# Patient Record
Sex: Female | Born: 1985 | Race: Black or African American | Hispanic: No | Marital: Single | Smoking: Never smoker
Health system: Southern US, Community
[De-identification: ages and names within clinical notes are randomized; demographics above are authoritative.]

## PROBLEM LIST (undated history)

## (undated) ENCOUNTER — Inpatient Hospital Stay (HOSPITAL_COMMUNITY): Payer: Self-pay

## (undated) DIAGNOSIS — K219 Gastro-esophageal reflux disease without esophagitis: Secondary | ICD-10-CM

## (undated) DIAGNOSIS — F419 Anxiety disorder, unspecified: Secondary | ICD-10-CM

## (undated) DIAGNOSIS — D649 Anemia, unspecified: Secondary | ICD-10-CM

## (undated) DIAGNOSIS — I499 Cardiac arrhythmia, unspecified: Secondary | ICD-10-CM

## (undated) HISTORY — DX: Anxiety disorder, unspecified: F41.9

## (undated) HISTORY — PX: HERNIA REPAIR: SHX51

---

## 2010-09-24 ENCOUNTER — Ambulatory Visit (HOSPITAL_COMMUNITY)
Admission: RE | Admit: 2010-09-24 | Discharge: 2010-09-24 | Payer: Self-pay | Source: Home / Self Care | Attending: Obstetrics | Admitting: Obstetrics

## 2011-01-16 ENCOUNTER — Inpatient Hospital Stay (INDEPENDENT_AMBULATORY_CARE_PROVIDER_SITE_OTHER)
Admission: RE | Admit: 2011-01-16 | Discharge: 2011-01-16 | Disposition: A | Discharge status: Home / Self Care | Payer: Medicaid Other | Source: Ambulatory Visit | Attending: Family Medicine | Admitting: Family Medicine

## 2011-01-16 DIAGNOSIS — K089 Disorder of teeth and supporting structures, unspecified: Secondary | ICD-10-CM

## 2011-01-16 DIAGNOSIS — Z331 Pregnant state, incidental: Secondary | ICD-10-CM

## 2011-02-09 ENCOUNTER — Other Ambulatory Visit (HOSPITAL_COMMUNITY): Payer: Medicaid Other

## 2011-02-11 ENCOUNTER — Other Ambulatory Visit (HOSPITAL_COMMUNITY): Payer: Medicaid Other

## 2011-02-15 ENCOUNTER — Other Ambulatory Visit: Payer: Self-pay | Admitting: Obstetrics

## 2011-02-15 ENCOUNTER — Encounter (HOSPITAL_COMMUNITY): Payer: Medicaid Other

## 2011-02-15 LAB — CBC
MCH: 26.5 pg (ref 26.0–34.0)
MCHC: 31 g/dL (ref 30.0–36.0)
MCV: 85.6 fL (ref 78.0–100.0)
Platelets: 123 10*3/uL — ABNORMAL LOW (ref 150–400)
RDW: 16.3 % — ABNORMAL HIGH (ref 11.5–15.5)

## 2011-02-15 LAB — SURGICAL PCR SCREEN: MRSA, PCR: NEGATIVE

## 2011-02-16 ENCOUNTER — Inpatient Hospital Stay (HOSPITAL_COMMUNITY)
Admission: RE | Admit: 2011-02-16 | Discharge: 2011-02-18 | DRG: 766 | Disposition: A | Discharge status: Home / Self Care | Payer: Medicaid Other | Source: Ambulatory Visit | Attending: Obstetrics | Admitting: Obstetrics

## 2011-02-16 DIAGNOSIS — Z01812 Encounter for preprocedural laboratory examination: Secondary | ICD-10-CM

## 2011-02-16 DIAGNOSIS — Z01818 Encounter for other preprocedural examination: Secondary | ICD-10-CM

## 2011-02-16 DIAGNOSIS — O34219 Maternal care for unspecified type scar from previous cesarean delivery: Principal | ICD-10-CM | POA: Diagnosis present

## 2011-02-17 LAB — CBC
HCT: 29.2 % — ABNORMAL LOW (ref 36.0–46.0)
Hemoglobin: 9.2 g/dL — ABNORMAL LOW (ref 12.0–15.0)
MCH: 26.7 pg (ref 26.0–34.0)
MCHC: 31.5 g/dL (ref 30.0–36.0)
MCV: 84.6 fL (ref 78.0–100.0)

## 2011-02-18 ENCOUNTER — Inpatient Hospital Stay (INDEPENDENT_AMBULATORY_CARE_PROVIDER_SITE_OTHER)
Admission: RE | Admit: 2011-02-18 | Discharge: 2011-02-18 | Disposition: A | Discharge status: Home / Self Care | Payer: Medicaid Other | Source: Ambulatory Visit | Attending: Family Medicine | Admitting: Family Medicine

## 2011-02-18 DIAGNOSIS — G8918 Other acute postprocedural pain: Secondary | ICD-10-CM

## 2011-02-23 NOTE — Discharge Summary (Signed)
  NAMEALLISON, Mia Wilkinson NO.:  [id number]  MEDICAL RECORD NO.:  [id number]  LOCATION:  9105                          FACILITY:  [facility]  PHYSICIAN:  Kathreen Cosier, M.D.DATE OF BIRTH:  10-Mar-1986  DATE OF ADMISSION:  02/16/2011 DATE OF DISCHARGE:                              DISCHARGE SUMMARY   The patient is a 25 year old gravida 3, para 2-0-2, EDC is February 22, 2011.  She had 2 previous C-sections and she was now at term and desired repeat C-section.  She underwent a repeat low-transverse cesarean section and had a female Apgar 8 and 9, weighing 6 pounds 2 ounces. Postoperatively, she did well.  Her hemoglobin was 9.2.  She wanted earlier discharge on the second postoperative day and she was discharged on the second postoperative day on Tylox for pain and lower extremity for contraception.  DISCHARGE DIAGNOSES:  Status post repeat low transverse cesarean section at term.          ______________________________ Kathreen Cosier, M.D.     BAM/MEDQ  D:  02/18/2011  T:  02/18/2011  Job:  [id number]  Electronically Signed by Francoise Ceo M.D. on 02/23/2011 09:01:10 AM

## 2011-02-23 NOTE — Op Note (Signed)
  NAMEANYELI, Mia Wilkinson NO.:  [id number]  MEDICAL RECORD NO.:  [id number]  LOCATION:  9105                          FACILITY:  [facility]  PHYSICIAN:  Kathreen Cosier, M.D.DATE OF BIRTH:  07-26-1986  DATE OF PROCEDURE:  02/16/2011 DATE OF DISCHARGE:                              OPERATIVE REPORT   PREOPERATIVE DIAGNOSIS:  Previous cesarean section x2 at term, desires repeat.  POSTOPERATIVE DIAGNOSIS:  Previous cesarean section x2 at term, desires repeat.  SURGEON:  Kathreen Cosier, MD  FIRST ASSISTANT:  Dr. Clearance Coots.  ANESTHESIA:  Spinal.  PROCEDURE:  The patient was placed in the operating room table in supine position after spinal administered.  Abdomen was prepped and draped. The bladder was emptied with Foley catheter.  Transverse suprapubic incision was made through the old scar, carried down to rectus fascia. Fascia cleanly incised through the length of incision.  Recti muscles retracted laterally.  Peritoneum was incised longitudinally.  Transverse incision was made in the visceral peritoneum above the bladder.  Bladder was mobilized inferiorly.  Transverse lower uterine was incision made. Fluid was clear.  Baby delivered from the LOA position.  A female Apgar 8 and 9 weighing 6 pounds 2 ounces.  The placenta was posterior and removed manually, sent to Labor and Delivery.  The uterine cavity was cleaned with dry laps.  Uterine incision was closed in one layer with continuous suture of #1 chromic.  Hemostasis satisfactory.  Bladder flap reattached 2-0 chromic.  Uterus well contracted.  Tubes and ovaries were normal.  Abdomen closed in layers.  Peritoneum continuous suture of 0 chromic.  Fascia continuous suture of 0 Dexon, skin closed with subcuticular stitch of 4-0 Monocryl.  Blood loss 700 mL.  The patient tolerated the procedure well, taken to recovery room in good condition.          ______________________________ Kathreen Cosier,  M.D.     BAM/MEDQ  D:  02/16/2011  T:  02/17/2011  Job:  [id number]  Electronically Signed by Francoise Ceo M.D. on 02/23/2011 09:01:07 AM

## 2011-03-28 ENCOUNTER — Other Ambulatory Visit: Payer: Self-pay | Admitting: Obstetrics

## 2011-03-28 DIAGNOSIS — N6311 Unspecified lump in the right breast, upper outer quadrant: Secondary | ICD-10-CM

## 2011-04-01 ENCOUNTER — Other Ambulatory Visit: Payer: Medicaid Other

## 2012-11-01 ENCOUNTER — Emergency Department (HOSPITAL_COMMUNITY)
Admission: EM | Admit: 2012-11-01 | Discharge: 2012-11-01 | Disposition: A | Discharge status: Home / Self Care | Payer: Self-pay | Attending: Emergency Medicine | Admitting: Emergency Medicine

## 2012-11-01 ENCOUNTER — Encounter (HOSPITAL_COMMUNITY): Payer: Self-pay

## 2012-11-01 DIAGNOSIS — B373 Candidiasis of vulva and vagina: Secondary | ICD-10-CM

## 2012-11-01 DIAGNOSIS — B3731 Acute candidiasis of vulva and vagina: Secondary | ICD-10-CM | POA: Insufficient documentation

## 2012-11-01 DIAGNOSIS — Z3202 Encounter for pregnancy test, result negative: Secondary | ICD-10-CM | POA: Insufficient documentation

## 2012-11-01 DIAGNOSIS — N899 Noninflammatory disorder of vagina, unspecified: Secondary | ICD-10-CM | POA: Insufficient documentation

## 2012-11-01 LAB — URINALYSIS, ROUTINE W REFLEX MICROSCOPIC
Bilirubin Urine: NEGATIVE
Glucose, UA: NEGATIVE mg/dL
Hgb urine dipstick: NEGATIVE
Ketones, ur: NEGATIVE mg/dL
Nitrite: NEGATIVE
Protein, ur: NEGATIVE mg/dL
Specific Gravity, Urine: 1.025 (ref 1.005–1.030)
Urobilinogen, UA: 1 mg/dL (ref 0.0–1.0)
pH: 7.5 (ref 5.0–8.0)

## 2012-11-01 LAB — URINE MICROSCOPIC-ADD ON

## 2012-11-01 LAB — WET PREP, GENITAL
Clue Cells Wet Prep HPF POC: NONE SEEN
Trich, Wet Prep: NONE SEEN
Yeast Wet Prep HPF POC: NONE SEEN

## 2012-11-01 LAB — POCT PREGNANCY, URINE: Preg Test, Ur: NEGATIVE

## 2012-11-01 MED ORDER — FLUCONAZOLE 150 MG PO TABS: 150.0000 mg | ORAL_TABLET | Freq: Once | ORAL | Status: AC

## 2012-11-01 MED ORDER — FLUCONAZOLE 200 MG PO TABS
200.0000 mg | ORAL_TABLET | Freq: Once | ORAL | Status: AC
  Administered 2012-11-01: 200 mg via ORAL
  Filled 2012-11-01: qty 1

## 2012-11-01 NOTE — ED Notes (Signed)
Pt states she has dysuria. Pt denies vaginal d/c. Pt states her perineal area is irritated. States she may have washed too hard there while in the shower. Pt states her pain is in her vagina area and increases whenever she uses the bathroom.

## 2012-11-01 NOTE — ED Notes (Signed)
Patient c/o lower abdominal pain and vaginal irritation x 3 days. Patient denies dysuria.

## 2012-11-01 NOTE — ED Provider Notes (Signed)
History    27 year old female with vaginal irritation. Gradual onset about 3 days ago. Patient describes a sensation of burning when she urinates, and pacer wires. No unusual vaginal bleeding or discharge. LMP was first week in January. Patient is sexually active. She has no particular concerns or possible STD. Denies abdominal or back pain. No fevers or chills. Denies or vomiting. CSN: [id number]  Arrival date & time 11/01/12  1332   First MD Initiated Contact with Patient 11/01/12 1520      Chief Complaint  Patient presents with  . Abdominal Pain  . vaginal irritation     (Consider location/radiation/quality/duration/timing/severity/associated sxs/prior treatment) HPI  History reviewed. No pertinent past medical history.  Past Surgical History  Procedure Laterality Date  . Cesarean section      x 3    Family History  Problem Relation Age of Onset  . Hypertension Mother     History  Substance Use Topics  . Smoking status: Never Smoker   . Smokeless tobacco: Never Used  . Alcohol Use: No    OB History   Grav Para Term Preterm Abortions TAB SAB Ect Mult Living                  Review of Systems  All systems reviewed and negative, other than as noted in HPI.   Allergies  Review of patient's allergies indicates no known allergies.  Home Medications   Current Outpatient Rx  Name  Route  Sig  Dispense  Refill  . OVER THE COUNTER MEDICATION   Oral   Take 1 tablet by mouth once. OTC Bacterial Vaginosis pill.           BP 102/71  Pulse 87  Temp(Src) 98.7 F (37.1 C) (Oral)  Resp 16  SpO2 100%  LMP 09/05/2012  Physical Exam  Nursing note and vitals reviewed. Constitutional: She appears well-developed and well-nourished. No distress.  HENT:  Head: Normocephalic and atraumatic.  Eyes: Conjunctivae are normal. Right eye exhibits no discharge. Left eye exhibits no discharge.  Neck: Neck supple.  Cardiovascular: Normal rate, regular rhythm and normal  heart sounds.  Exam reveals no gallop and no friction rub.   No murmur heard. Pulmonary/Chest: Effort normal and breath sounds normal. No respiratory distress.  Abdominal: Soft. She exhibits no distension. There is no tenderness.  Genitourinary:  Normal external female genitalia. No concerning lesions noted. Thick clumpy white discharge consistent with vulvovaginal candidiasis.   Musculoskeletal: She exhibits no edema and no tenderness.  Neurological: She is alert.  Skin: Skin is warm and dry.  Psychiatric: She has a normal mood and affect. Her behavior is normal. Thought content normal.    ED Course  Procedures (including critical care time)  Labs Reviewed  WET PREP, GENITAL - Abnormal; Notable for the following:    WBC, Wet Prep HPF POC MANY (*)    All other components within normal limits  URINALYSIS, ROUTINE W REFLEX MICROSCOPIC - Abnormal; Notable for the following:    APPearance CLOUDY (*)    Leukocytes, UA LARGE (*)    All other components within normal limits  GC/CHLAMYDIA PROBE AMP  URINE CULTURE  RPR  URINE MICROSCOPIC-ADD ON  POCT PREGNANCY, URINE   No results found.   1. Candidal vulvovaginitis       MDM  27 year old female with vaginal irritation. Exam consistent with a candidal although vaginitis. Patient did not specifically concerned for sexually transmitted disease. Referred from empiric treatment. Cultures were sent. Plan course of  Diflucan. Emergent return precautions discussed.        Raeford Razor, MD 11/03/12 [phone]

## 2012-11-02 LAB — GC/CHLAMYDIA PROBE AMP
CT Probe RNA: NEGATIVE
GC Probe RNA: NEGATIVE

## 2012-11-02 LAB — RPR: RPR Ser Ql: NONREACTIVE

## 2012-11-03 LAB — URINE CULTURE: Colony Count: 60000

## 2012-11-06 ENCOUNTER — Telehealth (HOSPITAL_COMMUNITY): Payer: Self-pay | Admitting: Emergency Medicine

## 2012-11-06 NOTE — ED Notes (Signed)
Pt called to check on STD results.  Pt informed results are negative.

## 2012-11-26 ENCOUNTER — Encounter (HOSPITAL_COMMUNITY): Payer: Self-pay | Admitting: Emergency Medicine

## 2012-11-26 ENCOUNTER — Emergency Department (HOSPITAL_COMMUNITY)
Admission: EM | Admit: 2012-11-26 | Discharge: 2012-11-26 | Disposition: A | Discharge status: Home / Self Care | Payer: Self-pay | Attending: Emergency Medicine | Admitting: Emergency Medicine

## 2012-11-26 DIAGNOSIS — K089 Disorder of teeth and supporting structures, unspecified: Secondary | ICD-10-CM | POA: Insufficient documentation

## 2012-11-26 DIAGNOSIS — K0889 Other specified disorders of teeth and supporting structures: Secondary | ICD-10-CM

## 2012-11-26 MED ORDER — HYDROCODONE-ACETAMINOPHEN 5-325 MG PO TABS: ORAL_TABLET | ORAL | Status: DC

## 2012-11-26 MED ORDER — PENICILLIN V POTASSIUM 500 MG PO TABS: 500.0000 mg | ORAL_TABLET | Freq: Four times a day (QID) | ORAL | Status: DC

## 2012-11-26 NOTE — ED Notes (Signed)
Patient reports dental pain x 2 weeks. Took motrin a few weeks ago and still having pain

## 2012-11-26 NOTE — ED Provider Notes (Signed)
History    This chart was scribed for non-physician practitioner working with Celene Kras, MD by ED Scribe, Burman Nieves. This patient was seen in room WTR7/WTR7 and the patient's care was started at 8:21 PM.   CSN: [id number]  Arrival date & time 11/26/12  1821   First MD Initiated Contact with Patient 11/26/12 2021      Chief Complaint  Patient presents with  . Dental Pain    (Consider location/radiation/quality/duration/timing/severity/associated sxs/prior treatment) Patient is a 27 y.o. female presenting with tooth pain. The history is provided by the patient.  Dental PainPrimary symptoms do not include fever. The symptoms began more than 1 week ago. The symptoms are worsening. The symptoms occur constantly.  Additional symptoms include: dental sensitivity to temperature, gum swelling and gum tenderness. Additional symptoms do not include: trismus, jaw pain, facial swelling, trouble swallowing, pain with swallowing, excessive salivation, dry mouth, taste disturbance, smell disturbance, drooling, ear pain, hearing loss, nosebleeds, swollen glands and goiter.   LYNANN DEMETRIUS is a 27 y.o. female who presents to the Emergency Department complaining of moderate constant dental pain onset the past two weeks. Pt reports she has been   History reviewed. No pertinent past medical history.  Past Surgical History  Procedure Laterality Date  . Cesarean section      x 3    Family History  Problem Relation Age of Onset  . Hypertension Mother     History  Substance Use Topics  . Smoking status: Never Smoker   . Smokeless tobacco: Never Used  . Alcohol Use: No    OB History   Grav Para Term Preterm Abortions TAB SAB Ect Mult Living                  Review of Systems  Constitutional: Negative for fever.  HENT: Negative for hearing loss, ear pain, nosebleeds, facial swelling, drooling and trouble swallowing.     Allergies  Review of patient's allergies indicates no known  allergies.  Home Medications   Current Outpatient Rx  Name  Route  Sig  Dispense  Refill  . HYDROcodone-acetaminophen (NORCO/VICODIN) 5-325 MG per tablet      Take one tablet every 6 hours as needed for pain   12 tablet   0   . penicillin v potassium (VEETID) 500 MG tablet   Oral   Take 1 tablet (500 mg total) by mouth 4 (four) times daily.   40 tablet   0     BP 105/66  Pulse 75  Temp(Src) 98.7 F (37.1 C) (Oral)  SpO2 100%  LMP 09/05/2012  Physical Exam  Nursing note and vitals reviewed. Constitutional: She is oriented to person, place, and time. She appears well-developed and well-nourished. No distress.  HENT:  Head: Normocephalic and atraumatic.  Mild swelling and redness at gumline  Eyes: EOM are normal. Pupils are equal, round, and reactive to light.  Neck: Normal range of motion. Neck supple.  No meningeal signs  Cardiovascular: Normal rate, regular rhythm and normal heart sounds.  Exam reveals no gallop and no friction rub.   No murmur heard. Pulmonary/Chest: Effort normal and breath sounds normal. No respiratory distress. She has no wheezes. She has no rales. She exhibits no tenderness.  Abdominal: Soft. Bowel sounds are normal. She exhibits no distension. There is no tenderness. There is no rebound and no guarding.  Musculoskeletal: Normal range of motion. She exhibits no edema and no tenderness.  5/5 strength throughout.   Neurological: She  is alert and oriented to person, place, and time. No cranial nerve deficit.  Skin: Skin is warm and dry. She is not diaphoretic. No erythema.  Psychiatric: She has a normal mood and affect.    ED Course  Procedures (including critical care time) DIAGNOSTIC STUDIES: Oxygen Saturation is 100% on room air, normal by my interpretation.    COORDINATION OF CARE: 8:22 PM Discussed ED treatment with pt and pt agrees.   Medications - No data to display Discharge Medication List as of 11/26/2012  8:19 PM    START taking  these medications   Details  HYDROcodone-acetaminophen (NORCO/VICODIN) 5-325 MG per tablet Take one tablet every 6 hours as needed for pain, Print    penicillin v potassium (VEETID) 500 MG tablet Take 1 tablet (500 mg total) by mouth 4 (four) times daily., Starting 11/26/2012, Until Discontinued, Print         Labs Reviewed - No data to display No results found.   1. Pain, dental       MDM  Redness and swelling at gumline, concerning for mild infection. No gross abscess.  Exam unconcerning for Ludwig's angina or spread of infection.  Will treat with penicillin and pain medicine.  Urged patient to follow-up with dentist.  Pt says she has a dentist she can contact.   I personally performed the services described in this documentation, which was scribed in my presence. The recorded information has been reviewed and is accurate.     Glade Nurse, PA-C 11/27/12 1622

## 2012-11-27 NOTE — ED Provider Notes (Signed)
Medical screening examination/treatment/procedure(s) were performed by non-physician practitioner and as supervising physician I was immediately available for consultation/collaboration.   Mayme Profeta R Lucrecia Mcphearson, MD 11/27/12 1626 

## 2012-12-17 ENCOUNTER — Encounter (HOSPITAL_COMMUNITY): Payer: Self-pay | Admitting: *Deleted

## 2012-12-17 ENCOUNTER — Emergency Department (HOSPITAL_COMMUNITY)
Admission: EM | Admit: 2012-12-17 | Discharge: 2012-12-17 | Disposition: A | Discharge status: Home / Self Care | Payer: Self-pay | Attending: Emergency Medicine | Admitting: Emergency Medicine

## 2012-12-17 DIAGNOSIS — K029 Dental caries, unspecified: Secondary | ICD-10-CM | POA: Insufficient documentation

## 2012-12-17 DIAGNOSIS — K089 Disorder of teeth and supporting structures, unspecified: Secondary | ICD-10-CM | POA: Insufficient documentation

## 2012-12-17 MED ORDER — OXYCODONE-ACETAMINOPHEN 5-325 MG PO TABS: 1.0000 | ORAL_TABLET | ORAL | Status: DC | PRN

## 2012-12-17 MED ORDER — PENICILLIN V POTASSIUM 500 MG PO TABS: 500.0000 mg | ORAL_TABLET | Freq: Four times a day (QID) | ORAL | Status: AC
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
## 2012-12-18 NOTE — ED Provider Notes (Signed)
Medical screening examination/treatment/procedure(s) were performed by non-physician practitioner and as supervising physician I was immediately available for consultation/collaboration.   Shelonda Saxe L Vauda Salvucci, MD 12/18/12 0747 

## 2013-02-14 ENCOUNTER — Emergency Department (HOSPITAL_COMMUNITY)
Admission: EM | Admit: 2013-02-14 | Discharge: 2013-02-14 | Disposition: A | Discharge status: Home / Self Care | Payer: Medicaid Other | Attending: Emergency Medicine | Admitting: Emergency Medicine

## 2013-02-14 ENCOUNTER — Encounter (HOSPITAL_COMMUNITY): Payer: Self-pay | Admitting: *Deleted

## 2013-02-14 DIAGNOSIS — R1310 Dysphagia, unspecified: Secondary | ICD-10-CM | POA: Insufficient documentation

## 2013-02-14 DIAGNOSIS — J029 Acute pharyngitis, unspecified: Secondary | ICD-10-CM | POA: Insufficient documentation

## 2013-02-14 DIAGNOSIS — R509 Fever, unspecified: Secondary | ICD-10-CM | POA: Insufficient documentation

## 2013-02-14 DIAGNOSIS — R52 Pain, unspecified: Secondary | ICD-10-CM | POA: Insufficient documentation

## 2013-02-14 DIAGNOSIS — R51 Headache: Secondary | ICD-10-CM | POA: Insufficient documentation

## 2013-02-14 MED ORDER — HYDROCODONE-ACETAMINOPHEN 7.5-325 MG/15ML PO SOLN
10.0000 mL | Freq: Once | ORAL | Status: AC
  Administered 2013-02-14: 10 mL via ORAL
  Filled 2013-02-14: qty 15

## 2013-02-14 MED ORDER — HYDROCODONE-ACETAMINOPHEN 7.5-325 MG/15ML PO SOLN: 10.0000 mL | Freq: Four times a day (QID) | ORAL | Status: DC | PRN

## 2013-02-14 NOTE — ED Notes (Signed)
Pt ambulatory to exam room with steady gait.  

## 2013-02-14 NOTE — ED Notes (Signed)
C/o sore throat; body feels weak

## 2013-02-14 NOTE — ED Provider Notes (Signed)
History  This chart was scribed for non-physician practitioner Magnus Sinning, PA-C working with Richardean Canal, MD, by Candelaria Stagers, ED Scribe. This patient was seen in room WTR5/WTR5 and the patient's care was started at 10:06 PM   CSN: [id number]  Arrival date & time 02/14/13  2037   First MD Initiated Contact with Patient 02/14/13 2152      Chief Complaint  Patient presents with  . Sore Throat     The history is provided by the patient. No language interpreter was used.   HPI Comments: Mia Wilkinson is a 27 y.o. female who presents to the Emergency Department complaining of a sore throat that started this morning and has gradually worsened.  Sore throat worse with swallowing.  Pt has also experienced a subjective fever, headache, and body aches.  Her temperature in the ED is 98.6.  She has not taken her temperature at home.  She denies congestion, ear pain, or sinus pain.  Denies nausea or vomiting.  Denies neck pain or stiffness.  Pt has taken nothing for the pain prior to arrival.  No prior history of Strep throat.   History reviewed. No pertinent past medical history.  Past Surgical History  Procedure Laterality Date  . Cesarean section      x 3    Family History  Problem Relation Age of Onset  . Hypertension Mother     History  Substance Use Topics  . Smoking status: Never Smoker   . Smokeless tobacco: Never Used  . Alcohol Use: No    OB History   Grav Para Term Preterm Abortions TAB SAB Ect Mult Living                  Review of Systems  Constitutional: Negative for fever.  HENT: Positive for sore throat.   Respiratory: Negative for cough.   Neurological: Positive for headaches.  All other systems reviewed and are negative.    Allergies  Review of patient's allergies indicates no known allergies.  Home Medications   Current Outpatient Rx  Name  Route  Sig  Dispense  Refill  . oxyCODONE-acetaminophen (PERCOCET/ROXICET) 5-325 MG per tablet    Oral   Take 1 tablet by mouth every 4 (four) hours as needed for pain.   10 tablet   0     BP 95/70  Pulse 110  Temp(Src) 98.6 F (37 C)  Resp 20  SpO2 98%  LMP 02/14/2013  Physical Exam  Nursing note and vitals reviewed. Constitutional: She is oriented to person, place, and time. She appears well-developed and well-nourished. No distress.  HENT:  Head: Normocephalic and atraumatic. No trismus in the jaw.  Right Ear: Tympanic membrane normal.  Left Ear: Tympanic membrane normal.  Mouth/Throat: Uvula is midline and mucous membranes are normal. Oropharyngeal exudate, posterior oropharyngeal edema and posterior oropharyngeal erythema present. No tonsillar abscesses.  Exudates.  Tonsils enlarged bilaterally.  Uvula midline.  Normal voice phonation, no drooling.    Eyes: EOM are normal.  Neck: Normal range of motion. Neck supple. No tracheal deviation present.  Cardiovascular: Normal rate, regular rhythm and normal heart sounds.   No murmur heard. Pulmonary/Chest: Effort normal and breath sounds normal. No respiratory distress. She has no wheezes. She has no rales.  Musculoskeletal: Normal range of motion.  Lymphadenopathy:    She has cervical adenopathy (anterior).  Neurological: She is alert and oriented to person, place, and time.  Skin: Skin is warm and dry.  Psychiatric:  She has a normal mood and affect. Her behavior is normal.    ED Course  Procedures  DIAGNOSTIC STUDIES: Oxygen Saturation is 98% on room air, normal by my interpretation.    COORDINATION OF CARE:  10:08 PM Discussed course of care with pt which includes.  Pt understands and agrees.   11:22 PM Recheck: Discussed negative strep.  Pt is drinking soda without difficulty.    Labs Reviewed  RAPID STREP SCREEN  CULTURE, GROUP A STREP   No results found.   No diagnosis found.    MDM  Patient presents with a sore throat that has been present since earlier today.  Patient able to swallow.  Afebrile.   No signs of Peritonsillar Abscess at this time.  Rapid strep negative.  Patient discharged home.  Return precautions given.  I personally performed the services described in this documentation, which was scribed in my presence. The recorded information has been reviewed and is accurate.         Pascal Lux [place], PA-C 02/15/13 0100

## 2013-02-16 LAB — CULTURE, GROUP A STREP

## 2013-02-16 NOTE — ED Provider Notes (Signed)
Medical screening examination/treatment/procedure(s) were performed by non-physician practitioner and as supervising physician I was immediately available for consultation/collaboration.   David H Yao, MD 02/16/13 1458 

## 2013-03-06 ENCOUNTER — Emergency Department (HOSPITAL_COMMUNITY)
Admission: EM | Admit: 2013-03-06 | Discharge: 2013-03-06 | Disposition: A | Discharge status: Home / Self Care | Payer: Medicaid Other | Attending: Emergency Medicine | Admitting: Emergency Medicine

## 2013-03-06 DIAGNOSIS — N898 Other specified noninflammatory disorders of vagina: Secondary | ICD-10-CM | POA: Insufficient documentation

## 2013-03-06 DIAGNOSIS — Z3202 Encounter for pregnancy test, result negative: Secondary | ICD-10-CM | POA: Insufficient documentation

## 2013-03-06 LAB — URINALYSIS, ROUTINE W REFLEX MICROSCOPIC
Glucose, UA: NEGATIVE mg/dL
pH: 7 (ref 5.0–8.0)

## 2013-03-06 LAB — WET PREP, GENITAL
Clue Cells Wet Prep HPF POC: NONE SEEN
Trich, Wet Prep: NONE SEEN

## 2013-03-06 LAB — URINE MICROSCOPIC-ADD ON

## 2013-03-06 LAB — POCT PREGNANCY, URINE: Preg Test, Ur: NEGATIVE

## 2013-03-06 NOTE — ED Provider Notes (Signed)
History    CSN: [id number] Arrival date & time 03/06/13  1100  First MD Initiated Contact with Patient 03/06/13 1118     No chief complaint on file.  (Consider location/radiation/quality/duration/timing/severity/associated sxs/prior Treatment) HPI  27 year old female presents for evaluations of vaginal discharge. Patient noticed gradual onset of vaginal discharge for the past 5 days. Describe discharge as a white discharge. States initially she did experiencing some burning sensation in her vagina but that has resolved after one day. She denies fever, chills, headache, abdominal pain, back pain, dysuria, hematuria, itch, pain with sexual activities, or rash. No prior history of STD. Patient is a Therapist, sports. Her last menstrual period was June 11.  No past medical history on file. Past Surgical History  Procedure Laterality Date  . Cesarean section      x 3   Family History  Problem Relation Age of Onset  . Hypertension Mother    History  Substance Use Topics  . Smoking status: Never Smoker   . Smokeless tobacco: Never Used  . Alcohol Use: No   OB History   Grav Para Term Preterm Abortions TAB SAB Ect Mult Living                 Review of Systems  Constitutional: Negative for fever.  Genitourinary: Positive for vaginal discharge.  Skin: Negative for rash.    Allergies  Review of patient's allergies indicates no known allergies.  Home Medications   Current Outpatient Rx  Name  Route  Sig  Dispense  Refill  . HYDROcodone-acetaminophen (HYCET) 7.5-325 mg/15 ml solution   Oral   Take 10 mLs by mouth 4 (four) times daily as needed for pain.   80 mL   0    LMP 02/14/2013 Physical Exam  Nursing note and vitals reviewed. Constitutional: She appears well-developed and well-nourished. No distress.  HENT:  Head: Normocephalic and atraumatic.  Eyes: Conjunctivae are normal.  Neck: Normal range of motion. Neck supple.  Cardiovascular: Normal rate and regular rhythm.    Pulmonary/Chest: Effort normal and breath sounds normal. She exhibits no tenderness.  Abdominal: Soft. There is no tenderness.  Genitourinary: Vagina normal and uterus normal. There is no rash or lesion on the right labia. There is no rash or lesion on the left labia. Cervix exhibits no motion tenderness and no discharge. Right adnexum displays no mass and no tenderness. Left adnexum displays no mass and no tenderness. No erythema, tenderness or bleeding around the vagina. No vaginal discharge (White curdly  discharge) found.  Chaperone present:  Lymphadenopathy:       Right: No inguinal adenopathy present.       Left: No inguinal adenopathy present.    ED Course  Procedures (including critical care time)  11:34 AM Vaginal discharge only.  Pelvic exam without evidence concerning for PID.  Is afebrile, VSS.    12:35 PM Patient's labs shows no evidence of bacterial vaginosis, yeast infection, or other significant finding. Her urine does show moderate amount of hemoglobin urine dipstick however patient has no CVA tenderness abdominal tenderness concerning for kidney stone or pyelonephritis. Urine culture sent. GC/Ch culture also sent. I gave patient options of prophylactic treatment for STD with Rocephin and Zithromax, patient declined. Return precautions discussed. All questions answered to patient's satisfaction.  Labs Reviewed  WET PREP, GENITAL - Abnormal; Notable for the following:    WBC, Wet Prep HPF POC FEW (*)    All other components within normal limits  URINALYSIS, ROUTINE W REFLEX  MICROSCOPIC - Abnormal; Notable for the following:    APPearance CLOUDY (*)    Specific Gravity, Urine 1.031 (*)    Hgb urine dipstick MODERATE (*)    Leukocytes, UA LARGE (*)    All other components within normal limits  URINE MICROSCOPIC-ADD ON - Abnormal; Notable for the following:    Squamous Epithelial / LPF FEW (*)    Bacteria, UA FEW (*)    All other components within normal limits   GC/CHLAMYDIA PROBE AMP  URINE CULTURE  POCT PREGNANCY, URINE   No results found. 1. Vaginal discharge     MDM  BP 115/68  Pulse 90  Temp(Src) 98.5 F (36.9 C) (Oral)  Resp 16  SpO2 99%  LMP 02/14/2013  I have reviewed nursing notes and vital signs.  I reviewed available ER/hospitalization records thought the EMR   Fayrene Helper, [place] 03/06/13 1239

## 2013-03-06 NOTE — Progress Notes (Signed)
Pt confirms pcp is [facility] [facility] (listed as OB GYN)  EPIC updated

## 2013-03-06 NOTE — ED Provider Notes (Signed)
Medical screening examination/treatment/procedure(s) were performed by non-physician practitioner and as supervising physician I was immediately available for consultation/collaboration.  Ethelda Chick, MD 03/06/13 1248

## 2013-03-07 LAB — GC/CHLAMYDIA PROBE AMP
CT Probe RNA: NEGATIVE
GC Probe RNA: NEGATIVE

## 2013-03-08 LAB — URINE CULTURE

## 2013-03-09 NOTE — Progress Notes (Signed)
Post ED Visit - Positive Culture Follow-up  Culture report reviewed by antimicrobial stewardship pharmacist: []  Wes Dulaney, Pharm.D., BCPS []  Celedonio Miyamoto, [address].D., BCPS []  Georgina Pillion, Pharm.D., BCPS []  [place], [place].D., BCPS, AAHIVP [x]  Estella Husk, Pharm.D., BCPS, AAHIVP  Positive Urine culture Colony count is inadequate and patient had no urinary symptoms.  No further patient follow-up is required at this time.  Estella Husk, Pharm.D., BCPS, AAHIVP Clinical Pharmacist Phone: [phone] or [phone] 03/09/2013, 1:34 PM

## 2013-04-29 ENCOUNTER — Emergency Department (HOSPITAL_COMMUNITY): Payer: Medicaid Other

## 2013-04-29 ENCOUNTER — Emergency Department (HOSPITAL_COMMUNITY)
Admission: EM | Admit: 2013-04-29 | Discharge: 2013-04-29 | Disposition: A | Discharge status: Home / Self Care | Payer: Medicaid Other | Attending: Emergency Medicine | Admitting: Emergency Medicine

## 2013-04-29 ENCOUNTER — Encounter (HOSPITAL_COMMUNITY): Payer: Self-pay | Admitting: Emergency Medicine

## 2013-04-29 DIAGNOSIS — N390 Urinary tract infection, site not specified: Secondary | ICD-10-CM | POA: Insufficient documentation

## 2013-04-29 DIAGNOSIS — O2390 Unspecified genitourinary tract infection in pregnancy, unspecified trimester: Secondary | ICD-10-CM | POA: Insufficient documentation

## 2013-04-29 DIAGNOSIS — A499 Bacterial infection, unspecified: Secondary | ICD-10-CM | POA: Insufficient documentation

## 2013-04-29 DIAGNOSIS — B9689 Other specified bacterial agents as the cause of diseases classified elsewhere: Secondary | ICD-10-CM | POA: Insufficient documentation

## 2013-04-29 DIAGNOSIS — R109 Unspecified abdominal pain: Secondary | ICD-10-CM | POA: Insufficient documentation

## 2013-04-29 DIAGNOSIS — N760 Acute vaginitis: Secondary | ICD-10-CM | POA: Insufficient documentation

## 2013-04-29 DIAGNOSIS — Z3490 Encounter for supervision of normal pregnancy, unspecified, unspecified trimester: Secondary | ICD-10-CM

## 2013-04-29 DIAGNOSIS — O9989 Other specified diseases and conditions complicating pregnancy, childbirth and the puerperium: Secondary | ICD-10-CM | POA: Insufficient documentation

## 2013-04-29 LAB — CBC WITH DIFFERENTIAL/PLATELET
Basophils Absolute: 0 10*3/uL (ref 0.0–0.1)
Eosinophils Absolute: 0.1 10*3/uL (ref 0.0–0.7)
Eosinophils Relative: 1 % (ref 0–5)
HCT: 40.1 % (ref 36.0–46.0)
Lymphocytes Relative: 20 % (ref 12–46)
MCH: 27.7 pg (ref 26.0–34.0)
MCV: 85.3 fL (ref 78.0–100.0)
Monocytes Absolute: 0.6 10*3/uL (ref 0.1–1.0)
RDW: 12.6 % (ref 11.5–15.5)
WBC: 9 10*3/uL (ref 4.0–10.5)

## 2013-04-29 LAB — BASIC METABOLIC PANEL
CO2: 27 mEq/L (ref 19–32)
Calcium: 9.1 mg/dL (ref 8.4–10.5)
Creatinine, Ser: 0.65 mg/dL (ref 0.50–1.10)
GFR calc non Af Amer: >90 mL/min (ref >90)
Glucose, Bld: 90 mg/dL (ref 70–99)

## 2013-04-29 LAB — URINE MICROSCOPIC-ADD ON

## 2013-04-29 LAB — URINALYSIS, ROUTINE W REFLEX MICROSCOPIC
Bilirubin Urine: NEGATIVE
Glucose, UA: NEGATIVE mg/dL
Hgb urine dipstick: NEGATIVE
Ketones, ur: NEGATIVE mg/dL
Protein, ur: NEGATIVE mg/dL
Urobilinogen, UA: 1 mg/dL (ref 0.0–1.0)

## 2013-04-29 LAB — WET PREP, GENITAL: Yeast Wet Prep HPF POC: NONE SEEN

## 2013-04-29 LAB — HEPATIC FUNCTION PANEL
Alkaline Phosphatase: 60 U/L (ref 39–117)
Total Bilirubin: 0.2 mg/dL — ABNORMAL LOW (ref 0.3–1.2)
Total Protein: 8.3 g/dL (ref 6.0–8.3)

## 2013-04-29 LAB — HCG, QUANTITATIVE, PREGNANCY: hCG, Beta Chain, Quant, S: 5555 m[IU]/mL — ABNORMAL HIGH (ref <5)

## 2013-04-29 MED ORDER — CLINDAMYCIN HCL 150 MG PO CAPS: 300.0000 mg | ORAL_CAPSULE | Freq: Two times a day (BID) | ORAL | Status: DC

## 2013-04-29 MED ORDER — METRONIDAZOLE 500 MG PO TABS: 500.0000 mg | ORAL_TABLET | Freq: Two times a day (BID) | ORAL | Status: DC

## 2013-04-29 MED ORDER — CLINDAMYCIN HCL 300 MG PO CAPS
300.0000 mg | ORAL_CAPSULE | Freq: Once | ORAL | Status: AC
  Administered 2013-04-29: 300 mg via ORAL
  Filled 2013-04-29: qty 1

## 2013-04-29 MED ORDER — METRONIDAZOLE 500 MG PO TABS
500.0000 mg | ORAL_TABLET | Freq: Once | ORAL | Status: DC
  Filled 2013-04-29: qty 1

## 2013-04-29 MED ORDER — CEPHALEXIN 500 MG PO CAPS: 500.0000 mg | ORAL_CAPSULE | Freq: Four times a day (QID) | ORAL | Status: DC

## 2013-04-29 MED ORDER — PRENATAL COMPLETE 14-0.4 MG PO TABS: 1.0000 | ORAL_TABLET | Freq: Every day | ORAL | Status: DC

## 2013-04-29 MED ORDER — CEPHALEXIN 500 MG PO CAPS
500.0000 mg | ORAL_CAPSULE | Freq: Once | ORAL | Status: AC
  Administered 2013-04-29: 500 mg via ORAL
  Filled 2013-04-29: qty 1

## 2013-04-29 NOTE — ED Provider Notes (Signed)
CSN: [id number]     Arrival date & time 04/29/13  1604 History   First MD Initiated Contact with Patient 04/29/13 1628     Chief Complaint  Patient presents with  . Abdominal Pain   (Consider location/radiation/quality/duration/timing/severity/associated sxs/prior Treatment) HPI Mia Wilkinson is a 27 y.o. female complaining of severe sharp 10/10 lower abdominal pain worsening over the last 2 weeks. She has been trying APAP with little relief. Pt denies N/V, fever, change in bowel habits, dysuria. Pt endorses frequency, and whitesh vaginal discharge.  Pt has had unprotected sex multiple partners. G3P3, LMP July 15,   History reviewed. No pertinent past medical history. Past Surgical History  Procedure Laterality Date  . Cesarean section      x 3   Family History  Problem Relation Age of Onset  . Hypertension Mother    History  Substance Use Topics  . Smoking status: Never Smoker   . Smokeless tobacco: Never Used  . Alcohol Use: No   OB History   Grav Para Term Preterm Abortions TAB SAB Ect Mult Living                 Review of Systems 10 systems reviewed and found to be negative, except as noted in the HPI   Allergies  Review of patient's allergies indicates no known allergies.  Home Medications  No current outpatient prescriptions on file. BP 114/71  Pulse 82  Temp(Src) 99.1 F (37.3 C) (Oral)  Resp 20  SpO2 100%  LMP 03/19/2013 Physical Exam  Nursing note and vitals reviewed. Constitutional: She is oriented to person, place, and time. She appears well-developed and well-nourished. No distress.  HENT:  Head: Normocephalic.  Mouth/Throat: Oropharynx is clear and moist.  Eyes: Conjunctivae and EOM are normal. Pupils are equal, round, and reactive to light.  Neck: Normal range of motion.  Cardiovascular: Normal rate, regular rhythm and intact distal pulses.   Pulmonary/Chest: Effort normal and breath sounds normal. No stridor. No respiratory distress. She has no  wheezes. She has no rales. She exhibits no tenderness.  Abdominal: Soft. Bowel sounds are normal. She exhibits no distension and no mass. There is tenderness. There is no rebound and no guarding.  Patient reports severe abdominal tenderness to palpation of the bilateral lower cord current however she appears very comfortable with deep palpation  Genitourinary: No vaginal discharge found.  Pelvic exam chaperoned by technician:  No rashes or lesions, no cervical motion or adnexal tenderness.  Musculoskeletal: Normal range of motion.  Neurological: She is alert and oriented to person, place, and time.  Psychiatric: She has a normal mood and affect.    ED Course  Procedures (including critical care time) Labs Review Labs Reviewed  WET PREP, GENITAL - Abnormal; Notable for the following:    Clue Cells Wet Prep HPF POC MANY (*)    WBC, Wet Prep HPF POC RARE (*)    All other components within normal limits  URINALYSIS, ROUTINE W REFLEX MICROSCOPIC - Abnormal; Notable for the following:    APPearance CLOUDY (*)    Leukocytes, UA SMALL (*)    All other components within normal limits  HEPATIC FUNCTION PANEL - Abnormal; Notable for the following:    Total Bilirubin 0.2 (*)    All other components within normal limits  PREGNANCY, URINE - Abnormal; Notable for the following:    Preg Test, Ur POSITIVE (*)    All other components within normal limits  URINE MICROSCOPIC-ADD ON - Abnormal; Notable  for the following:    Squamous Epithelial / LPF FEW (*)    All other components within normal limits  HCG, QUANTITATIVE, PREGNANCY - Abnormal; Notable for the following:    hCG, Beta Chain, Quant, S 5555 (*)    All other components within normal limits  GC/CHLAMYDIA PROBE AMP  CBC WITH DIFFERENTIAL  BASIC METABOLIC PANEL   Imaging Review US Ob Comp Less 14 Wks  04/29/2013   *RADIOLOGY REPORT*  Clinical Data: Pelvic pain, positive pregnancy test  OBSTETRIC <14 WK [place] AND TRANSVAGINAL OB US   Technique:  Both transabdominal and transvaginal ultrasound examinations were performed for complete evaluation of the gestation as well as the maternal uterus, adnexal regions, and pelvic cul-de-sac.  Transvaginal technique was performed to assess early pregnancy.  Comparison:  None.  Intrauterine gestational sac:  Visualized/normal in shape. Yolk sac: Visualized Embryo: Not visualized Cardiac Activity: Not visualized  MSD: 7 mm  5 w 3 d      [place] EDC: 12/27/13  Maternal uterus/adnexae: The ovaries are normal.  IMPRESSION: Intrauterine gestational sac and yolk sac but no fetal pole or cardiac activity yet identified.  Follow-up ultrasound is recommended in 10-14 days for assessment of appropriate pregnancy progression and dating purposes.   Original Report Authenticated By: Christiana Pellant, M.D.   US Ob Transvaginal  04/29/2013   *RADIOLOGY REPORT*  Clinical Data: Pelvic pain, positive pregnancy test  OBSTETRIC <14 WK [place] AND TRANSVAGINAL OB US  Technique:  Both transabdominal and transvaginal ultrasound examinations were performed for complete evaluation of the gestation as well as the maternal uterus, adnexal regions, and pelvic cul-de-sac.  Transvaginal technique was performed to assess early pregnancy.  Comparison:  None.  Intrauterine gestational sac:  Visualized/normal in shape. Yolk sac: Visualized Embryo: Not visualized Cardiac Activity: Not visualized  MSD: 7 mm  5 w 3 d      [place] EDC: 12/27/13  Maternal uterus/adnexae: The ovaries are normal.  IMPRESSION: Intrauterine gestational sac and yolk sac but no fetal pole or cardiac activity yet identified.  Follow-up ultrasound is recommended in 10-14 days for assessment of appropriate pregnancy progression and dating purposes.   Original Report Authenticated By: Christiana Pellant, M.D.    MDM   1. Pregnancy at early stage   2. UTI (lower urinary tract infection)   3. BV (bacterial vaginosis)    Filed Vitals:   04/29/13 1626 04/29/13 2013  BP: 114/71 114/68   Pulse: 82 76  Temp: 99.1 F (37.3 C) 97.7 F (36.5 C)  TempSrc: Oral Oral  Resp: 20 18  SpO2: 100% 99%     Mia Wilkinson is a 27 y.o. female  severe centralized lower abdominal pain urine pregnancy test is positive, patient's last menstrual period was proximally 6 weeks ago. Ultrasound ordered to rule out ectopic based on level of the patient's pain.  Ultrasound sees intrauterine gestational sac without fetal pole, normal ovaries.   Medications  cephALEXin (KEFLEX) capsule 500 mg (500 mg Oral Given 04/29/13 1824)  clindamycin (CLEOCIN) capsule 300 mg (300 mg Oral Given 04/29/13 2012)    Pt is hemodynamically stable, appropriate for, and amenable to discharge at this time. Pt verbalized understanding and agrees with care plan. All questions answered. Outpatient follow-up and specific return precautions discussed.    Discharge Medication List as of 04/29/2013  7:50 PM    START taking these medications   Details  clindamycin (CLEOCIN) 150 MG capsule Take 2 capsules (300 mg total) by mouth 2 (two) times daily.  May dispense as 150mg  capsules, Starting 04/29/2013, Until Discontinued, Print        Note: Portions of this report may have been transcribed using voice recognition software. Every effort was made to ensure accuracy; however, inadvertent computerized transcription errors may be present      Wynetta Emery, PA-C 05/01/13 0114

## 2013-04-29 NOTE — ED Notes (Signed)
Pt reports 10/10 centralized pelvic pain. Pt reports she has not had a period since 03/19/13. Pt denies N/V/D. Pt reports a clear vaginal discharge, however denies vaginal bleeding. Pt A/O x4. NAD.

## 2013-04-29 NOTE — ED Notes (Signed)
Bed: WA04 Expected date:  Expected time:  Means of arrival:  Comments: [employer]

## 2013-05-01 NOTE — ED Provider Notes (Signed)
Medical screening examination/treatment/procedure(s) were performed by non-physician practitioner and as supervising physician I was immediately available for consultation/collaboration.  Jiro Kiester L Anyra Kaufman, MD 05/01/13 2056 

## 2013-05-09 LAB — OB RESULTS CONSOLE ABO/RH: RH Type: POSITIVE

## 2013-05-09 LAB — OB RESULTS CONSOLE HEPATITIS B SURFACE ANTIGEN: HEP B S AG: NEGATIVE

## 2013-05-09 LAB — OB RESULTS CONSOLE HIV ANTIBODY (ROUTINE TESTING): HIV: NONREACTIVE

## 2013-05-09 LAB — OB RESULTS CONSOLE RUBELLA ANTIBODY, IGM: Rubella: IMMUNE

## 2013-05-09 LAB — OB RESULTS CONSOLE RPR: RPR: NONREACTIVE

## 2013-05-09 LAB — OB RESULTS CONSOLE ANTIBODY SCREEN: ANTIBODY SCREEN: NEGATIVE

## 2013-05-15 ENCOUNTER — Other Ambulatory Visit: Payer: Self-pay | Admitting: Obstetrics

## 2013-05-15 DIAGNOSIS — N63 Unspecified lump in unspecified breast: Secondary | ICD-10-CM

## 2013-05-21 ENCOUNTER — Other Ambulatory Visit: Payer: Medicaid Other

## 2013-06-05 ENCOUNTER — Other Ambulatory Visit: Payer: Medicaid Other

## 2013-07-12 ENCOUNTER — Encounter (HOSPITAL_COMMUNITY): Payer: Self-pay | Admitting: Emergency Medicine

## 2013-07-12 ENCOUNTER — Emergency Department (HOSPITAL_COMMUNITY)
Admission: EM | Admit: 2013-07-12 | Discharge: 2013-07-13 | Disposition: A | Discharge status: Home / Self Care | Payer: Medicaid Other | Attending: Emergency Medicine | Admitting: Emergency Medicine

## 2013-07-12 DIAGNOSIS — K089 Disorder of teeth and supporting structures, unspecified: Secondary | ICD-10-CM | POA: Insufficient documentation

## 2013-07-12 DIAGNOSIS — Z79899 Other long term (current) drug therapy: Secondary | ICD-10-CM | POA: Insufficient documentation

## 2013-07-12 DIAGNOSIS — O26899 Other specified pregnancy related conditions, unspecified trimester: Secondary | ICD-10-CM

## 2013-07-12 DIAGNOSIS — B9689 Other specified bacterial agents as the cause of diseases classified elsewhere: Secondary | ICD-10-CM

## 2013-07-12 DIAGNOSIS — O9989 Other specified diseases and conditions complicating pregnancy, childbirth and the puerperium: Secondary | ICD-10-CM | POA: Insufficient documentation

## 2013-07-12 DIAGNOSIS — N760 Acute vaginitis: Secondary | ICD-10-CM | POA: Insufficient documentation

## 2013-07-12 DIAGNOSIS — O2390 Unspecified genitourinary tract infection in pregnancy, unspecified trimester: Secondary | ICD-10-CM | POA: Insufficient documentation

## 2013-07-12 DIAGNOSIS — R1031 Right lower quadrant pain: Secondary | ICD-10-CM | POA: Insufficient documentation

## 2013-07-12 DIAGNOSIS — R1032 Left lower quadrant pain: Secondary | ICD-10-CM | POA: Insufficient documentation

## 2013-07-12 DIAGNOSIS — K029 Dental caries, unspecified: Secondary | ICD-10-CM | POA: Insufficient documentation

## 2013-07-12 LAB — URINALYSIS, ROUTINE W REFLEX MICROSCOPIC
Glucose, UA: NEGATIVE mg/dL
Ketones, ur: NEGATIVE mg/dL
Nitrite: NEGATIVE
Protein, ur: NEGATIVE mg/dL
pH: 6.5 (ref 5.0–8.0)

## 2013-07-12 LAB — HEPATIC FUNCTION PANEL
ALT: 11 U/L (ref 0–35)
AST: 17 U/L (ref 0–37)
Albumin: 3 g/dL — ABNORMAL LOW (ref 3.5–5.2)
Alkaline Phosphatase: 44 U/L (ref 39–117)
Bilirubin, Direct: <0.1 mg/dL (ref 0.0–0.3)
Total Bilirubin: 0.1 mg/dL — ABNORMAL LOW (ref 0.3–1.2)

## 2013-07-12 LAB — POCT I-STAT, CHEM 8
Calcium, Ion: 1.27 mmol/L — ABNORMAL HIGH (ref 1.12–1.23)
Creatinine, Ser: 0.8 mg/dL (ref 0.50–1.10)
HCT: 40 % (ref 36.0–46.0)
Hemoglobin: 13.6 g/dL (ref 12.0–15.0)
Sodium: 139 mEq/L (ref 135–145)
TCO2: 27 mmol/L (ref 0–100)

## 2013-07-12 LAB — CBC WITH DIFFERENTIAL/PLATELET
Basophils Absolute: 0 10*3/uL (ref 0.0–0.1)
Eosinophils Relative: 2 % (ref 0–5)
HCT: 36.9 % (ref 36.0–46.0)
Hemoglobin: 12.3 g/dL (ref 12.0–15.0)
Lymphocytes Relative: 22 % (ref 12–46)
MCV: 82.9 fL (ref 78.0–100.0)
Monocytes Absolute: 0.5 10*3/uL (ref 0.1–1.0)
Monocytes Relative: 6 % (ref 3–12)
Neutro Abs: 6.4 10*3/uL (ref 1.7–7.7)
Platelets: 179 10*3/uL (ref 150–400)
WBC: 9.2 10*3/uL (ref 4.0–10.5)

## 2013-07-12 LAB — WET PREP, GENITAL: Trich, Wet Prep: NONE SEEN

## 2013-07-12 LAB — URINE MICROSCOPIC-ADD ON

## 2013-07-12 LAB — PREGNANCY, URINE: Preg Test, Ur: POSITIVE — AB

## 2013-07-12 MED ORDER — MORPHINE SULFATE 4 MG/ML IJ SOLN
4.0000 mg | Freq: Once | INTRAMUSCULAR | Status: AC
  Administered 2013-07-12: 4 mg via INTRAVENOUS
  Filled 2013-07-12: qty 1

## 2013-07-12 MED ORDER — SODIUM CHLORIDE 0.9 % IV BOLUS (SEPSIS)
1000.0000 mL | Freq: Once | INTRAVENOUS | Status: AC
  Administered 2013-07-12: 1000 mL via INTRAVENOUS

## 2013-07-12 MED ORDER — METRONIDAZOLE 500 MG PO TABS
500.0000 mg | ORAL_TABLET | Freq: Once | ORAL | Status: AC
  Administered 2013-07-12: 500 mg via ORAL
  Filled 2013-07-12: qty 1

## 2013-07-12 NOTE — ED Notes (Signed)
Pt arrived to ED with a complaint of abdominal pain.  Pt is [redacted] weeks pregnant. [pt describes pain as sharp and located on either side of her mid abdomen

## 2013-07-12 NOTE — ED Notes (Addendum)
Pt present to ED with c/o bilateral lower abd "saharp pain"  Pt reports last obgyn visit 11/30 and "was normal"  Pt deneis n/v.  Pt reports pain on and off.  Pt denies bleeding

## 2013-07-12 NOTE — ED Provider Notes (Signed)
CSN: [id number]     Arrival date & time 07/12/13  2006 History   First MD Initiated Contact with Patient 07/12/13 2032     Chief Complaint  Patient presents with  . Abdominal Pain   (Consider location/radiation/quality/duration/timing/severity/associated sxs/prior Treatment) HPI  27 year old female G4 P4 currently [redacted] weeks pregnant presents complaining of abdominal pain. Reports gradual onset of pain to the left abdomen which started yesterday and has been radiating to the right abdomen. Pain is sharp, worsened when she lays flat and improved when she is on the side. Pain is 7/10, usually lasting for several minutes. Pain has been recurrent and not improved with taking Tylenol. No associated fever, chills, nausea, vomiting, diarrhea, chest pain, shortness of breath, productive cough, back pain, dysuria, hematuria, hematochezia, or melena. Denies any vaginal bleeding. Patient reports she has appetite but afraid to eat. Her last bowel movement was yesterday. Has had prior C-section. States she had an ultrasound performed in the ER when she had similar symptoms and was found to be [redacted] weeks pregnant. She has not had an official ultrasound yet.  Also report having dental pain for the past several months, intermittent, affect her lower teeth.  Admits to having dental decay and currently looking for dentist for further care.    History reviewed. No pertinent past medical history. Past Surgical History  Procedure Laterality Date  . Cesarean section      x 3   Family History  Problem Relation Age of Onset  . Hypertension Mother    History  Substance Use Topics  . Smoking status: Never Smoker   . Smokeless tobacco: Never Used  . Alcohol Use: No   OB History   Grav Para Term Preterm Abortions TAB SAB Ect Mult Living   1              Review of Systems  All other systems reviewed and are negative.    Allergies  Review of patient's allergies indicates no known allergies.  Home Medications    Current Outpatient Rx  Name  Route  Sig  Dispense  Refill  . acetaminophen (TYLENOL) 325 MG tablet   Oral   Take 650 mg by mouth every 6 (six) hours as needed (PAIN).         . Prenatal Vit-Fe Fumarate-FA (PRENATAL COMPLETE) 14-0.4 MG TABS   Oral   Take 1 tablet by mouth daily.   60 each   0    BP 108/72  Pulse 72  Temp(Src) 98.1 F (36.7 C) (Oral)  Resp 20  SpO2 97%  LMP 03/19/2013 Physical Exam  Nursing note and vitals reviewed. Constitutional: She appears well-developed and well-nourished. No distress.  HENT:  Head: Normocephalic and atraumatic.  Poor dentition with dental decay to molar and 2nd premolar bilaterally on lower jaw.  No obvious abscess.  Ttp.  No trismus  Eyes: Conjunctivae are normal.  Neck: Normal range of motion. Neck supple.  Cardiovascular: Normal rate and regular rhythm.   Pulmonary/Chest: Effort normal and breath sounds normal. She exhibits no tenderness.  Abdominal: Soft. There is tenderness (Diffuse abdominal tenderness primary to left and right lower abdomen without guarding or rebound tenderness. No Murphy sign, no McBurney's point.).  Genitourinary: Vagina normal and uterus normal. There is no rash or lesion on the right labia. There is no rash or lesion on the left labia. Cervix exhibits no motion tenderness and no discharge. Right adnexum displays no mass and no tenderness. Left adnexum displays tenderness (mild left adnexal  tenderness). Left adnexum displays no mass. No erythema, tenderness or bleeding around the vagina. No vaginal discharge found.  Chaperone present:  No CVA tenderness  Lymphadenopathy:       Right: No inguinal adenopathy present.       Left: No inguinal adenopathy present.    ED Course  Procedures (including critical care time)  10:22 PM Pt with lower abdominal pain.  Pain mostly to RLQ although diffused.  Is currently [redacted] weeks pregnant. Last document transvaginal US in august shows evidence of intrauterine  pregnancy.  Care discussed with attending who evaluate pt and felt it will be appropriate to r/o appy with abd MRI for further evaluation.     11:28 PM No significant discomfort with pelvic exam, no evidence of PID.  Wet prep shows many clue cells and pt has moderate vaginal discharge.  Will treat for BV with flagyl.  Pt has moderate WBC on wet prep.  Since minimal tenderness, GC/Ch cultures sent.  Will awaits for abd MRI.  If result negative, pt to f/u with OBGYN for further evaluation and for formal US.  Pt also report having dental pain.  Will give dental referral and d/c penicillin abx.    11:59 PM No MRI available at [facility] at this time.  [facility] has MRI available and will accept pt in case of extreme emergency.  I have reevaluate pt and her sxs is improving.  Pain more localized to LLQ and not RLQ. I offer option of MRI for further evaluation.  Pt felt more comfortable not having MRI at this time as her pain has improved and low suspicion of appy.  Plan to have pt return for serial abdominal exam, which she agrees.  Pt currently afebrile, normal WBC.    Labs Review Labs Reviewed  WET PREP, GENITAL - Abnormal; Notable for the following:    Clue Cells Wet Prep HPF POC MANY (*)    WBC, Wet Prep HPF POC MODERATE (*)    All other components within normal limits  URINALYSIS, ROUTINE W REFLEX MICROSCOPIC - Abnormal; Notable for the following:    APPearance CLOUDY (*)    Specific Gravity, Urine 1.034 (*)    Leukocytes, UA SMALL (*)    All other components within normal limits  PREGNANCY, URINE - Abnormal; Notable for the following:    Preg Test, Ur POSITIVE (*)    All other components within normal limits  HEPATIC FUNCTION PANEL - Abnormal; Notable for the following:    Albumin 3.0 (*)    Total Bilirubin 0.1 (*)    All other components within normal limits  URINE MICROSCOPIC-ADD ON - Abnormal; Notable for the following:    Squamous Epithelial / LPF MANY (*)    Bacteria, UA MANY (*)    All other  components within normal limits  POCT I-STAT, CHEM 8 - Abnormal; Notable for the following:    Calcium, Ion 1.27 (*)    All other components within normal limits  GC/CHLAMYDIA PROBE AMP  URINE CULTURE  CBC WITH DIFFERENTIAL   Imaging Review No results found.  EKG Interpretation   None       MDM   1. Pregnancy related abdominal pain of lower quadrant, antepartum   2. BV (bacterial vaginosis)    BP 108/72  Pulse 72  Temp(Src) 98.1 F (36.7 C) (Oral)  Resp 20  SpO2 97%  LMP 03/19/2013  I have reviewed nursing notes and vital signs. I reviewed available ER/hospitalization records thought the EMR  Fayrene Helper, PA-C 07/13/13 0006

## 2013-07-13 MED ORDER — PENICILLIN V POTASSIUM 500 MG PO TABS: 500.0000 mg | ORAL_TABLET | Freq: Three times a day (TID) | ORAL | Status: DC

## 2013-07-13 MED ORDER — METRONIDAZOLE 500 MG PO TABS: 500.0000 mg | ORAL_TABLET | Freq: Two times a day (BID) | ORAL | Status: DC

## 2013-07-13 MED ORDER — HYDROCODONE-ACETAMINOPHEN 5-325 MG PO TABS: 1.0000 | ORAL_TABLET | ORAL | Status: DC | PRN

## 2013-07-13 NOTE — ED Provider Notes (Signed)
Medical screening examination/treatment/procedure(s) were conducted as a shared visit with non-physician practitioner(s) and myself.  I personally evaluated the patient during the encounter.  EKG Interpretation   None       Patient who is [redacted] weeks pregnant with vague abd pain, worse in LLQ than RLQ. WIll get MRI to r/o appy  Audree Camel, MD 07/13/13 1100

## 2013-07-13 NOTE — ED Notes (Signed)
Pt verbalizes understanding.  Pt ambulate without difficulty

## 2013-07-14 LAB — URINE CULTURE: Culture: NO GROWTH

## 2013-07-15 LAB — GC/CHLAMYDIA PROBE AMP: GC Probe RNA: NEGATIVE

## 2013-07-16 ENCOUNTER — Other Ambulatory Visit (HOSPITAL_COMMUNITY): Payer: Self-pay | Admitting: Obstetrics

## 2013-07-16 DIAGNOSIS — Z3689 Encounter for other specified antenatal screening: Secondary | ICD-10-CM

## 2013-07-16 NOTE — ED Notes (Signed)
+   Chlamydia Chart sent to EDP for review. 

## 2013-07-22 ENCOUNTER — Telehealth (HOSPITAL_COMMUNITY): Payer: Self-pay | Admitting: Emergency Medicine

## 2013-07-22 NOTE — ED Notes (Signed)
Chart returned from EDP office. Per Santiago Glad PA-C, Azithromycin 500 mg tablets. Sig: Take 2 tablets once. #2.

## 2013-07-30 ENCOUNTER — Ambulatory Visit (HOSPITAL_COMMUNITY): Admission: RE | Admit: 2013-07-30 | Payer: Medicaid Other | Source: Ambulatory Visit

## 2013-07-30 ENCOUNTER — Other Ambulatory Visit (HOSPITAL_COMMUNITY): Payer: Self-pay | Admitting: Obstetrics

## 2013-07-30 ENCOUNTER — Ambulatory Visit (HOSPITAL_COMMUNITY)
Admission: RE | Admit: 2013-07-30 | Discharge: 2013-07-30 | Disposition: A | Discharge status: Home / Self Care | Payer: Medicaid Other | Source: Ambulatory Visit | Attending: Obstetrics | Admitting: Obstetrics

## 2013-07-30 DIAGNOSIS — Z3689 Encounter for other specified antenatal screening: Secondary | ICD-10-CM

## 2013-07-30 DIAGNOSIS — O34219 Maternal care for unspecified type scar from previous cesarean delivery: Secondary | ICD-10-CM | POA: Insufficient documentation

## 2013-12-04 ENCOUNTER — Other Ambulatory Visit: Payer: Self-pay | Admitting: Obstetrics

## 2013-12-04 ENCOUNTER — Encounter (HOSPITAL_COMMUNITY): Payer: Self-pay | Admitting: Emergency Medicine

## 2013-12-04 ENCOUNTER — Emergency Department (HOSPITAL_COMMUNITY)
Admission: EM | Admit: 2013-12-04 | Discharge: 2013-12-04 | Disposition: A | Discharge status: Home / Self Care | Payer: Medicaid Other | Attending: Emergency Medicine | Admitting: Emergency Medicine

## 2013-12-04 DIAGNOSIS — R42 Dizziness and giddiness: Secondary | ICD-10-CM | POA: Insufficient documentation

## 2013-12-04 DIAGNOSIS — O212 Late vomiting of pregnancy: Secondary | ICD-10-CM | POA: Insufficient documentation

## 2013-12-04 DIAGNOSIS — R112 Nausea with vomiting, unspecified: Secondary | ICD-10-CM

## 2013-12-04 DIAGNOSIS — Z79899 Other long term (current) drug therapy: Secondary | ICD-10-CM | POA: Insufficient documentation

## 2013-12-04 DIAGNOSIS — O9989 Other specified diseases and conditions complicating pregnancy, childbirth and the puerperium: Secondary | ICD-10-CM | POA: Insufficient documentation

## 2013-12-04 DIAGNOSIS — Z3490 Encounter for supervision of normal pregnancy, unspecified, unspecified trimester: Secondary | ICD-10-CM

## 2013-12-04 LAB — CBC WITH DIFFERENTIAL/PLATELET
Basophils Absolute: 0 10*3/uL (ref 0.0–0.1)
Basophils Relative: 0 % (ref 0–1)
Eosinophils Absolute: 0 10*3/uL (ref 0.0–0.7)
Eosinophils Relative: 0 % (ref 0–5)
HCT: 36.2 % (ref 36.0–46.0)
Hemoglobin: 11.5 g/dL — ABNORMAL LOW (ref 12.0–15.0)
Lymphocytes Relative: 4 % — ABNORMAL LOW (ref 12–46)
Lymphs Abs: 0.4 10*3/uL — ABNORMAL LOW (ref 0.7–4.0)
MCH: 27.1 pg (ref 26.0–34.0)
MCHC: 31.8 g/dL (ref 30.0–36.0)
MCV: 85.2 fL (ref 78.0–100.0)
Monocytes Absolute: 0.3 10*3/uL (ref 0.1–1.0)
Monocytes Relative: 3 % (ref 3–12)
Neutro Abs: 8.3 10*3/uL — ABNORMAL HIGH (ref 1.7–7.7)
Neutrophils Relative %: 93 % — ABNORMAL HIGH (ref 43–77)
Platelets: 131 10*3/uL — ABNORMAL LOW (ref 150–400)
RBC: 4.25 MIL/uL (ref 3.87–5.11)
RDW: 15.6 % — ABNORMAL HIGH (ref 11.5–15.5)
WBC: 8.9 10*3/uL (ref 4.0–10.5)

## 2013-12-04 LAB — BASIC METABOLIC PANEL
BUN: 11 mg/dL (ref 6–23)
CO2: 23 mEq/L (ref 19–32)
Calcium: 8.6 mg/dL (ref 8.4–10.5)
Chloride: 99 mEq/L (ref 96–112)
Creatinine, Ser: 0.5 mg/dL (ref 0.50–1.10)
GFR calc Af Amer: >90 mL/min (ref >90)
GFR calc non Af Amer: >90 mL/min (ref >90)
Glucose, Bld: 101 mg/dL — ABNORMAL HIGH (ref 70–99)
Potassium: 3.6 mEq/L — ABNORMAL LOW (ref 3.7–5.3)
Sodium: 136 mEq/L — ABNORMAL LOW (ref 137–147)

## 2013-12-04 MED ORDER — SODIUM CHLORIDE 0.9 % IV SOLN
1000.0000 mL | INTRAVENOUS | Status: DC
  Administered 2013-12-04: 1000 mL via INTRAVENOUS

## 2013-12-04 MED ORDER — SODIUM CHLORIDE 0.9 % IV SOLN
1000.0000 mL | Freq: Once | INTRAVENOUS | Status: AC
  Administered 2013-12-04: 1000 mL via INTRAVENOUS

## 2013-12-04 MED ORDER — ONDANSETRON HCL 4 MG/2ML IJ SOLN
4.0000 mg | Freq: Once | INTRAMUSCULAR | Status: AC
  Administered 2013-12-04: 4 mg via INTRAVENOUS
  Filled 2013-12-04: qty 2

## 2013-12-04 NOTE — ED Provider Notes (Signed)
CSN: [id number]     Arrival date & time 12/04/13  1357 History   First MD Initiated Contact with Patient 12/04/13 1439     Chief Complaint  Patient presents with  . Emesis     (Consider location/radiation/quality/duration/timing/severity/associated sxs/prior Treatment) Patient is a 28 y.o. female presenting with vomiting. The history is provided by the patient.  Emesis Associated symptoms: no abdominal pain, no chills, no diarrhea, no headaches and no sore throat   pt G4P3, [redacted] weeks pregnant, is scheduled for c section 4/15.  +pnc (gyn Dr Gaynell Face), no complications w preg, states prior ultrasounds including a couple months ago/normal.  States was checked in office last week,and was also seen there today w nv, given zofran.  Pt states nv persisted so presented here. Emesis clear, not bloody or bilious. No diarrhea. No abd or pelvic pain. No back pain.  No vaginal bleeding, fluid, or discharge.  States urine seemed dark/concentrated. No dysuria or urgency. Denies fever or chills.  States son had nvd illness in past 1-2 days. No recent travel, no known bad food ingestion.      Past Medical History  Diagnosis Date  . Medical history non-contributory    Past Surgical History  Procedure Laterality Date  . Cesarean section      x 3   Family History  Problem Relation Age of Onset  . Hypertension Mother    History  Substance Use Topics  . Smoking status: Never Smoker   . Smokeless tobacco: Never Used  . Alcohol Use: No   OB History   Grav Para Term Preterm Abortions TAB SAB Ect Mult Living   4 3 3  0 0 0 0 0 0 3     Review of Systems  Constitutional: Negative for fever and chills.  HENT: Negative for sore throat.   Eyes: Negative for redness.  Respiratory: Negative for cough and shortness of breath.   Cardiovascular: Negative for chest pain and leg swelling.  Gastrointestinal: Positive for nausea and vomiting. Negative for abdominal pain, diarrhea and constipation.   Genitourinary: Negative for dysuria and flank pain.  Musculoskeletal: Negative for back pain and neck pain.  Skin: Negative for rash.  Neurological: Positive for light-headedness. Negative for headaches.  Hematological: Does not bruise/bleed easily.  Psychiatric/Behavioral: Negative for confusion.      Allergies  Review of patient's allergies indicates no known allergies.  Home Medications   Current Outpatient Rx  Name  Route  Sig  Dispense  Refill  . Prenatal Vit-Fe Fumarate-FA (PRENATAL COMPLETE) 14-0.4 MG TABS   Oral   Take 1 tablet by mouth daily.   60 each   0    BP 108/66  Pulse 125  Temp(Src) 97.5 F (36.4 C) (Oral)  Resp 18  SpO2 100%  LMP 03/19/2013 Physical Exam  Nursing note and vitals reviewed. Constitutional: She is oriented to person, place, and time. She appears well-developed and well-nourished. No distress.  HENT:  Mouth/Throat: Oropharynx is clear and moist.  Eyes: Conjunctivae are normal. No scleral icterus.  Neck: Neck supple. No tracheal deviation present.  Cardiovascular: Regular rhythm, normal heart sounds and intact distal pulses.   Pulmonary/Chest: Effort normal and breath sounds normal. No respiratory distress.  Abdominal: Soft. Normal appearance. She exhibits no mass. There is no tenderness. There is no rebound and no guarding.  Gravid uterus, fundal ht c/w dates.   Genitourinary:  No cva tenderness  Musculoskeletal: She exhibits no edema and no tenderness.  Neurological: She is alert and oriented  to person, place, and time.  Skin: Skin is warm and dry. No rash noted. She is not diaphoretic.  Psychiatric: She has a normal mood and affect.    ED Course  Procedures (including critical care time)  Results for orders placed during the hospital encounter of 12/04/13  CBC WITH DIFFERENTIAL      Result Value Ref Range   WBC 8.9  4.0 - 10.5 K/uL   RBC 4.25  3.87 - 5.11 MIL/uL   Hemoglobin 11.5 (*) 12.0 - 15.0 g/dL   HCT 95.636.2  21.336.0 - 08.646.0  %   MCV 85.2  78.0 - 100.0 fL   MCH 27.1  26.0 - 34.0 pg   MCHC 31.8  30.0 - 36.0 g/dL   RDW 57.815.6 (*) 46.911.5 - 62.915.5 %   Platelets 131 (*) 150 - 400 K/uL   Neutrophils Relative % 93 (*) 43 - 77 %   Neutro Abs 8.3 (*) 1.7 - 7.7 K/uL   Lymphocytes Relative 4 (*) 12 - 46 %   Lymphs Abs 0.4 (*) 0.7 - 4.0 K/uL   Monocytes Relative 3  3 - 12 %   Monocytes Absolute 0.3  0.1 - 1.0 K/uL   Eosinophils Relative 0  0 - 5 %   Eosinophils Absolute 0.0  0.0 - 0.7 K/uL   Basophils Relative 0  0 - 1 %   Basophils Absolute 0.0  0.0 - 0.1 K/uL  BASIC METABOLIC PANEL      Result Value Ref Range   Sodium 136 (*) 137 - 147 mEq/L   Potassium 3.6 (*) 3.7 - 5.3 mEq/L   Chloride 99  96 - 112 mEq/L   CO2 23  19 - 32 mEq/L   Glucose, Bld 101 (*) 70 - 99 mg/dL   BUN 11  6 - 23 mg/dL   Creatinine, Ser 5.280.50  0.50 - 1.10 mg/dL   Calcium 8.6  8.4 - 41.310.5 mg/dL   GFR calc non Af Amer >90  >90 mL/min   GFR calc Af Amer >90  >90 mL/min       MDM  Iv ns bolus.   Labs sent by staff.   Rapid response ob/gyn nurse at bedside, pt on monitor.   Recheck hr 102, no pain or cramping. abd soft nt. Eating crackings, drinking water.  On fetal monitoring - ob/gyn rr nurse states no contractions, hr 140-150, normal tracing.  They discussed pt with her ob/gyn, Dr Gaynell FaceMarshall, who states d/c to home, they will f/u.     Suzi RootsKevin E Kaysea Raya, MD 12/04/13 1640

## 2013-12-04 NOTE — ED Notes (Signed)
Per pt, she has been vomiting since 2 this morning.  Pt is [redacted] weeks pregnant.  Pt was seen by MD at 1:15 pm and given script for nausea meds and sent home.  Pt was told by the RN that her urine was dark and that she needed fluids. Pt has increased pressure in pelvis with standing and states that her back pain is more than normal.  Pt did not fill script.  Pt was driving away from MD office and states she felt as if she was going to pass out, so she came here.  Due Date April 15th.  Scheduled c-section d/t three prior.

## 2013-12-04 NOTE — Discharge Instructions (Signed)
Rest. Drink plenty of fluids. Follow up with your ob/gyn as planned. [facility] if worse, contractions, abdominal or pelvic pain, vaginal bleeding, other concern.    Nausea and Vomiting Nausea is a sick feeling that often comes before throwing up (vomiting). Vomiting is a reflex where stomach contents come out of your mouth. Vomiting can cause severe loss of body fluids (dehydration). Children and elderly adults can become dehydrated quickly, especially if they also have diarrhea. Nausea and vomiting are symptoms of a condition or disease. It is important to find the cause of your symptoms. CAUSES   Direct irritation of the stomach lining. This irritation can result from increased acid production (gastroesophageal reflux disease), infection, food poisoning, taking certain medicines (such as nonsteroidal anti-inflammatory drugs), alcohol use, or tobacco use.  Signals from the brain.These signals could be caused by a headache, heat exposure, an inner ear disturbance, increased pressure in the brain from injury, infection, a tumor, or a concussion, pain, emotional stimulus, or metabolic problems.  An obstruction in the gastrointestinal tract (bowel obstruction).  Illnesses such as diabetes, hepatitis, gallbladder problems, appendicitis, kidney problems, cancer, sepsis, atypical symptoms of a heart attack, or eating disorders.  Medical treatments such as chemotherapy and radiation.  Receiving medicine that makes you sleep (general anesthetic) during surgery. DIAGNOSIS Your caregiver may ask for tests to be done if the problems do not improve after a few days. Tests may also be done if symptoms are severe or if the reason for the nausea and vomiting is not clear. Tests may include:  Urine tests.  Blood tests.  Stool tests.  Cultures (to look for evidence of infection).  X-rays or other imaging studies. Test results can help your caregiver make decisions about treatment or  the need for additional tests. TREATMENT You need to stay well hydrated. Drink frequently but in small amounts.You may wish to drink water, sports drinks, clear broth, or eat frozen ice pops or gelatin dessert to help stay hydrated.When you eat, eating slowly may help prevent nausea.There are also some antinausea medicines that may help prevent nausea. HOME CARE INSTRUCTIONS   Take all medicine as directed by your caregiver.  If you do not have an appetite, do not force yourself to eat. However, you must continue to drink fluids.  If you have an appetite, eat a normal diet unless your caregiver tells you differently.  Eat a variety of complex carbohydrates (rice, wheat, potatoes, bread), lean meats, yogurt, fruits, and vegetables.  Avoid high-fat foods because they are more difficult to digest.  Drink enough water and fluids to keep your urine clear or pale yellow.  If you are dehydrated, ask your caregiver for specific rehydration instructions. Signs of dehydration may include:  Severe thirst.  Dry lips and mouth.  Dizziness.  Dark urine.  Decreasing urine frequency and amount.  Confusion.  Rapid breathing or pulse. SEEK IMMEDIATE MEDICAL CARE IF:   You have blood or brown flecks (like coffee grounds) in your vomit.  You have black or bloody stools.  You have a severe headache or stiff neck.  You are confused.  You have severe abdominal pain.  You have chest pain or trouble breathing.  You do not urinate at least once every 8 hours.  You develop cold or clammy skin.  You continue to vomit for longer than 24 to 48 hours.  You have a fever. MAKE SURE YOU:   Understand these instructions.  Will watch your condition.  Will get help right  away if you are not doing well or get worse. Document Released: 08/22/2005 Document Revised: 11/14/2011 Document Reviewed: 01/19/2011 [facility] Patient Information 2014 [place], [place].

## 2013-12-04 NOTE — ED Provider Notes (Signed)
MSE was initiated and I personally evaluated the patient and placed orders (if any) at  2:40 PM on December 04, 2013.  The patient appears stable so that the remainder of the MSE may be completed by another provider.  Patient with nausea and vomiting.  No diarrhea.  She has had some intermittent low back pain has been cramping in nature.  She's had no vaginal loss of fluids.  No vaginal spotting or bleeding.  No reported bloody show.  Patient is [redacted] weeks pregnant.  Her due date is April 15.  She's a G2 P1.  IV fluids now.  Zofran now.  Cardiac monitor.  Tocometry.  Fetal monitoring.  Patient still feels the baby moving  Lyanne CoKevin M Deasha Clendenin, MD 12/04/13 [id number]

## 2013-12-04 NOTE — Progress Notes (Addendum)
1457  Arrived to evaluate this G4 P3 @ 37.[redacted]wks GA with c/o nausea/ vomiting since 2 AM. Reports seen by OB MD today for prenatal visit.  Informed OB of n/v and was given prescription for nausea medicine.  After leaving OB office reports feeling dizzy while driving.  Drove to ED instead of home.  Also reports some intermittent back pain and cramping.  Patient has a history of C/S X 3.  1550  Spoke with Dr. Gaynell FaceMarshall regarding patient, informed of complaint, and of EFM.  Advises 1L IVF (already in progress per EDP orders).  Patient is OB cleared.

## 2013-12-10 ENCOUNTER — Encounter (HOSPITAL_COMMUNITY): Payer: Self-pay | Admitting: Pharmacist

## 2013-12-11 ENCOUNTER — Inpatient Hospital Stay (HOSPITAL_COMMUNITY)
Admission: AD | Admit: 2013-12-11 | Discharge: 2013-12-12 | Disposition: A | Discharge status: Home / Self Care | Payer: Medicaid Other | Source: Ambulatory Visit | Attending: Obstetrics | Admitting: Obstetrics

## 2013-12-11 DIAGNOSIS — K089 Disorder of teeth and supporting structures, unspecified: Secondary | ICD-10-CM | POA: Insufficient documentation

## 2013-12-11 DIAGNOSIS — K047 Periapical abscess without sinus: Secondary | ICD-10-CM

## 2013-12-11 DIAGNOSIS — O99891 Other specified diseases and conditions complicating pregnancy: Secondary | ICD-10-CM | POA: Insufficient documentation

## 2013-12-11 DIAGNOSIS — O9989 Other specified diseases and conditions complicating pregnancy, childbirth and the puerperium: Principal | ICD-10-CM

## 2013-12-11 DIAGNOSIS — Z8249 Family history of ischemic heart disease and other diseases of the circulatory system: Secondary | ICD-10-CM | POA: Insufficient documentation

## 2013-12-12 LAB — URINE MICROSCOPIC-ADD ON

## 2013-12-12 LAB — URINALYSIS, ROUTINE W REFLEX MICROSCOPIC
Bilirubin Urine: NEGATIVE
GLUCOSE, UA: NEGATIVE mg/dL
Hgb urine dipstick: NEGATIVE
Ketones, ur: NEGATIVE mg/dL
Nitrite: NEGATIVE
Protein, ur: NEGATIVE mg/dL
SPECIFIC GRAVITY, URINE: 1.025 (ref 1.005–1.030)
Urobilinogen, UA: 1 mg/dL (ref 0.0–1.0)
pH: 6 (ref 5.0–8.0)

## 2013-12-12 MED ORDER — PENICILLIN V POTASSIUM 500 MG PO TABS
500.0000 mg | ORAL_TABLET | Freq: Once | ORAL | Status: AC
Start: 2013-12-12 — End: 2013-12-12
  Administered 2013-12-12: 500 mg via ORAL
  Filled 2013-12-12: qty 1

## 2013-12-12 MED ORDER — PENICILLIN V POTASSIUM 500 MG PO TABS
500.0000 mg | ORAL_TABLET | Freq: Four times a day (QID) | ORAL | Status: DC
Start: 2013-12-12 — End: 2013-12-18

## 2013-12-12 MED ORDER — OXYCODONE-ACETAMINOPHEN 5-325 MG PO TABS: 1.0000 | ORAL_TABLET | Freq: Four times a day (QID) | ORAL | Status: DC | PRN

## 2013-12-12 MED ORDER — OXYCODONE-ACETAMINOPHEN 5-325 MG PO TABS
2.0000 | ORAL_TABLET | Freq: Once | ORAL | Status: AC
  Administered 2013-12-12: 2 via ORAL
  Filled 2013-12-12: qty 2

## 2013-12-12 NOTE — Discharge Instructions (Signed)
Dental Care and Dentist Visits Dental care supports good overall health. Regular dental visits can also help you avoid dental pain, bleeding, infection, and other more serious health problems in the future. It is important to keep the mouth healthy because diseases in the teeth, gums, and other oral tissues can spread to other areas of the body. Some problems, such as diabetes, heart disease, and pre-term labor have been associated with poor oral health.  See your dentist every 6 months. If you experience emergency problems such as a toothache or broken tooth, go to the dentist right away. If you see your dentist regularly, you may catch problems early. It is easier to be treated for problems in the early stages.  WHAT TO EXPECT AT A DENTIST VISIT  Your dentist will look for many common oral health problems and recommend proper treatment. At your regular dental visit, you can expect:  Gentle cleaning of the teeth and gums. This includes scraping and polishing. This helps to remove the sticky substance around the teeth and gums (plaque). Plaque forms in the mouth shortly after eating. Over time, plaque hardens on the teeth as tartar. If tartar is not removed regularly, it can cause problems. Cleaning also helps remove stains.  Periodic X-rays. These pictures of the teeth and supporting bone will help your dentist assess the health of your teeth.  Periodic fluoride treatments. Fluoride is a natural mineral shown to help strengthen teeth. Fluoride treatmentinvolves applying a fluoride gel or varnish to the teeth. It is most commonly done in children.  Examination of the mouth, tongue, jaws, teeth, and gums to look for any oral health problems, such as:  Cavities (dental caries). This is decay on the tooth caused by plaque, sugar, and acid in the mouth. It is best to catch a cavity when it is small.  Inflammation of the gums caused by plaque buildup (gingivitis).  Problems with the mouth or malformed  or misaligned teeth.  Oral cancer or other diseases of the soft tissues or jaws. KEEP YOUR TEETH AND GUMS HEALTHY For healthy teeth and gums, follow these general guidelines as well as your dentist's specific advice:  Have your teeth professionally cleaned at the dentist every 6 months.  Brush twice daily with a fluoride toothpaste.  Floss your teeth daily.  Ask your dentist if you need fluoride supplements, treatments, or fluoride toothpaste.  Eat a healthy diet. Reduce foods and drinks with added sugar.  Avoid smoking. TREATMENT FOR ORAL HEALTH PROBLEMS If you have oral health problems, treatment varies depending on the conditions present in your teeth and gums.  Your caregiver will most likely recommend good oral hygiene at each visit.  For cavities, gingivitis, or other oral health disease, your caregiver will perform a procedure to treat the problem. This is typically done at a separate appointment. Sometimes your caregiver will refer you to another dental specialist for specific tooth problems or for surgery. SEEK IMMEDIATE DENTAL CARE IF:  You have pain, bleeding, or soreness in the gum, tooth, jaw, or mouth area.  A permanent tooth becomes loose or separated from the gum socket.  You experience a blow or injury to the mouth or jaw area. Document Released: 05/04/2011 Document Revised: 11/14/2011 Document Reviewed: 05/04/2011 [employer] Patient Information 2014 [employer].  

## 2013-12-12 NOTE — MAU Note (Signed)
Pt G4 P3 at 38.2wks with gum and tooth pain, unable to eat all day.

## 2013-12-12 NOTE — MAU Provider Note (Signed)
History     CSN: [id number]  Arrival date and time: 12/11/13 2352   First Provider Initiated Contact with Patient 12/12/13 0225      Chief Complaint  Patient presents with  . Dental Pain   HPI Ms. Tobias Alexandershley N Kovar is a 28 y.o. [phone] [id number] at 193w2d who presents to MAU today with complaint of dental pain. The patient states that she has had issues with dental pain x months. She states that she was seen in the ED and given morphine and PCN which helped. Pain has returned recently and is becoming worse. She states that she was told by OB to see a dentist, but the dentist will not do anything while she is pregnant. The patient states that pain is worse on the left lower jaw. She denies fever, abdominal pain, vaginal bleeding, LOF or contractions. She reports good fetal movement.   OB History   Grav Para Term Preterm Abortions TAB SAB Ect Mult Living   4 3 3  0 0 0 0 0 0 3      Past Medical History  Diagnosis Date  . Medical history non-contributory     Past Surgical History  Procedure Laterality Date  . Cesarean section      x 3    Family History  Problem Relation Age of Onset  . Hypertension Mother     History  Substance Use Topics  . Smoking status: Never Smoker   . Smokeless tobacco: Never Used  . Alcohol Use: No    Allergies: No Known Allergies  Prescriptions prior to admission  Medication Sig Dispense Refill  . Naproxen Sodium (ALEVE) 220 MG CAPS Take 2 capsules by mouth 2 (two) times daily as needed (pain).      . Prenatal Vit-Fe Fumarate-FA (PRENATAL COMPLETE) 14-0.4 MG TABS Take 1 tablet by mouth daily.  60 each  0    Review of Systems  Constitutional: Negative for fever.  Gastrointestinal: Negative for abdominal pain.  Genitourinary:       Neg - vaginal bleeding, discharge   Physical Exam   Blood pressure 120/78, pulse 84, temperature 98.1 F (36.7 C), temperature source Oral, resp. rate 18, height 5\' 2"  (1.575 m), weight 79.47 kg (175 lb 3.2 oz), last  menstrual period 03/19/2013.  Physical Exam  Constitutional: She is oriented to person, place, and time. She appears well-developed and well-nourished. No distress.  HENT:  Head: Normocephalic and atraumatic.  Mouth/Throat: Oropharynx is clear and moist and mucous membranes are normal. She does not have dentures. No oral lesions. Abnormal dentition. Dental caries present. No dental abscesses, uvula swelling or lacerations.  Patient has numerous broken teeth and significant dental carries. No evidence of abscess today.   Cardiovascular: Normal rate.   Respiratory: Effort normal.  Neurological: She is alert and oriented to person, place, and time.  Skin: Skin is warm and dry. No erythema.  Psychiatric: She has a normal mood and affect.   Results for orders placed during the hospital encounter of 12/11/13 (from the past 24 hour(s))  URINALYSIS, ROUTINE W REFLEX MICROSCOPIC     Status: Abnormal   Collection Time    12/12/13 12:30 AM      Result Value Ref Range   Color, Urine YELLOW  YELLOW   APPearance HAZY (*) CLEAR   Specific Gravity, Urine 1.025  1.005 - 1.030   pH 6.0  5.0 - 8.0   Glucose, UA NEGATIVE  NEGATIVE mg/dL   Hgb urine dipstick NEGATIVE  NEGATIVE  Bilirubin Urine NEGATIVE  NEGATIVE   Ketones, ur NEGATIVE  NEGATIVE mg/dL   Protein, ur NEGATIVE  NEGATIVE mg/dL   Urobilinogen, UA 1.0  0.0 - 1.0 mg/dL   Nitrite NEGATIVE  NEGATIVE   Leukocytes, UA TRACE (*) NEGATIVE  URINE MICROSCOPIC-ADD ON     Status: Abnormal   Collection Time    12/12/13 12:30 AM      Result Value Ref Range   Squamous Epithelial / LPF MANY (*) RARE   WBC, UA 3-6  <3 WBC/hpf   RBC / HPF 0-2  <3 RBC/hpf   Bacteria, UA FEW (*) RARE   Urine-Other MUCOUS PRESENT     Fetal Monitoring: Baseline: 130 bpm, moderate variability, + accelerations, no decelerations Contractions: none  MAU Course  Procedures None  MDM Discussed with Dr. Orlene Och at [facility]. PCN and percocet recommended. Dentist ASAP after  deliver. Return with fever 2 percocet and first dose of PCN given in MAU  Assessment and Plan  A: Dental pain  P: Discharge home Rx for Percocet and PCN VK given to patient Discussed warning signs for infection Patient advised to follow-up with Dr. Gaynell Face as scheduled Patient may return to MAU as needed or if her condition were to change or worsen  Freddi Starr, PA-C  12/12/2013, 2:25 AM

## 2013-12-13 NOTE — H&P (Signed)
NAMPaticia Stack:  Laatsch, Macil               ACCOUNT NO.:  [id number]  MEDICAL RECORD NO.:  [id number]  LOCATION:                                 FACILITY:  PHYSICIAN:  Kathreen CosierBernard A. Paiton Boultinghouse, M.D.DATE OF BIRTH:  [birth year]  DATE OF ADMISSION: DATE OF DISCHARGE:                             HISTORY & PHYSICAL   HISTORY OF PRESENT ILLNESS:  The patient is a 28 year old, gravida 4, [facility]0-0-3[facility]  She had 3 previous C-sections, and she is now at term.  This pregnancy complicated by a positive GBS, otherwise negative.  PAST MEDICAL HISTORY:  Negative.  PAST SURGICAL HISTORY:  C-section x3.  SOCIAL HISTORY:  Negative.  SYSTEM REVIEW:  Negative.  PHYSICAL EXAMINATION:  GENERAL:  Well-developed female, in no distress. HEENT:  Negative. LUNGS:  Clear to P and A. HEART:  Regular rhythm.  No murmurs, no gallops. BREASTS:  Negative. ABDOMEN:  Term. PELVIC:  Deferred. EXTREMITIES:  Negative.          ______________________________ Kathreen CosierBernard A. Banessa Mao, M.D.     BAM/MEDQ  D:  12/13/2013  T:  12/13/2013  Job:  [id number]

## 2013-12-16 ENCOUNTER — Encounter (HOSPITAL_COMMUNITY): Payer: Self-pay | Admitting: *Deleted

## 2013-12-17 ENCOUNTER — Encounter (HOSPITAL_COMMUNITY): Payer: Self-pay

## 2013-12-17 ENCOUNTER — Encounter (HOSPITAL_COMMUNITY): Payer: Self-pay | Admitting: Anesthesiology

## 2013-12-17 ENCOUNTER — Encounter (HOSPITAL_COMMUNITY)
Admission: RE | Admit: 2013-12-17 | Discharge: 2013-12-17 | Disposition: A | Discharge status: Home / Self Care | Payer: Medicaid Other | Source: Ambulatory Visit | Attending: Obstetrics | Admitting: Obstetrics

## 2013-12-17 HISTORY — DX: Gastro-esophageal reflux disease without esophagitis: K21.9

## 2013-12-17 HISTORY — DX: Anemia, unspecified: D64.9

## 2013-12-17 LAB — TYPE AND SCREEN
ABO/RH(D): A POS
Antibody Screen: NEGATIVE

## 2013-12-17 LAB — CBC
HCT: 34.5 % — ABNORMAL LOW (ref 36.0–46.0)
Hemoglobin: 10.9 g/dL — ABNORMAL LOW (ref 12.0–15.0)
MCH: 27 pg (ref 26.0–34.0)
MCHC: 31.6 g/dL (ref 30.0–36.0)
MCV: 85.6 fL (ref 78.0–100.0)
PLATELETS: 177 10*3/uL (ref 150–400)
RBC: 4.03 MIL/uL (ref 3.87–5.11)
RDW: 15.5 % (ref 11.5–15.5)
WBC: 10.6 10*3/uL — AB (ref 4.0–10.5)

## 2013-12-17 LAB — RPR

## 2013-12-17 LAB — RAPID HIV SCREEN (WH-MAU): Rapid HIV Screen: NONREACTIVE

## 2013-12-17 NOTE — Patient Instructions (Signed)
Your procedure is scheduled on: Wednesday, December 18, 2013  [facility] at: 7:00AM  Pick up the phone at the desk and dial [phone].  Call this number if you have problems the morning of surgery: [phone].  Remember: Do NOT eat food: AFTER MIDNIGHT TONIGHT Do NOT drink clear liquids after: AFTER MIDNIGHT TONIGHT Take these medicines the morning of surgery with a SIP OF WATER: NONE  Do NOT wear jewelry (body piercing), metal hair clips/bobby pins, make-up, or nail polish. Do NOT wear lotions, powders, or perfumes.  You may wear deoderant. Do NOT shave for 48 hours prior to surgery. Do NOT bring valuables to the hospital. Leave suitcase in car.  After surgery it may be brought to your room.  For patients admitted to the hospital, checkout time is 11:00 AM the day of discharge.

## 2013-12-17 NOTE — Patient Instructions (Signed)
Your procedure is scheduled on: Wednesday, December 18, 2013  [facility] at: 7:00 am  Pick up the phone at the desk and dial [phone].  Call this number if you have problems the morning of surgery: [phone].  Remember: Do NOT eat food: after midnight tonight Do NOT drink clear liquids after: after midnight tonight Take these medicines the morning of surgery with a SIP OF WATER: none  Do NOT wear jewelry (body piercing), metal hair clips/bobby pins, make-up, or nail polish. Do NOT wear lotions, powders, or perfumes.  You may wear deoderant. Do NOT shave for 48 hours prior to surgery. Do NOT bring valuables to the hospital.  Leave suitcase in car.  After surgery it may be brought to your room.  For patients admitted to the hospital, checkout time is 11:00 AM the day of discharge.

## 2013-12-17 NOTE — Anesthesia Preprocedure Evaluation (Addendum)
Anesthesia Evaluation  Patient identified by MRN, date of birth, ID band Patient awake    Reviewed: Allergy & Precautions, H&P , NPO status , Patient's Chart, lab work & pertinent test results  Airway Mallampati: III TM Distance: >3 FB Neck ROM: full  Mouth opening: Limited Mouth Opening Comment: Mouth opening limited by tooth pain Dental   Has been on percocet for tooth pain:   Pulmonary neg pulmonary ROS,  breath sounds clear to auscultation  Pulmonary exam normal       Cardiovascular negative cardio ROS  Rhythm:regular Rate:Normal     Neuro/Psych negative neurological ROS  negative psych ROS   GI/Hepatic Neg liver ROS, GERD-  ,  Endo/Other  Obesity  Renal/GU negative Renal ROS  negative genitourinary   Musculoskeletal negative musculoskeletal ROS (+)   Abdominal Normal abdominal exam  (+)   Peds  Hematology  (+) anemia ,   Anesthesia Other Findings   Reproductive/Obstetrics (+) Pregnancy (h/o c/s x3, for repeat) Previous C/Section                          Anesthesia Physical Anesthesia Plan  ASA: II  Anesthesia Plan: Spinal   Post-op Pain Management:    Induction:   Airway Management Planned: Natural Airway  Additional Equipment:   Intra-op Plan:   Post-operative Plan:   Informed Consent: I have reviewed the patients History and Physical, chart, labs and discussed the procedure including the risks, benefits and alternatives for the proposed anesthesia with the patient or authorized representative who has indicated his/her understanding and acceptance.     Plan Discussed with: Anesthesiologist, CRNA and Surgeon  Anesthesia Plan Comments:         Anesthesia Quick Evaluation

## 2013-12-18 ENCOUNTER — Encounter (HOSPITAL_COMMUNITY)
Admission: RE | Disposition: A | Discharge status: Home / Self Care | Payer: Self-pay | Source: Ambulatory Visit | Attending: Obstetrics

## 2013-12-18 ENCOUNTER — Inpatient Hospital Stay (HOSPITAL_COMMUNITY): Payer: Medicaid Other | Admitting: Anesthesiology

## 2013-12-18 ENCOUNTER — Encounter (HOSPITAL_COMMUNITY): Payer: Medicaid Other | Admitting: Anesthesiology

## 2013-12-18 ENCOUNTER — Inpatient Hospital Stay (HOSPITAL_COMMUNITY)
Admission: RE | Admit: 2013-12-18 | Discharge: 2013-12-21 | DRG: 766 | Disposition: A | Discharge status: Home / Self Care | Payer: Medicaid Other | Source: Ambulatory Visit | Attending: Obstetrics | Admitting: Obstetrics

## 2013-12-18 ENCOUNTER — Encounter (HOSPITAL_COMMUNITY): Payer: Self-pay | Admitting: Anesthesiology

## 2013-12-18 DIAGNOSIS — O34219 Maternal care for unspecified type scar from previous cesarean delivery: Principal | ICD-10-CM | POA: Diagnosis present

## 2013-12-18 DIAGNOSIS — O99892 Other specified diseases and conditions complicating childbirth: Secondary | ICD-10-CM | POA: Diagnosis present

## 2013-12-18 DIAGNOSIS — O9902 Anemia complicating childbirth: Secondary | ICD-10-CM | POA: Diagnosis present

## 2013-12-18 DIAGNOSIS — Z98891 History of uterine scar from previous surgery: Secondary | ICD-10-CM

## 2013-12-18 DIAGNOSIS — O9989 Other specified diseases and conditions complicating pregnancy, childbirth and the puerperium: Secondary | ICD-10-CM

## 2013-12-18 DIAGNOSIS — K089 Disorder of teeth and supporting structures, unspecified: Secondary | ICD-10-CM | POA: Diagnosis present

## 2013-12-18 DIAGNOSIS — D649 Anemia, unspecified: Secondary | ICD-10-CM | POA: Diagnosis present

## 2013-12-18 DIAGNOSIS — K219 Gastro-esophageal reflux disease without esophagitis: Secondary | ICD-10-CM | POA: Diagnosis present

## 2013-12-18 DIAGNOSIS — Z22330 Carrier of Group B streptococcus: Secondary | ICD-10-CM

## 2013-12-18 SURGERY — Surgical Case
Anesthesia: Spinal

## 2013-12-18 MED ORDER — METOCLOPRAMIDE HCL 5 MG/ML IJ SOLN
INTRAMUSCULAR | Status: DC | PRN
  Administered 2013-12-18 (×2): 5 mg via INTRAVENOUS

## 2013-12-18 MED ORDER — SIMETHICONE 80 MG PO CHEW: 80.0000 mg | CHEWABLE_TABLET | ORAL | Status: DC | PRN

## 2013-12-18 MED ORDER — ONDANSETRON HCL 4 MG/2ML IJ SOLN
INTRAMUSCULAR | Status: DC | PRN
  Administered 2013-12-18: 4 mg via INTRAVENOUS

## 2013-12-18 MED ORDER — MORPHINE SULFATE (PF) 0.5 MG/ML IJ SOLN
INTRAMUSCULAR | Status: DC | PRN
  Administered 2013-12-18: .1 mg via INTRATHECAL

## 2013-12-18 MED ORDER — CEFAZOLIN SODIUM-DEXTROSE 2-3 GM-% IV SOLR
INTRAVENOUS | Status: AC
  Filled 2013-12-18: qty 50

## 2013-12-18 MED ORDER — HYDROMORPHONE 0.3 MG/ML IV SOLN
INTRAVENOUS | Status: DC
  Administered 2013-12-18: 0.79 mg via INTRAVENOUS
  Administered 2013-12-18: 14:00:00 via INTRAVENOUS
  Administered 2013-12-18: 1.19 mg via INTRAVENOUS
  Administered 2013-12-19: 1.39 mg via INTRAVENOUS
  Administered 2013-12-19: 0.799 mg via INTRAVENOUS
  Administered 2013-12-19: 1.13 mg via INTRAVENOUS
  Filled 2013-12-18: qty 25

## 2013-12-18 MED ORDER — MEPERIDINE HCL 25 MG/ML IJ SOLN
INTRAMUSCULAR | Status: AC
  Filled 2013-12-18: qty 1

## 2013-12-18 MED ORDER — ONDANSETRON HCL 4 MG/2ML IJ SOLN: 4.0000 mg | INTRAMUSCULAR | Status: DC | PRN

## 2013-12-18 MED ORDER — OXYCODONE-ACETAMINOPHEN 5-325 MG PO TABS: 1.0000 | ORAL_TABLET | Freq: Four times a day (QID) | ORAL | Status: DC | PRN

## 2013-12-18 MED ORDER — SIMETHICONE 80 MG PO CHEW
80.0000 mg | CHEWABLE_TABLET | ORAL | Status: DC
  Administered 2013-12-19 – 2013-12-20 (×2): 80 mg via ORAL
  Filled 2013-12-18 (×3): qty 1

## 2013-12-18 MED ORDER — METOCLOPRAMIDE HCL 5 MG/ML IJ SOLN: 10.0000 mg | Freq: Three times a day (TID) | INTRAMUSCULAR | Status: DC | PRN

## 2013-12-18 MED ORDER — KETOROLAC TROMETHAMINE 30 MG/ML IJ SOLN: 30.0000 mg | Freq: Four times a day (QID) | INTRAMUSCULAR | Status: AC | PRN

## 2013-12-18 MED ORDER — MEPERIDINE HCL 25 MG/ML IJ SOLN
INTRAMUSCULAR | Status: DC | PRN
  Administered 2013-12-18: 25 mg via INTRAVENOUS

## 2013-12-18 MED ORDER — DIPHENHYDRAMINE HCL 12.5 MG/5ML PO ELIX
12.5000 mg | ORAL_SOLUTION | Freq: Four times a day (QID) | ORAL | Status: DC | PRN
  Filled 2013-12-18: qty 5

## 2013-12-18 MED ORDER — SODIUM CHLORIDE 0.9 % IJ SOLN: 3.0000 mL | INTRAMUSCULAR | Status: DC | PRN

## 2013-12-18 MED ORDER — ONDANSETRON HCL 4 MG/2ML IJ SOLN: 4.0000 mg | Freq: Four times a day (QID) | INTRAMUSCULAR | Status: DC | PRN

## 2013-12-18 MED ORDER — OXYCODONE-ACETAMINOPHEN 5-325 MG PO TABS
1.0000 | ORAL_TABLET | ORAL | Status: DC | PRN
  Administered 2013-12-19 – 2013-12-21 (×9): 2 via ORAL
  Filled 2013-12-18 (×9): qty 2

## 2013-12-18 MED ORDER — PHENYLEPHRINE 8 MG IN D5W 100 ML (0.08MG/ML) PREMIX OPTIME
INJECTION | INTRAVENOUS | Status: DC | PRN
  Administered 2013-12-18: 60 ug/min via INTRAVENOUS

## 2013-12-18 MED ORDER — PHENYLEPHRINE 40 MCG/ML (10ML) SYRINGE FOR IV PUSH (FOR BLOOD PRESSURE SUPPORT)
PREFILLED_SYRINGE | INTRAVENOUS | Status: AC
  Filled 2013-12-18: qty 5

## 2013-12-18 MED ORDER — MENTHOL 3 MG MT LOZG
1.0000 | LOZENGE | OROMUCOSAL | Status: DC | PRN
  Administered 2013-12-19: 3 mg via ORAL
  Filled 2013-12-18: qty 9

## 2013-12-18 MED ORDER — DIBUCAINE 1 % RE OINT: 1.0000 "application " | TOPICAL_OINTMENT | RECTAL | Status: DC | PRN

## 2013-12-18 MED ORDER — MORPHINE SULFATE 0.5 MG/ML IJ SOLN
INTRAMUSCULAR | Status: AC
  Filled 2013-12-18: qty 10

## 2013-12-18 MED ORDER — PRENATAL MULTIVITAMIN CH
1.0000 | ORAL_TABLET | Freq: Every day | ORAL | Status: DC
  Administered 2013-12-19 – 2013-12-20 (×2): 1 via ORAL
  Filled 2013-12-18 (×2): qty 1

## 2013-12-18 MED ORDER — PHENYLEPHRINE HCL 10 MG/ML IJ SOLN
INTRAMUSCULAR | Status: DC | PRN
  Administered 2013-12-18: 80 ug via INTRAVENOUS

## 2013-12-18 MED ORDER — KETOROLAC TROMETHAMINE 30 MG/ML IJ SOLN
INTRAMUSCULAR | Status: AC
  Administered 2013-12-18: 30 mg via INTRAMUSCULAR
  Filled 2013-12-18: qty 1

## 2013-12-18 MED ORDER — NALOXONE HCL 0.4 MG/ML IJ SOLN: 0.4000 mg | INTRAMUSCULAR | Status: DC | PRN

## 2013-12-18 MED ORDER — BUPIVACAINE IN DEXTROSE 0.75-8.25 % IT SOLN
INTRATHECAL | Status: DC | PRN
  Administered 2013-12-18: 1.3 mL via INTRATHECAL

## 2013-12-18 MED ORDER — DIPHENHYDRAMINE HCL 50 MG/ML IJ SOLN: 12.5000 mg | INTRAMUSCULAR | Status: DC | PRN

## 2013-12-18 MED ORDER — OXYTOCIN 10 UNIT/ML IJ SOLN
INTRAMUSCULAR | Status: AC
  Filled 2013-12-18: qty 4

## 2013-12-18 MED ORDER — PHENYLEPHRINE 8 MG IN D5W 100 ML (0.08MG/ML) PREMIX OPTIME
INJECTION | INTRAVENOUS | Status: AC
  Filled 2013-12-18: qty 100

## 2013-12-18 MED ORDER — SODIUM CHLORIDE 0.9 % IJ SOLN: 9.0000 mL | INTRAMUSCULAR | Status: DC | PRN

## 2013-12-18 MED ORDER — ONDANSETRON HCL 4 MG/2ML IJ SOLN: 4.0000 mg | Freq: Three times a day (TID) | INTRAMUSCULAR | Status: DC | PRN

## 2013-12-18 MED ORDER — METOCLOPRAMIDE HCL 5 MG/ML IJ SOLN
INTRAMUSCULAR | Status: AC
  Filled 2013-12-18: qty 2

## 2013-12-18 MED ORDER — FENTANYL CITRATE 0.05 MG/ML IJ SOLN
25.0000 ug | INTRAMUSCULAR | Status: DC | PRN
  Administered 2013-12-18: 50 ug via INTRAVENOUS

## 2013-12-18 MED ORDER — SCOPOLAMINE 1 MG/3DAYS TD PT72
1.0000 | MEDICATED_PATCH | Freq: Once | TRANSDERMAL | Status: DC
  Administered 2013-12-18: 1.5 mg via TRANSDERMAL

## 2013-12-18 MED ORDER — DIPHENHYDRAMINE HCL 25 MG PO CAPS
25.0000 mg | ORAL_CAPSULE | ORAL | Status: DC | PRN
  Administered 2013-12-19 (×2): 25 mg via ORAL
  Filled 2013-12-18 (×2): qty 1

## 2013-12-18 MED ORDER — CEFAZOLIN SODIUM-DEXTROSE 2-3 GM-% IV SOLR
2.0000 g | Freq: Once | INTRAVENOUS | Status: AC
  Administered 2013-12-18: 2 g via INTRAVENOUS

## 2013-12-18 MED ORDER — IBUPROFEN 600 MG PO TABS: 600.0000 mg | ORAL_TABLET | Freq: Four times a day (QID) | ORAL | Status: DC

## 2013-12-18 MED ORDER — NALOXONE HCL 1 MG/ML IJ SOLN
1.0000 ug/kg/h | INTRAVENOUS | Status: DC | PRN
  Filled 2013-12-18: qty 2

## 2013-12-18 MED ORDER — WITCH HAZEL-GLYCERIN EX PADS: 1.0000 "application " | MEDICATED_PAD | CUTANEOUS | Status: DC | PRN

## 2013-12-18 MED ORDER — IBUPROFEN 600 MG PO TABS
600.0000 mg | ORAL_TABLET | Freq: Four times a day (QID) | ORAL | Status: DC
  Administered 2013-12-18 – 2013-12-21 (×11): 600 mg via ORAL
  Filled 2013-12-18 (×11): qty 1

## 2013-12-18 MED ORDER — LACTATED RINGERS IV SOLN
INTRAVENOUS | Status: DC
  Administered 2013-12-18 – 2013-12-19 (×2): via INTRAVENOUS

## 2013-12-18 MED ORDER — TETANUS-DIPHTH-ACELL PERTUSSIS 5-2.5-18.5 LF-MCG/0.5 IM SUSP: 0.5000 mL | Freq: Once | INTRAMUSCULAR | Status: DC

## 2013-12-18 MED ORDER — DIPHENHYDRAMINE HCL 50 MG/ML IJ SOLN: 25.0000 mg | INTRAMUSCULAR | Status: DC | PRN

## 2013-12-18 MED ORDER — DIPHENHYDRAMINE HCL 25 MG PO CAPS: 25.0000 mg | ORAL_CAPSULE | Freq: Four times a day (QID) | ORAL | Status: DC | PRN

## 2013-12-18 MED ORDER — MEPERIDINE HCL 25 MG/ML IJ SOLN: 6.2500 mg | INTRAMUSCULAR | Status: DC | PRN

## 2013-12-18 MED ORDER — FENTANYL CITRATE 0.05 MG/ML IJ SOLN
INTRAMUSCULAR | Status: DC | PRN
  Administered 2013-12-18: 85 ug via INTRAVENOUS
  Administered 2013-12-18: 15 ug via INTRATHECAL

## 2013-12-18 MED ORDER — NALBUPHINE HCL 10 MG/ML IJ SOLN: 5.0000 mg | INTRAMUSCULAR | Status: DC | PRN

## 2013-12-18 MED ORDER — SENNOSIDES-DOCUSATE SODIUM 8.6-50 MG PO TABS
2.0000 | ORAL_TABLET | ORAL | Status: DC
  Administered 2013-12-19 – 2013-12-20 (×3): 2 via ORAL
  Filled 2013-12-18 (×3): qty 2

## 2013-12-18 MED ORDER — FENTANYL CITRATE 0.05 MG/ML IJ SOLN
INTRAMUSCULAR | Status: AC
  Filled 2013-12-18: qty 2

## 2013-12-18 MED ORDER — ZOLPIDEM TARTRATE 5 MG PO TABS: 5.0000 mg | ORAL_TABLET | Freq: Every evening | ORAL | Status: DC | PRN

## 2013-12-18 MED ORDER — SIMETHICONE 80 MG PO CHEW
80.0000 mg | CHEWABLE_TABLET | Freq: Three times a day (TID) | ORAL | Status: DC
  Administered 2013-12-18 – 2013-12-20 (×5): 80 mg via ORAL
  Filled 2013-12-18 (×6): qty 1

## 2013-12-18 MED ORDER — DIPHENHYDRAMINE HCL 50 MG/ML IJ SOLN: 12.5000 mg | Freq: Four times a day (QID) | INTRAMUSCULAR | Status: DC | PRN

## 2013-12-18 MED ORDER — OXYTOCIN 10 UNIT/ML IJ SOLN
40.0000 [IU] | INTRAVENOUS | Status: DC | PRN
  Administered 2013-12-18: 40 [IU] via INTRAVENOUS

## 2013-12-18 MED ORDER — LANOLIN HYDROUS EX OINT
1.0000 | TOPICAL_OINTMENT | CUTANEOUS | Status: DC | PRN
Start: 2013-12-18 — End: 2013-12-21

## 2013-12-18 MED ORDER — ONDANSETRON HCL 4 MG/2ML IJ SOLN
INTRAMUSCULAR | Status: AC
  Filled 2013-12-18: qty 2

## 2013-12-18 MED ORDER — LACTATED RINGERS IV SOLN
INTRAVENOUS | Status: DC
  Administered 2013-12-18 (×4): via INTRAVENOUS

## 2013-12-18 MED ORDER — SCOPOLAMINE 1 MG/3DAYS TD PT72
MEDICATED_PATCH | TRANSDERMAL | Status: AC
  Administered 2013-12-18: 1.5 mg via TRANSDERMAL
  Filled 2013-12-18: qty 1

## 2013-12-18 MED ORDER — 0.9 % SODIUM CHLORIDE (POUR BTL) OPTIME
TOPICAL | Status: DC | PRN
  Administered 2013-12-18: 1000 mL

## 2013-12-18 MED ORDER — ONDANSETRON HCL 4 MG PO TABS: 4.0000 mg | ORAL_TABLET | ORAL | Status: DC | PRN

## 2013-12-18 MED ORDER — KETOROLAC TROMETHAMINE 30 MG/ML IJ SOLN
30.0000 mg | Freq: Four times a day (QID) | INTRAMUSCULAR | Status: AC | PRN
  Administered 2013-12-18: 30 mg via INTRAMUSCULAR

## 2013-12-18 MED ORDER — OXYTOCIN 40 UNITS IN LACTATED RINGERS INFUSION - SIMPLE MED: 62.5000 mL/h | INTRAVENOUS | Status: AC

## 2013-12-18 SURGICAL SUPPLY — 36 items
CLAMP CORD UMBIL (MISCELLANEOUS) IMPLANT
CLOTH BEACON ORANGE TIMEOUT ST (SAFETY) ×3 IMPLANT
DERMABOND ADHESIVE PROPEN (GAUZE/BANDAGES/DRESSINGS) ×2
DERMABOND ADVANCED (GAUZE/BANDAGES/DRESSINGS) ×2
DERMABOND ADVANCED .7 DNX12 (GAUZE/BANDAGES/DRESSINGS) ×1 IMPLANT
DERMABOND ADVANCED .7 DNX6 (GAUZE/BANDAGES/DRESSINGS) ×1 IMPLANT
DRAPE LG THREE QUARTER DISP (DRAPES) IMPLANT
DRSG OPSITE POSTOP 4X10 (GAUZE/BANDAGES/DRESSINGS) ×3 IMPLANT
DURAPREP 26ML APPLICATOR (WOUND CARE) ×3 IMPLANT
ELECT REM PT RETURN 9FT ADLT (ELECTROSURGICAL) ×3
ELECTRODE REM PT RTRN 9FT ADLT (ELECTROSURGICAL) ×1 IMPLANT
EXTRACTOR VACUUM M CUP 4 TUBE (SUCTIONS) IMPLANT
EXTRACTOR VACUUM M CUP 4' TUBE (SUCTIONS)
GLOVE BIO SURGEON STRL SZ8.5 (GLOVE) ×3 IMPLANT
GOWN STRL REUS W/TWL 2XL LVL3 (GOWN DISPOSABLE) ×3 IMPLANT
GOWN STRL REUS W/TWL LRG LVL3 (GOWN DISPOSABLE) ×3 IMPLANT
KIT ABG SYR 3ML LUER SLIP (SYRINGE) IMPLANT
NEEDLE HYPO 25X5/8 SAFETYGLIDE (NEEDLE) ×3 IMPLANT
NS IRRIG 1000ML POUR BTL (IV SOLUTION) ×3 IMPLANT
PACK C SECTION WH (CUSTOM PROCEDURE TRAY) ×3 IMPLANT
PAD OB MATERNITY 4.3X12.25 (PERSONAL CARE ITEMS) ×3 IMPLANT
SPONGE GAUZE 4X4 12PLY STER LF (GAUZE/BANDAGES/DRESSINGS) ×3 IMPLANT
SUT CHROMIC 0 CT 802H (SUTURE) ×3 IMPLANT
SUT CHROMIC 1 CTX 36 (SUTURE) ×6 IMPLANT
SUT CHROMIC 2 0 SH (SUTURE) ×3 IMPLANT
SUT GUT PLAIN 0 CT-3 TAN 27 (SUTURE) IMPLANT
SUT MON AB 4-0 PS1 27 (SUTURE) ×3 IMPLANT
SUT VIC AB 0 CT1 18XCR BRD8 (SUTURE) IMPLANT
SUT VIC AB 0 CT1 8-18 (SUTURE)
SUT VIC AB 0 CTX 36 (SUTURE) ×4
SUT VIC AB 0 CTX36XBRD ANBCTRL (SUTURE) ×2 IMPLANT
TAPE CLOTH SURG 4X10 WHT LF (GAUZE/BANDAGES/DRESSINGS) ×3 IMPLANT
TOWEL OR 17X24 6PK STRL BLUE (TOWEL DISPOSABLE) ×3 IMPLANT
TRAY FOLEY CATH 14FR (SET/KITS/TRAYS/PACK) ×3 IMPLANT
WATER STERILE IRR 1000ML POUR (IV SOLUTION) ×3 IMPLANT
YANKAUER SUCT BULB TIP NO VENT (SUCTIONS) ×3 IMPLANT

## 2013-12-18 NOTE — H&P (Signed)
  There has been no change in the history and physical since surgeon of dictation

## 2013-12-18 NOTE — Anesthesia Postprocedure Evaluation (Signed)
Anesthesia Post Note  Patient: Mia Wilkinson  Procedure(s) Performed: Procedure(s) (LRB): Repeat CESAREAN SECTION (N/A)  Anesthesia type: Spinal  Patient location: Mother/Baby  Post pain: Pain level controlled  Post assessment: Post-op Vital signs reviewed  Last Vitals:  Filed Vitals:   12/18/13 1548  BP: 95/59  Pulse: 85  Temp: 37.2 C  Resp: 18    Post vital signs: Reviewed  Level of consciousness: awake  Complications: No apparent anesthesia complications

## 2013-12-18 NOTE — Anesthesia Postprocedure Evaluation (Signed)
  Anesthesia Post-op Note  Anesthesia Post Note  Patient: Mia Wilkinson  Procedure(s) Performed: Procedure(s) (LRB): Repeat CESAREAN SECTION (N/A)  Anesthesia type: Spinal  Patient location: PACU  Post pain: Pain level controlled  Post assessment: Post-op Vital signs reviewed  Post vital signs: Reviewed  Level of consciousness: awake  Complications: No apparent anesthesia complications

## 2013-12-18 NOTE — Anesthesia Procedure Notes (Signed)
Spinal  Patient location during procedure: OR Start time: 12/18/2013 8:25 AM Staffing Performed by: anesthesiologist  Preanesthetic Checklist Completed: patient identified, site marked, surgical consent, pre-op evaluation, timeout performed, IV checked, risks and benefits discussed and monitors and equipment checked Spinal Block Patient position: sitting Prep: site prepped and draped and DuraPrep Patient monitoring: heart rate, cardiac monitor, continuous pulse ox and blood pressure Approach: midline Location: L3-4 Injection technique: single-shot Needle Needle type: Pencan  Needle gauge: 24 G Needle length: 9 cm Assessment Sensory level: T4 Additional Notes Clear free flow CSF on first attempt.  No paresthesia.  Patient tolerated procedure well with no apparent complications.  Jasmine DecemberA. Cassidy, MD

## 2013-12-18 NOTE — Op Note (Signed)
Preop diagnosis previous cesarean section at term Surgeon Dr. Francoise CeoBernard Marshall First assistant Dr. Coral Ceoharles Harper Anesthesia spinal Procedure patient placed on the operating table in the supine position after the spinal administered abdomen prepped and draped bladder emptied with a Foley catheter a transverse suprapubic incision made through the old scar carried down to the rectus fascia fascia cleaned and incised the length of the incision recti muscles retracted laterally peritoneum incised longitudinally transverse incision made on the visceroperitoneum above the bladder bladder mobilized inferiorly transverse low uterine incision made the patient was delivered of a female Apgar 9 and 9 from the OP position the placenta was anterior fundal removed manually and sent to labor and delivery uterine cavity clean with dry laps uterine incision closed in 2 layers with continuous #1 chromic hemostasis satisfactory lap and sponge counts correct abdomen closed in layers peritoneum continuous with 2-0 chromic fascia continuous with of 0 Dexon and the skin shows a subcuticular stitch of 4-0 Monocryl

## 2013-12-18 NOTE — Transfer of Care (Signed)
Immediate Anesthesia Transfer of Care Note  Patient: Mia Wilkinson  Procedure(s) Performed: Procedure(s): Repeat CESAREAN SECTION (N/A)  Patient Location: PACU  Anesthesia Type:Spinal  Level of Consciousness: awake, alert  and oriented  Airway & Oxygen Therapy: Patient Spontanous Breathing  Post-op Assessment: Report given to PACU RN and Post -op Vital signs reviewed and stable  Post vital signs: Reviewed and stable  Complications: No apparent anesthesia complications

## 2013-12-19 ENCOUNTER — Encounter (HOSPITAL_COMMUNITY): Payer: Self-pay | Admitting: Obstetrics

## 2013-12-19 LAB — CBC
HCT: 29.8 % — ABNORMAL LOW (ref 36.0–46.0)
Hemoglobin: 9.2 g/dL — ABNORMAL LOW (ref 12.0–15.0)
MCH: 26.3 pg (ref 26.0–34.0)
MCHC: 30.9 g/dL (ref 30.0–36.0)
MCV: 85.1 fL (ref 78.0–100.0)
PLATELETS: 158 10*3/uL (ref 150–400)
RBC: 3.5 MIL/uL — ABNORMAL LOW (ref 3.87–5.11)
RDW: 15.7 % — ABNORMAL HIGH (ref 11.5–15.5)
WBC: 11.5 10*3/uL — AB (ref 4.0–10.5)

## 2013-12-19 NOTE — Progress Notes (Signed)
UR completed 

## 2013-12-19 NOTE — Plan of Care (Signed)
Problem: Discharge Progression Outcomes Goal: Activity appropriate for discharge plan Outcome: Adequate for Discharge Patient often asks for help with self and baby cares.  Patient encourages to increase activity to level appropriate for discharge.

## 2013-12-19 NOTE — Progress Notes (Signed)
Patient ID: Mia Wilkinson, female   DOB: 20-Oct-1985, 28 y.o.   MRN: [id number] Postpartum day one Vital signs normal Fundus firm Dressing dry Legs negative Doing well

## 2013-12-19 NOTE — Progress Notes (Addendum)
Patient told this RN to "leave percocets, I will take them when I want".  When told this is against our policy, patient argued that previous RN let her keep her percocet in the room.  This RN again stated the policy and stated to patient that the percocet would be brought to the room when patient was going to take them, witnessed by this RN as per the policy.  Patient called for percocets 1 hour later and this RN witnessed patient taking percocet.  Patient also called the nurse's station and asked for support bands to be given by RN to FOB.  RN stated, along with NS at desk, that this is not our policy, the bands are given out by security.  It was explained that the FOB could walk to the security desk anytime to get the band.  Again, the patient argued that previously the RN did this "and couldn't understand why we would not do this".  Again, both the RN and NS explained FOB could easily get bands from security.  When this RN  pressed on patient's belly, patient hit at and yelled at RN.  RN stopped the assessment and stepped away from patient until patient stated it was OK to proceed.  Assessment was WNL and patient able to tolerate pressure on abdomen.  Note:  When patient was asked which nurse left percocet in the room at bedside and gave support bands to FOB, the patient stated "I am not going to say because I don't want to get her in trouble".   House Coverage notified of interactions.

## 2013-12-19 NOTE — Progress Notes (Signed)
Patient continues to call out for help with personal and baby cares often, asking nurse for help taking shower, etc..

## 2013-12-20 MED ORDER — BISACODYL 10 MG RE SUPP
10.0000 mg | Freq: Once | RECTAL | Status: AC
  Administered 2013-12-20: 10 mg via RECTAL
  Filled 2013-12-20: qty 1

## 2013-12-20 NOTE — Progress Notes (Signed)
Patient ID: Mia Wilkinson, female   DOB: August 24, 1986, 28 y.o.   MRN: [id number] Postop day 2 Vital signs normal Incision clean and dry Lochia moderate legs negative doing well

## 2013-12-21 NOTE — Discharge Summary (Signed)
Obstetric Discharge Summary Reason for Admission: cesarean section Prenatal Procedures: none Intrapartum Procedures: cesarean: low cervical, transverse Postpartum Procedures: none Complications-Operative and Postpartum: none Hemoglobin  Date Value Ref Range Status  12/19/2013 9.2* 12.0 - 15.0 g/dL Final     HCT  Date Value Ref Range Status  12/19/2013 29.8* 36.0 - 46.0 % Final    Physical Exam:  General: alert Lochia: appropriate Uterine Fundus: firm Incision: healing well DVT Evaluation: No evidence of DVT seen on physical exam.  Discharge Diagnoses: Term Pregnancy-delivered  Discharge Information: Date: 12/21/2013 Activity: pelvic rest Diet: routine Medications: Percocet Condition: stable Instructions: refer to practice specific booklet Discharge to: home Follow-up Information   Follow up with Kathreen CosierMARSHALL,BERNARD A, MD.   Specialty:  Obstetrics and Gynecology   Contact information:   [address] [place] [address] [phone]       Newborn Data: Live born female  Birth Weight: 5 lb 15.8 oz (2715 g) APGAR: 9, 9  Home with mother.  Kathreen CosierBernard A Marshall 12/21/2013, 7:35 AM

## 2013-12-21 NOTE — Discharge Instructions (Signed)
Discharge instructions ° °· You can wash your hair °· Shower °· Eat what you want °· Drink what you want °· See me in 6 weeks °· Your ankles are going to swell more in the next 2 weeks than when pregnant °· No sex for 6 weeks ° ° °Leeza Heiner A Caysen Whang, MD 12/21/2013 ° ° °

## 2014-07-07 ENCOUNTER — Encounter (HOSPITAL_COMMUNITY): Payer: Self-pay | Admitting: Obstetrics

## 2014-07-07 LAB — PROCEDURE REPORT - SCANNED: Pap: NEGATIVE

## 2014-09-21 IMAGING — US US OB COMP +14 WK
1 series · 12 of 28 positions shown · non-contrast
Comparison: none

[Series 1: us ob detail +14 wk · 90 acquisitions, 12 frames shown]
[im 4/90]
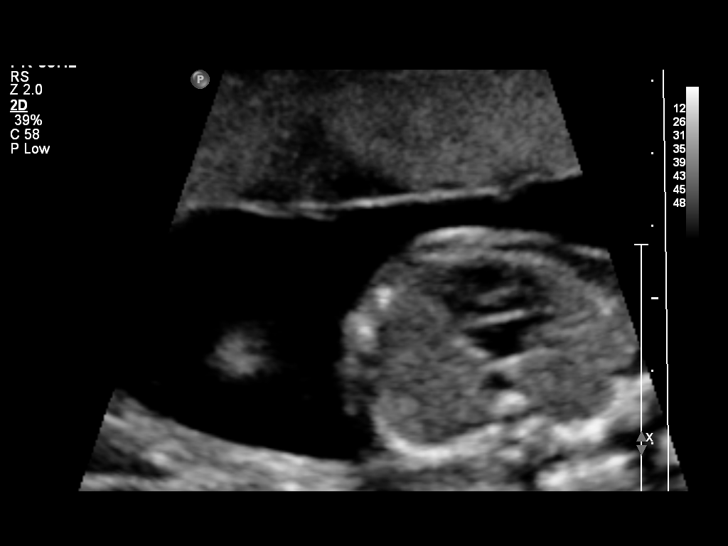
[im 10/90]
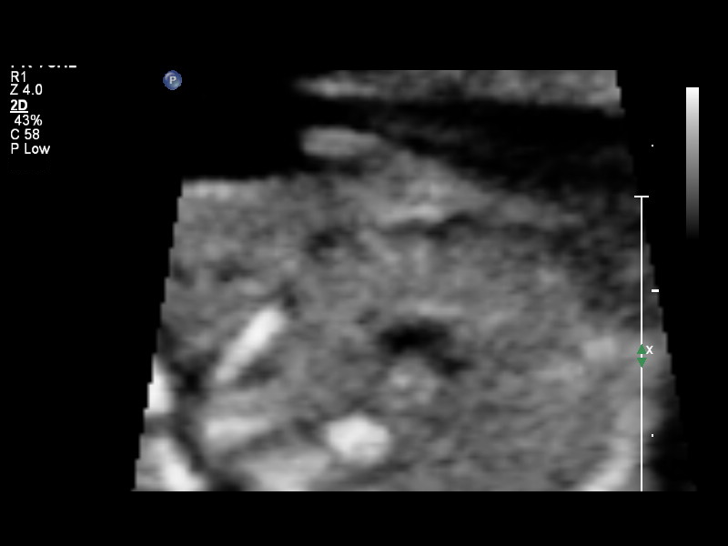
[im 17/90]
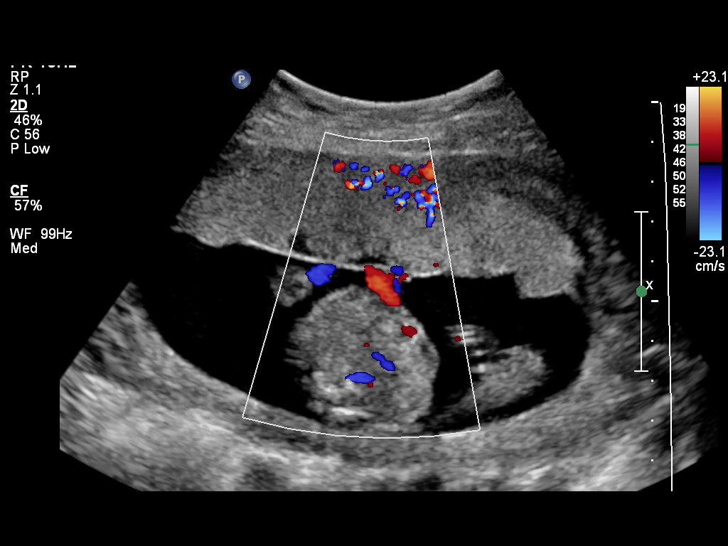
[im 27/90]
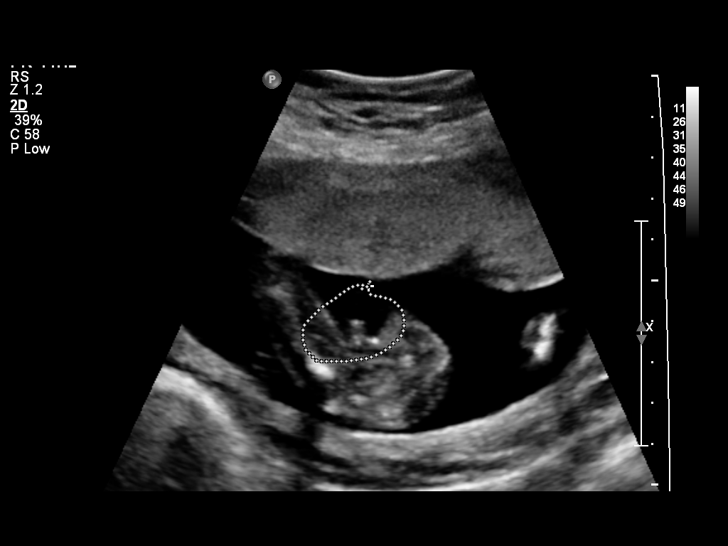
[im 33/90]
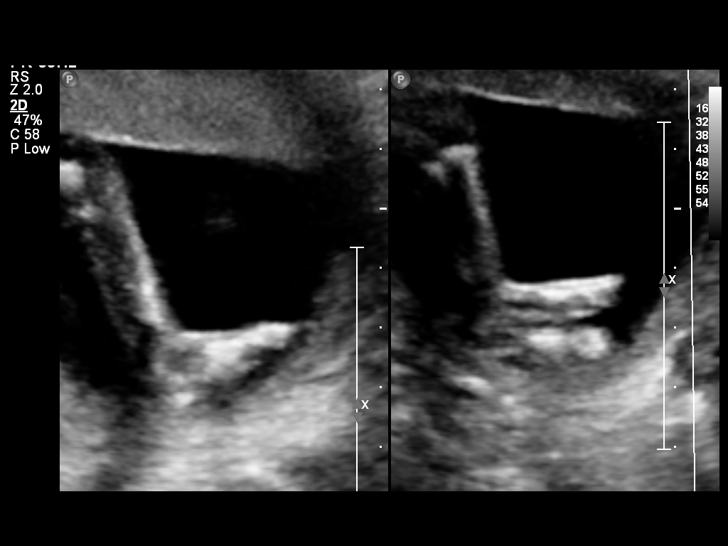
[im 40/90]
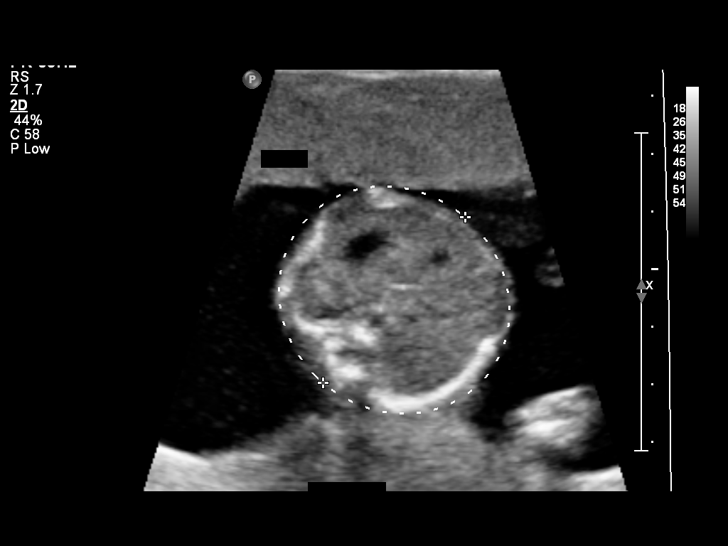
[im 50/90]
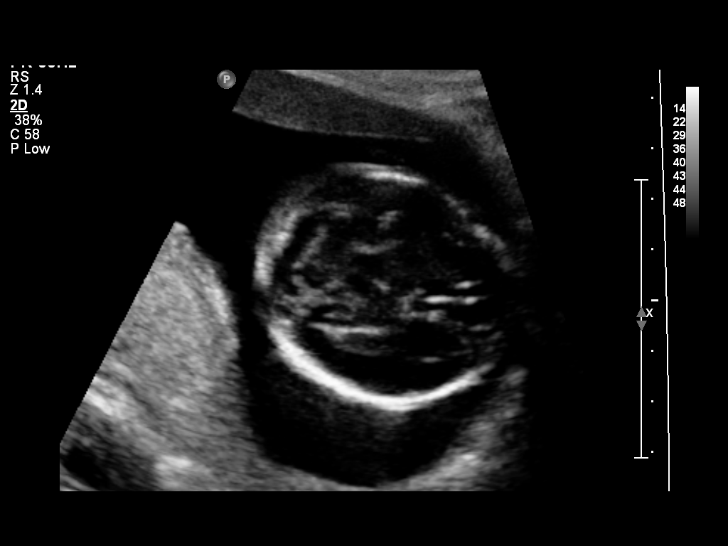
[im 57/90]
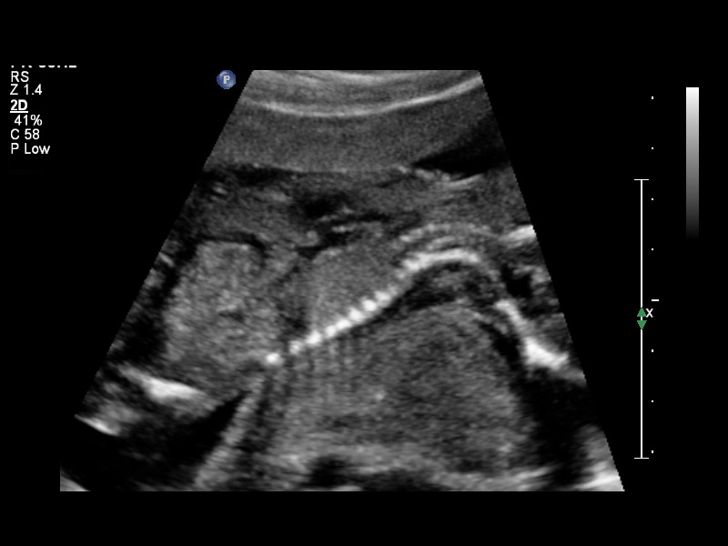
[im 63/90]
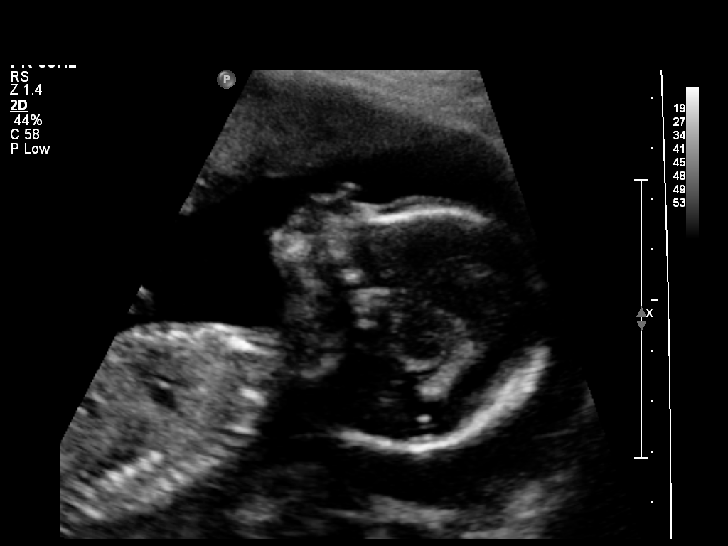
[im 73/90]
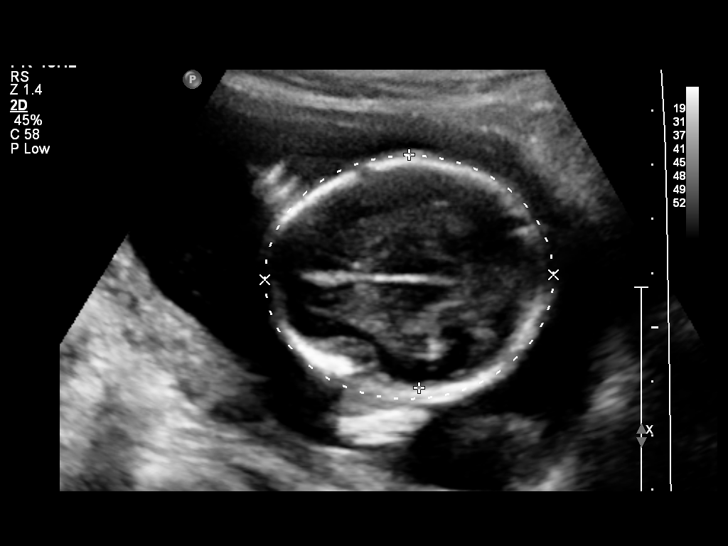
[im 80/90]
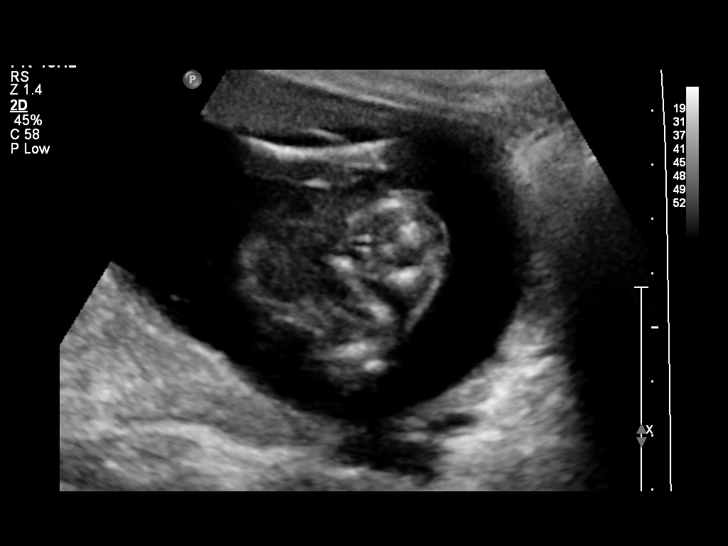
[im 86/90]
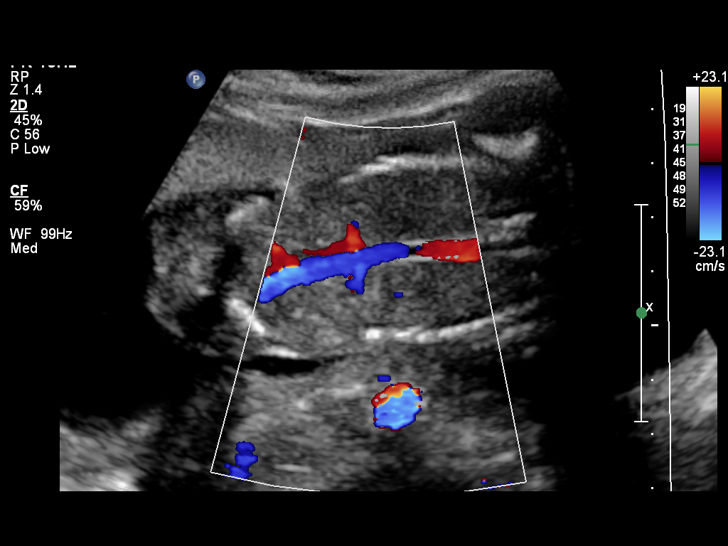

[12 of 28 positions shown; findings below may reference images not displayed]

OBSTETRICS REPORT
                      (Signed Final 07/30/2013 [DATE])

Service(s) Provided

 US OB COMP + 14 WK                                    76805.1
Indications

 Basic anatomic survey
 Previous cesarean section x 3
Fetal Evaluation

 Num Of Fetuses:    1
 Fetal Heart Rate:  150                          bpm
 Cardiac Activity:  Observed
 Presentation:      Variable
 Placenta:          Anterior, above cervical os
 P. Cord            Visualized, central
 Insertion:

 Amniotic Fluid
 AFI FV:      Subjectively within normal limits
                                             Larg Pckt:    4.15  cm
Biometry

 BPD:     42.5  mm     G. Age:  18w 6d                CI:        74.16   70 - 86
                                                      FL/HC:      17.8   16.1 -

 HC:     156.7  mm     G. Age:  18w 5d       23  %    HC/AC:      1.25   1.09 -

 AC:       125  mm     G. Age:  18w 1d       20  %    FL/BPD:
 FL:      27.9  mm     G. Age:  18w 4d       27  %    FL/AC:      22.3   20 - 24
 HUM:     27.5  mm     G. Age:  18w 6d       44  %
 CER:     18.2  mm     G. Age:  18w 1d       23  %
 NFT:     2.82  mm

 Est. FW:     236  gm      0 lb 8 oz     32  %
Gestational Age

 LMP:           19w 0d        Date:  03/19/13                 EDD:   12/24/13
 U/S Today:     18w 4d                                        EDD:   12/27/13
 Best:          19w 0d     Det. By:  LMP  (03/19/13)          EDD:   12/24/13
Anatomy
 Cranium:          Appears normal         Aortic Arch:      Appears normal
 Fetal Cavum:      Appears normal         Ductal Arch:      Appears normal
 Ventricles:       Appears normal         Diaphragm:        Appears normal
 Choroid Plexus:   Appears normal         Stomach:          Appears normal
 Cerebellum:       Appears normal         Abdomen:          Appears normal
 Posterior Fossa:  Appears normal         Abdominal Wall:   Appears nml (cord
                                                            insert, abd wall)
 Nuchal Fold:      Appears normal         Cord Vessels:     Appears normal (3
                                                            vessel cord)
 Face:             Appears normal         Kidneys:          Appear normal
                   (orbits and profile)
 Lips:             Appears normal         Bladder:          Appears normal
 Heart:            Appears normal         Spine:            Limited but no
                   (4CH, axis, and                          intracranial signs of
                   situs)                                   NTD
 RVOT:             Appears normal         Lower             Appears normal
                                          Extremities:
 LVOT:             Appears normal         Upper             Appears normal
                                          Extremities:

 Other:  Fetus appears to be a male. Heels and 5th digit visualized. Nasal
         bone visualized.
Targeted Anatomy

 Fetal Central Nervous System
 Lat. Ventricles:  7.2                    Cisterna Magna:
Cervix Uterus Adnexa

 Cervical Length:    3.91     cm

 Cervix:       Normal appearance by transabdominal scan.
 Uterus:       No abnormality visualized.
 Cul De Sac:   No free fluid seen.

 Left Ovary:    Within normal limits.
 Right Ovary:   Within normal limits.
 Adnexa:     No abnormality visualized.
Impression

 IUP at 19+0 weeks
 Normal detailed fetal anatomy
 Markers of aneuploidy: none
 Normal amniotic fluid volume
 Measurements consistent with LMP dating
Recommendations

 Follow-up as clinically indicated

 questions or concerns.

## 2014-10-14 ENCOUNTER — Encounter (HOSPITAL_COMMUNITY): Payer: Self-pay | Admitting: Emergency Medicine

## 2014-10-14 ENCOUNTER — Emergency Department (HOSPITAL_COMMUNITY)
Admission: EM | Admit: 2014-10-14 | Discharge: 2014-10-14 | Disposition: A | Discharge status: Home / Self Care | Payer: Medicaid Other | Attending: Emergency Medicine | Admitting: Emergency Medicine

## 2014-10-14 DIAGNOSIS — Z3A27 27 weeks gestation of pregnancy: Secondary | ICD-10-CM | POA: Insufficient documentation

## 2014-10-14 DIAGNOSIS — Z862 Personal history of diseases of the blood and blood-forming organs and certain disorders involving the immune mechanism: Secondary | ICD-10-CM | POA: Insufficient documentation

## 2014-10-14 DIAGNOSIS — R42 Dizziness and giddiness: Secondary | ICD-10-CM | POA: Insufficient documentation

## 2014-10-14 DIAGNOSIS — Z8719 Personal history of other diseases of the digestive system: Secondary | ICD-10-CM | POA: Diagnosis not present

## 2014-10-14 DIAGNOSIS — O9989 Other specified diseases and conditions complicating pregnancy, childbirth and the puerperium: Secondary | ICD-10-CM | POA: Insufficient documentation

## 2014-10-14 DIAGNOSIS — O99512 Diseases of the respiratory system complicating pregnancy, second trimester: Secondary | ICD-10-CM | POA: Diagnosis present

## 2014-10-14 DIAGNOSIS — J020 Streptococcal pharyngitis: Secondary | ICD-10-CM | POA: Insufficient documentation

## 2014-10-14 LAB — CBC
HCT: 34.6 % — ABNORMAL LOW (ref 36.0–46.0)
HEMOGLOBIN: 10.8 g/dL — AB (ref 12.0–15.0)
MCH: 25.9 pg — AB (ref 26.0–34.0)
MCHC: 31.2 g/dL (ref 30.0–36.0)
MCV: 83 fL (ref 78.0–100.0)
Platelets: 180 10*3/uL (ref 150–400)
RBC: 4.17 MIL/uL (ref 3.87–5.11)
RDW: 14.7 % (ref 11.5–15.5)
WBC: 13.6 10*3/uL — AB (ref 4.0–10.5)

## 2014-10-14 LAB — BASIC METABOLIC PANEL
Anion gap: 9 (ref 5–15)
BUN: 5 mg/dL — AB (ref 6–23)
CO2: 23 mmol/L (ref 19–32)
Calcium: 8.6 mg/dL (ref 8.4–10.5)
Chloride: 102 mmol/L (ref 96–112)
Creatinine, Ser: 0.54 mg/dL (ref 0.50–1.10)
GFR calc Af Amer: >90 mL/min (ref >90)
GFR calc non Af Amer: >90 mL/min (ref >90)
GLUCOSE: 115 mg/dL — AB (ref 70–99)
POTASSIUM: 3 mmol/L — AB (ref 3.5–5.1)
Sodium: 134 mmol/L — ABNORMAL LOW (ref 135–145)

## 2014-10-14 LAB — RAPID STREP SCREEN (MED CTR MEBANE ONLY): STREPTOCOCCUS, GROUP A SCREEN (DIRECT): POSITIVE — AB

## 2014-10-14 MED ORDER — SODIUM CHLORIDE 0.9 % IV BOLUS (SEPSIS)
1000.0000 mL | Freq: Once | INTRAVENOUS | Status: AC
  Administered 2014-10-14: 1000 mL via INTRAVENOUS

## 2014-10-14 MED ORDER — PENICILLIN G BENZATHINE 1200000 UNIT/2ML IM SUSP
1.2000 10*6.[IU] | Freq: Once | INTRAMUSCULAR | Status: AC
  Administered 2014-10-14: 1.2 10*6.[IU] via INTRAMUSCULAR
  Filled 2014-10-14: qty 2

## 2014-10-14 MED ORDER — ACETAMINOPHEN 325 MG PO TABS
650.0000 mg | ORAL_TABLET | Freq: Once | ORAL | Status: AC
  Administered 2014-10-14: 650 mg via ORAL
  Filled 2014-10-14: qty 2

## 2014-10-14 NOTE — ED Provider Notes (Signed)
CSN: [id number]     Arrival date & time 10/14/14  1546 History   First MD Initiated Contact with Patient 10/14/14 1919     Chief Complaint  Patient presents with  . [redacted] Weeks Pregnant   . Chills  . Sore Throat   Patient is a 29 y.o. female presenting with pharyngitis. The history is provided by the patient.  Sore Throat This is a new problem. The current episode started 12 to 24 hours ago. The problem occurs constantly. The problem has not changed since onset.Pertinent negatives include no abdominal pain and no shortness of breath.    Past Medical History  Diagnosis Date  . Medical history non-contributory   . GERD (gastroesophageal reflux disease)     with pregnancy  . Anemia    Past Surgical History  Procedure Laterality Date  . Cesarean section      x 3  . Cesarean section N/A 12/18/2013    Procedure: Repeat CESAREAN SECTION;  Surgeon: Kathreen CosierBernard A Marshall, MD;  Location: [facility];  Service: Obstetrics;  Laterality: N/A;   Family History  Problem Relation Age of Onset  . Hypertension Mother    History  Substance Use Topics  . Smoking status: Never Smoker   . Smokeless tobacco: Never Used  . Alcohol Use: No   OB History    Gravida Para Term Preterm AB TAB SAB Ectopic Multiple Living   4 4 4  0 0 0 0 0 0 4     Review of Systems  Constitutional: Negative for fever.  HENT: Positive for sore throat.   Respiratory: Negative for shortness of breath.   Gastrointestinal: Negative for vomiting, abdominal pain and diarrhea.  Genitourinary: Negative for dysuria, vaginal bleeding, vaginal discharge and vaginal pain.  Skin: Negative for rash.  Neurological: Positive for light-headedness.      Allergies  Review of patient's allergies indicates no known allergies.  Home Medications   Prior to Admission medications   Not on File   BP 108/64 mmHg  Pulse 118  Temp(Src) 99.1 F (37.3 C) (Oral)  Resp 18  SpO2 97% Physical Exam  Constitutional: She appears well-developed and  well-nourished. No distress.  HENT:  Head: Normocephalic and atraumatic.  Right Ear: External ear normal.  Left Ear: External ear normal.  Mouth/Throat: Posterior oropharyngeal erythema present. No oropharyngeal exudate.  Eyes: Conjunctivae are normal. Right eye exhibits no discharge. Left eye exhibits no discharge. No scleral icterus.  Neck: Normal range of motion. Neck supple. No tracheal deviation present.  Cardiovascular: Normal rate, regular rhythm and intact distal pulses.   Pulmonary/Chest: Effort normal and breath sounds normal. No stridor. No respiratory distress. She has no wheezes. She has no rales.  Abdominal: Soft. Bowel sounds are normal. She exhibits no distension. There is no tenderness. There is no rebound and no guarding.  Gravid abdomen , no ttp  Musculoskeletal: She exhibits no edema or tenderness.  Neurological: She is alert. She has normal strength. No cranial nerve deficit (no facial droop, extraocular movements intact, no slurred speech) or sensory deficit. She exhibits normal muscle tone. She displays no seizure activity. Coordination normal.  Skin: Skin is warm and dry. No rash noted.  Psychiatric: She has a normal mood and affect.  Nursing note and vitals reviewed.   ED Course  Procedures (including critical care time) Labs Review Labs Reviewed  RAPID STREP SCREEN - Abnormal; Notable for the following:    Streptococcus, Group A Screen (Direct) POSITIVE (*)    All other components  within normal limits  BASIC METABOLIC PANEL - Abnormal; Notable for the following:    Sodium 134 (*)    Potassium 3.0 (*)    Glucose, Bld 115 (*)    BUN 5 (*)    All other components within normal limits  CBC - Abnormal; Notable for the following:    WBC 13.6 (*)    Hemoglobin 10.8 (*)    HCT 34.6 (*)    MCH 25.9 (*)    All other components within normal limits    Medications  sodium chloride 0.9 % bolus 1,000 mL (1,000 mLs Intravenous New Bag/Given 10/14/14 2053)   penicillin g benzathine (BICILLIN LA) 1200000 UNIT/2ML injection 1.2 Million Units (1.2 Million Units Intramuscular Given 10/14/14 2007)  acetaminophen (TYLENOL) tablet 650 mg (650 mg Oral Given 10/14/14 2007)     MDM   Final diagnoses:  Strep pharyngitis   Pt is tolerating PO.  No sign of abscess.  Tylenol for pain.  Given penicillin IM in the ED.   Linwood Dibbles, MD 10/14/14 2110

## 2014-10-14 NOTE — ED Notes (Addendum)
Pt denies dizziness or vaginal bleeding. Pt says she has not been drinking fluids today. Pt in NAD.

## 2014-10-14 NOTE — ED Notes (Signed)
This RN is unsure how well throat was swabbed.  Pt seemed very irritated and would not follow directions.  Pt pulled away after one side was swabbed.

## 2014-10-14 NOTE — ED Notes (Signed)
Pt reports chills and not feeling well at home with sore throat. Productive cough. Pt [redacted] weeks pregnant and says "I ache all over". Pt HR in triage is 128. Denies SOB or Chest Pain. Denies Urinary symptoms.

## 2014-10-14 NOTE — Discharge Instructions (Signed)

## 2014-10-14 NOTE — ED Notes (Signed)
Pt family told another nurse pt felt like she was going to pass out. Pt ambulated without difficulty to vital sign area. Vital signs stable. Pt informed nurse will get pt to a room as quickly as she can. Pt ambulated back to seat without difficulty.

## 2014-10-16 LAB — OB RESULTS CONSOLE RPR: RPR: NONREACTIVE

## 2014-10-16 LAB — OB RESULTS CONSOLE GC/CHLAMYDIA
Chlamydia: NEGATIVE
Gonorrhea: NEGATIVE

## 2015-01-10 NOTE — H&P (Signed)
NAMPaticia Stack:  Ziegler, Mabeline               ACCOUNT NO.:  [id number]  MEDICAL RECORD NO.:  [id number]  LOCATION:  PERIO                         FACILITY:  [facility]  PHYSICIAN:  Kathreen CosierBernard A. Rodolfo Notaro, M.D.DATE OF BIRTH:  04-02-1986  DATE OF ADMISSION:  01/13/2015 DATE OF DISCHARGE:                             HISTORY & PHYSICAL   HISTORY OF PRESENT ILLNESS:  The patient is a 29 year old, gravida 5, para 4-0-0-4 who has had 4 previous C-sections and is in for repeat. Her EDC Jan 17, 2015.  Her antenatal course was uneventful except for a positive group B streptococcus.  PAST SURGICAL HISTORY:  Negative except for her C-sections.  PAST MEDICAL HISTORY:  Negative.  SOCIAL HISTORY:  Negative.  SYSTEM REVIEW:  Denies headache, nausea, vomiting, or abdominal pain.  PHYSICAL EXAMINATION:  GENERAL:  Well-developed female in no distress. HEENT:  Negative. LUNGS:  Clear to P and A. HEART:  Regular rhythm.  No murmurs, no gallops. BREASTS:  No masses. ABDOMEN:  Term. PELVIC:  Deferred. EXTREMITIES:  Negative.          ______________________________ Kathreen CosierBernard A. Elizjah Noblet, M.D.     BAM/MEDQ  D:  01/09/2015  T:  01/09/2015  Job:  [id number]

## 2015-01-12 ENCOUNTER — Encounter (HOSPITAL_COMMUNITY)
Admission: RE | Admit: 2015-01-12 | Discharge: 2015-01-12 | Disposition: A | Discharge status: Home / Self Care | Payer: Medicaid Other | Source: Ambulatory Visit | Attending: Obstetrics | Admitting: Obstetrics

## 2015-01-12 ENCOUNTER — Encounter (HOSPITAL_COMMUNITY): Payer: Self-pay

## 2015-01-12 LAB — CBC
HCT: 28.2 % — ABNORMAL LOW (ref 36.0–46.0)
Hemoglobin: 8.7 g/dL — ABNORMAL LOW (ref 12.0–15.0)
MCH: 23.6 pg — AB (ref 26.0–34.0)
MCHC: 30.9 g/dL (ref 30.0–36.0)
MCV: 76.6 fL — AB (ref 78.0–100.0)
Platelets: 177 10*3/uL (ref 150–400)
RBC: 3.68 MIL/uL — ABNORMAL LOW (ref 3.87–5.11)
RDW: 17.8 % — ABNORMAL HIGH (ref 11.5–15.5)
WBC: 9.3 10*3/uL (ref 4.0–10.5)

## 2015-01-12 LAB — OB RESULTS CONSOLE HIV ANTIBODY (ROUTINE TESTING): HIV: NONREACTIVE

## 2015-01-12 LAB — TYPE AND SCREEN
ABO/RH(D): A POS
Antibody Screen: NEGATIVE

## 2015-01-12 NOTE — Anesthesia Preprocedure Evaluation (Addendum)
Anesthesia Evaluation  Patient identified by MRN, date of birth, ID band Patient awake    Reviewed: Allergy & Precautions, NPO status , Patient's Chart, lab work & pertinent test results  History of Anesthesia Complications Negative for: history of anesthetic complications  Airway Mallampati: III  TM Distance: >3 FB Neck ROM: Full    Dental no notable dental hx. (+) Dental Advisory Given   Pulmonary neg pulmonary ROS,  breath sounds clear to auscultation  Pulmonary exam normal       Cardiovascular negative cardio ROS Normal cardiovascular examRhythm:Regular Rate:Normal     Neuro/Psych negative neurological ROS  negative psych ROS   GI/Hepatic Neg liver ROS, GERD-  Controlled and Medicated,  Endo/Other  obesity  Renal/GU negative Renal ROS  negative genitourinary   Musculoskeletal negative musculoskeletal ROS (+)   Abdominal   Peds negative pediatric ROS (+)  Hematology  (+) anemia ,   Anesthesia Other Findings   Reproductive/Obstetrics (+) Pregnancy (hx of 4 prior C/S)                            Anesthesia Physical Anesthesia Plan  ASA: II  Anesthesia Plan: Combined Spinal and Epidural   Post-op Pain Management:    Induction:   Airway Management Planned:   Additional Equipment:   Intra-op Plan:   Post-operative Plan:   Informed Consent: I have reviewed the patients History and Physical, chart, labs and discussed the procedure including the risks, benefits and alternatives for the proposed anesthesia with the patient or authorized representative who has indicated his/her understanding and acceptance.   Dental advisory given  Plan Discussed with:   Anesthesia Plan Comments:         Anesthesia Quick Evaluation

## 2015-01-12 NOTE — Patient Instructions (Addendum)
Your procedure is scheduled on:  Jan 13, 2015 [facility] at: 0600 am  Pick up the phone at the desk and dial [phone].  Call this number if you have problems the morning of surgery: [phone].  Remember: Do NOT eat food: after midnight tonight  Do NOT drink clear liquids after: after midnight tonight  Take these medicines the morning of surgery with a SIP OF WATER: none   Do NOT wear jewelry (body piercing), metal hair clips/bobby pins, or nail polish. Do NOT wear lotions, powders, or perfumes.  You may wear deoderant. Do NOT shave for 48 hours prior to surgery. Do NOT bring valuables to the hospital. Leave suitcase in car.  After surgery it may be brought to your room.  For patients admitted to the hospital, checkout time is 11:00 AM the day of discharge.

## 2015-01-13 ENCOUNTER — Inpatient Hospital Stay (HOSPITAL_COMMUNITY)
Admission: RE | Admit: 2015-01-13 | Discharge: 2015-01-16 | DRG: 766 | Disposition: A | Discharge status: Home / Self Care | Payer: Medicaid Other | Source: Ambulatory Visit | Attending: Obstetrics | Admitting: Obstetrics

## 2015-01-13 ENCOUNTER — Inpatient Hospital Stay (HOSPITAL_COMMUNITY): Payer: Medicaid Other | Admitting: Anesthesiology

## 2015-01-13 ENCOUNTER — Encounter (HOSPITAL_COMMUNITY): Payer: Self-pay | Admitting: *Deleted

## 2015-01-13 ENCOUNTER — Encounter (HOSPITAL_COMMUNITY)
Admission: RE | Disposition: A | Discharge status: Home / Self Care | Payer: Self-pay | Source: Ambulatory Visit | Attending: Obstetrics

## 2015-01-13 DIAGNOSIS — Z98891 History of uterine scar from previous surgery: Secondary | ICD-10-CM

## 2015-01-13 DIAGNOSIS — O3421 Maternal care for scar from previous cesarean delivery: Principal | ICD-10-CM | POA: Diagnosis present

## 2015-01-13 DIAGNOSIS — N858 Other specified noninflammatory disorders of uterus: Secondary | ICD-10-CM | POA: Diagnosis present

## 2015-01-13 DIAGNOSIS — Z3A39 39 weeks gestation of pregnancy: Secondary | ICD-10-CM | POA: Diagnosis present

## 2015-01-13 DIAGNOSIS — O99824 Streptococcus B carrier state complicating childbirth: Secondary | ICD-10-CM | POA: Diagnosis present

## 2015-01-13 DIAGNOSIS — O34219 Maternal care for unspecified type scar from previous cesarean delivery: Secondary | ICD-10-CM

## 2015-01-13 LAB — RPR: RPR: NONREACTIVE

## 2015-01-13 LAB — HIV ANTIBODY (ROUTINE TESTING W REFLEX): HIV Screen 4th Generation wRfx: NONREACTIVE

## 2015-01-13 SURGERY — Surgical Case
Anesthesia: Spinal | Site: Abdomen

## 2015-01-13 MED ORDER — LACTATED RINGERS IV SOLN
INTRAVENOUS | Status: DC
  Administered 2015-01-13 (×3): via INTRAVENOUS

## 2015-01-13 MED ORDER — ONDANSETRON HCL 4 MG/2ML IJ SOLN: 4.0000 mg | Freq: Once | INTRAMUSCULAR | Status: DC | PRN

## 2015-01-13 MED ORDER — MORPHINE SULFATE (PF) 0.5 MG/ML IJ SOLN
INTRAMUSCULAR | Status: DC | PRN
  Administered 2015-01-13: .2 mg via EPIDURAL

## 2015-01-13 MED ORDER — FENTANYL CITRATE (PF) 100 MCG/2ML IJ SOLN
INTRAMUSCULAR | Status: DC | PRN
  Administered 2015-01-13: 10 ug via INTRATHECAL

## 2015-01-13 MED ORDER — SIMETHICONE 80 MG PO CHEW
80.0000 mg | CHEWABLE_TABLET | Freq: Three times a day (TID) | ORAL | Status: DC
  Administered 2015-01-13 – 2015-01-15 (×5): 80 mg via ORAL
  Filled 2015-01-13 (×7): qty 1

## 2015-01-13 MED ORDER — DIPHENHYDRAMINE HCL 50 MG/ML IJ SOLN: 12.5000 mg | INTRAMUSCULAR | Status: DC | PRN

## 2015-01-13 MED ORDER — ZOLPIDEM TARTRATE 5 MG PO TABS: 5.0000 mg | ORAL_TABLET | Freq: Every evening | ORAL | Status: DC | PRN

## 2015-01-13 MED ORDER — PHENYLEPHRINE 8 MG IN D5W 100 ML (0.08MG/ML) PREMIX OPTIME
INJECTION | INTRAVENOUS | Status: AC
  Filled 2015-01-13: qty 100

## 2015-01-13 MED ORDER — OXYTOCIN 10 UNIT/ML IJ SOLN
INTRAMUSCULAR | Status: AC
  Filled 2015-01-13: qty 4

## 2015-01-13 MED ORDER — KETOROLAC TROMETHAMINE 30 MG/ML IJ SOLN
30.0000 mg | Freq: Four times a day (QID) | INTRAMUSCULAR | Status: AC | PRN
  Administered 2015-01-13: 30 mg via INTRAMUSCULAR

## 2015-01-13 MED ORDER — SIMETHICONE 80 MG PO CHEW
80.0000 mg | CHEWABLE_TABLET | ORAL | Status: DC
  Administered 2015-01-14 (×2): 80 mg via ORAL
  Filled 2015-01-13 (×2): qty 1

## 2015-01-13 MED ORDER — PHENYLEPHRINE 8 MG IN D5W 100 ML (0.08MG/ML) PREMIX OPTIME
INJECTION | INTRAVENOUS | Status: DC | PRN
  Administered 2015-01-13: 60 ug/min via INTRAVENOUS

## 2015-01-13 MED ORDER — KETOROLAC TROMETHAMINE 30 MG/ML IJ SOLN
INTRAMUSCULAR | Status: AC
  Filled 2015-01-13: qty 1

## 2015-01-13 MED ORDER — DIPHENHYDRAMINE HCL 25 MG PO CAPS: 25.0000 mg | ORAL_CAPSULE | Freq: Four times a day (QID) | ORAL | Status: DC | PRN

## 2015-01-13 MED ORDER — ONDANSETRON HCL 4 MG/2ML IJ SOLN
INTRAMUSCULAR | Status: AC
  Filled 2015-01-13: qty 2

## 2015-01-13 MED ORDER — WITCH HAZEL-GLYCERIN EX PADS: 1.0000 "application " | MEDICATED_PAD | CUTANEOUS | Status: DC | PRN

## 2015-01-13 MED ORDER — DEXAMETHASONE SODIUM PHOSPHATE 10 MG/ML IJ SOLN
INTRAMUSCULAR | Status: DC | PRN
  Administered 2015-01-13: 5 mg via INTRAVENOUS

## 2015-01-13 MED ORDER — IBUPROFEN 600 MG PO TABS
600.0000 mg | ORAL_TABLET | Freq: Four times a day (QID) | ORAL | Status: DC
  Administered 2015-01-13 – 2015-01-16 (×12): 600 mg via ORAL
  Filled 2015-01-13 (×12): qty 1

## 2015-01-13 MED ORDER — TETANUS-DIPHTH-ACELL PERTUSSIS 5-2.5-18.5 LF-MCG/0.5 IM SUSP: 0.5000 mL | Freq: Once | INTRAMUSCULAR | Status: DC

## 2015-01-13 MED ORDER — LACTATED RINGERS IV SOLN
INTRAVENOUS | Status: DC | PRN
  Administered 2015-01-13: 08:00:00 via INTRAVENOUS

## 2015-01-13 MED ORDER — NALBUPHINE HCL 10 MG/ML IJ SOLN: 5.0000 mg | INTRAMUSCULAR | Status: DC | PRN

## 2015-01-13 MED ORDER — MORPHINE SULFATE 0.5 MG/ML IJ SOLN
INTRAMUSCULAR | Status: AC
  Filled 2015-01-13: qty 10

## 2015-01-13 MED ORDER — NALBUPHINE HCL 10 MG/ML IJ SOLN: 5.0000 mg | Freq: Once | INTRAMUSCULAR | Status: AC | PRN

## 2015-01-13 MED ORDER — FENTANYL CITRATE (PF) 100 MCG/2ML IJ SOLN
INTRAMUSCULAR | Status: AC
  Filled 2015-01-13: qty 2

## 2015-01-13 MED ORDER — ONDANSETRON HCL 4 MG/2ML IJ SOLN: 4.0000 mg | Freq: Three times a day (TID) | INTRAMUSCULAR | Status: DC | PRN

## 2015-01-13 MED ORDER — NALBUPHINE HCL 10 MG/ML IJ SOLN
5.0000 mg | INTRAMUSCULAR | Status: DC | PRN
  Administered 2015-01-13: 5 mg via SUBCUTANEOUS

## 2015-01-13 MED ORDER — PRENATAL MULTIVITAMIN CH
1.0000 | ORAL_TABLET | Freq: Every day | ORAL | Status: DC
  Administered 2015-01-14 – 2015-01-15 (×2): 1 via ORAL
  Filled 2015-01-13 (×2): qty 1

## 2015-01-13 MED ORDER — NALOXONE HCL 0.4 MG/ML IJ SOLN: 0.4000 mg | INTRAMUSCULAR | Status: DC | PRN

## 2015-01-13 MED ORDER — DIPHENHYDRAMINE HCL 50 MG/ML IJ SOLN
INTRAMUSCULAR | Status: DC | PRN
  Administered 2015-01-13: 25 mg via INTRAVENOUS

## 2015-01-13 MED ORDER — SENNOSIDES-DOCUSATE SODIUM 8.6-50 MG PO TABS
2.0000 | ORAL_TABLET | ORAL | Status: DC
  Administered 2015-01-14 (×2): 2 via ORAL
  Filled 2015-01-13 (×2): qty 2

## 2015-01-13 MED ORDER — BUPIVACAINE IN DEXTROSE 0.75-8.25 % IT SOLN
INTRATHECAL | Status: DC | PRN
  Administered 2015-01-13: 1.6 mL via INTRATHECAL

## 2015-01-13 MED ORDER — SCOPOLAMINE 1 MG/3DAYS TD PT72
MEDICATED_PATCH | TRANSDERMAL | Status: AC
  Filled 2015-01-13: qty 1

## 2015-01-13 MED ORDER — ONDANSETRON HCL 4 MG/2ML IJ SOLN
INTRAMUSCULAR | Status: DC | PRN
  Administered 2015-01-13: 4 mg via INTRAVENOUS

## 2015-01-13 MED ORDER — KETOROLAC TROMETHAMINE 30 MG/ML IJ SOLN: 30.0000 mg | Freq: Four times a day (QID) | INTRAMUSCULAR | Status: AC | PRN

## 2015-01-13 MED ORDER — MEPERIDINE HCL 25 MG/ML IJ SOLN
INTRAMUSCULAR | Status: AC
  Filled 2015-01-13: qty 1

## 2015-01-13 MED ORDER — SCOPOLAMINE 1 MG/3DAYS TD PT72
1.0000 | MEDICATED_PATCH | Freq: Once | TRANSDERMAL | Status: DC
  Administered 2015-01-13: 1.5 mg via TRANSDERMAL

## 2015-01-13 MED ORDER — MEPERIDINE HCL 25 MG/ML IJ SOLN
INTRAMUSCULAR | Status: DC | PRN
  Administered 2015-01-13 (×2): 12.5 mg via INTRAVENOUS

## 2015-01-13 MED ORDER — MENTHOL 3 MG MT LOZG: 1.0000 | LOZENGE | OROMUCOSAL | Status: DC | PRN

## 2015-01-13 MED ORDER — BUPIVACAINE IN DEXTROSE 0.75-8.25 % IT SOLN
INTRATHECAL | Status: AC
  Filled 2015-01-13: qty 2

## 2015-01-13 MED ORDER — SCOPOLAMINE 1 MG/3DAYS TD PT72: 1.0000 | MEDICATED_PATCH | Freq: Once | TRANSDERMAL | Status: DC

## 2015-01-13 MED ORDER — CEFAZOLIN SODIUM-DEXTROSE 2-3 GM-% IV SOLR: 2.0000 g | Freq: Once | INTRAVENOUS | Status: DC

## 2015-01-13 MED ORDER — SODIUM CHLORIDE 0.9 % IJ SOLN: 3.0000 mL | INTRAMUSCULAR | Status: DC | PRN

## 2015-01-13 MED ORDER — ACETAMINOPHEN 325 MG PO TABS
650.0000 mg | ORAL_TABLET | ORAL | Status: DC | PRN
  Administered 2015-01-13 – 2015-01-15 (×2): 650 mg via ORAL
  Filled 2015-01-13 (×2): qty 2

## 2015-01-13 MED ORDER — CEFAZOLIN SODIUM-DEXTROSE 2-3 GM-% IV SOLR
INTRAVENOUS | Status: AC
  Administered 2015-01-13: 2 g via INTRAVENOUS
  Filled 2015-01-13: qty 50

## 2015-01-13 MED ORDER — LACTATED RINGERS IV SOLN
40.0000 [IU] | INTRAVENOUS | Status: DC | PRN
  Administered 2015-01-13: 40 [IU] via INTRAVENOUS

## 2015-01-13 MED ORDER — NALBUPHINE HCL 10 MG/ML IJ SOLN
INTRAMUSCULAR | Status: AC
  Filled 2015-01-13: qty 1

## 2015-01-13 MED ORDER — FENTANYL CITRATE (PF) 100 MCG/2ML IJ SOLN
25.0000 ug | INTRAMUSCULAR | Status: DC | PRN
Start: 2015-01-13 — End: 2015-01-13
  Administered 2015-01-13 (×2): 50 ug via INTRAVENOUS

## 2015-01-13 MED ORDER — NALOXONE HCL 1 MG/ML IJ SOLN
1.0000 ug/kg/h | INTRAVENOUS | Status: DC | PRN
  Filled 2015-01-13: qty 2

## 2015-01-13 MED ORDER — OXYCODONE-ACETAMINOPHEN 5-325 MG PO TABS
1.0000 | ORAL_TABLET | ORAL | Status: DC | PRN
  Administered 2015-01-13 – 2015-01-15 (×6): 1 via ORAL
  Filled 2015-01-13 (×6): qty 1

## 2015-01-13 MED ORDER — MEPERIDINE HCL 25 MG/ML IJ SOLN: 6.2500 mg | INTRAMUSCULAR | Status: DC | PRN

## 2015-01-13 MED ORDER — OXYTOCIN 40 UNITS IN LACTATED RINGERS INFUSION - SIMPLE MED: 62.5000 mL/h | INTRAVENOUS | Status: AC

## 2015-01-13 MED ORDER — LACTATED RINGERS IV SOLN: INTRAVENOUS | Status: DC

## 2015-01-13 MED ORDER — DIBUCAINE 1 % RE OINT: 1.0000 "application " | TOPICAL_OINTMENT | RECTAL | Status: DC | PRN

## 2015-01-13 MED ORDER — LANOLIN HYDROUS EX OINT: 1.0000 "application " | TOPICAL_OINTMENT | CUTANEOUS | Status: DC | PRN

## 2015-01-13 MED ORDER — SIMETHICONE 80 MG PO CHEW: 80.0000 mg | CHEWABLE_TABLET | ORAL | Status: DC | PRN

## 2015-01-13 MED ORDER — DIPHENHYDRAMINE HCL 25 MG PO CAPS
25.0000 mg | ORAL_CAPSULE | ORAL | Status: DC | PRN
  Filled 2015-01-13: qty 1

## 2015-01-13 MED ORDER — OXYCODONE-ACETAMINOPHEN 5-325 MG PO TABS
2.0000 | ORAL_TABLET | ORAL | Status: DC | PRN
  Administered 2015-01-14 – 2015-01-16 (×4): 2 via ORAL
  Filled 2015-01-13 (×4): qty 2

## 2015-01-13 SURGICAL SUPPLY — 32 items
CLAMP CORD UMBIL (MISCELLANEOUS) ×3 IMPLANT
CLOTH BEACON ORANGE TIMEOUT ST (SAFETY) ×3 IMPLANT
DRAPE SHEET LG 3/4 BI-LAMINATE (DRAPES) ×3 IMPLANT
DRSG OPSITE POSTOP 4X10 (GAUZE/BANDAGES/DRESSINGS) ×3 IMPLANT
DURAPREP 26ML APPLICATOR (WOUND CARE) ×3 IMPLANT
ELECT REM PT RETURN 9FT ADLT (ELECTROSURGICAL) ×3
ELECTRODE REM PT RTRN 9FT ADLT (ELECTROSURGICAL) ×1 IMPLANT
EXTRACTOR VACUUM M CUP 4 TUBE (SUCTIONS) IMPLANT
EXTRACTOR VACUUM M CUP 4' TUBE (SUCTIONS)
GLOVE BIO SURGEON STRL SZ8.5 (GLOVE) ×3 IMPLANT
GOWN STRL REUS W/TWL 2XL LVL3 (GOWN DISPOSABLE) ×3 IMPLANT
GOWN STRL REUS W/TWL LRG LVL3 (GOWN DISPOSABLE) ×3 IMPLANT
KIT ABG SYR 3ML LUER SLIP (SYRINGE) IMPLANT
LIQUID BAND (GAUZE/BANDAGES/DRESSINGS) ×3 IMPLANT
NEEDLE HYPO 25X5/8 SAFETYGLIDE (NEEDLE) IMPLANT
NS IRRIG 1000ML POUR BTL (IV SOLUTION) ×3 IMPLANT
PACK C SECTION WH (CUSTOM PROCEDURE TRAY) ×3 IMPLANT
PAD OB MATERNITY 4.3X12.25 (PERSONAL CARE ITEMS) ×3 IMPLANT
STAPLER VISISTAT 35W (STAPLE) IMPLANT
SUT CHROMIC 0 CT 802H (SUTURE) ×3 IMPLANT
SUT CHROMIC 0 MO4 CR (SUTURE) IMPLANT
SUT CHROMIC 1 CTX 36 (SUTURE) ×9 IMPLANT
SUT CHROMIC 2 0 SH (SUTURE) ×3 IMPLANT
SUT GUT PLAIN 0 CT-3 TAN 27 (SUTURE) IMPLANT
SUT MON AB 4-0 PS1 27 (SUTURE) ×3 IMPLANT
SUT PDS AB 0 CTX 36 PDP370T (SUTURE) IMPLANT
SUT VIC AB 0 CT1 18XCR BRD8 (SUTURE) IMPLANT
SUT VIC AB 0 CT1 8-18 (SUTURE)
SUT VIC AB 0 CTX 36 (SUTURE) ×4
SUT VIC AB 0 CTX36XBRD ANBCTRL (SUTURE) ×2 IMPLANT
TOWEL OR 17X24 6PK STRL BLUE (TOWEL DISPOSABLE) ×3 IMPLANT
TRAY FOLEY CATH SILVER 14FR (SET/KITS/TRAYS/PACK) ×3 IMPLANT

## 2015-01-13 NOTE — Transfer of Care (Signed)
Immediate Anesthesia Transfer of Care Note  Patient: Mia Wilkinson  Procedure(s) Performed: Procedure(s): REPEAT CESAREAN SECTION (N/A)  Patient Location: PACU  Anesthesia Type:Spinal and Epidural  Level of Consciousness: awake, alert , oriented and patient cooperative  Airway & Oxygen Therapy: Patient Spontanous Breathing  Post-op Assessment: Report given to RN and Post -op Vital signs reviewed and stable  Post vital signs: Reviewed and stable  Last Vitals:  Filed Vitals:   01/13/15 0654  BP: 101/78  Pulse: 80  Temp: 36.6 C  Resp: 20    Complications: No apparent anesthesia complications

## 2015-01-13 NOTE — Anesthesia Postprocedure Evaluation (Signed)
  Anesthesia Post-op Note  Patient: Mia Wilkinson  Procedure(s) Performed: Procedure(s) (LRB): REPEAT CESAREAN SECTION (N/A)  Patient Location: PACU  Anesthesia Type: Epidural  Level of Consciousness: awake and alert   Airway and Oxygen Therapy: Patient Spontanous Breathing  Post-op Pain: mild  Post-op Assessment: Post-op Vital signs reviewed, Patient's Cardiovascular Status Stable, Respiratory Function Stable, Patent Airway and No signs of Nausea or vomiting  Last Vitals:  Filed Vitals:   01/13/15 1115  BP: 103/58  Pulse: 77  Temp:   Resp: 17    Post-op Vital Signs: stable   Complications: No apparent anesthesia complications

## 2015-01-13 NOTE — Addendum Note (Signed)
Addendum  created 01/13/15 1424 by Shanon PayorSuzanne M Tkai Serfass, CRNA   Modules edited: Notes Section   Notes Section:  File: [id number]

## 2015-01-13 NOTE — H&P (Signed)
There has been no change in patients condition since the original dictation

## 2015-01-13 NOTE — Anesthesia Postprocedure Evaluation (Signed)
  Anesthesia Post-op Note  Patient: Mia Wilkinson  Procedure(s) Performed: Procedure(s): REPEAT CESAREAN SECTION (N/A)  Patient Location: Mother/Baby  Anesthesia Type:Epidural  Level of Consciousness: awake, alert  and oriented  Airway and Oxygen Therapy: Patient Spontanous Breathing  Post-op Pain: none  Post-op Assessment: Post-op Vital signs reviewed, Patient's Cardiovascular Status Stable, Respiratory Function Stable, No signs of Nausea or vomiting, No headache, No backache, No residual numbness and No residual motor weakness  Post-op Vital Signs: Reviewed and stable  Last Vitals:  Filed Vitals:   01/13/15 1407  BP: 106/63  Pulse: 73  Temp: 36.8 C  Resp: 18    Complications: No apparent anesthesia complications

## 2015-01-13 NOTE — Op Note (Signed)
Preop diagnosis previous C-section 4 at 39 weeks Postop diagnosis repeat low transverse cesarean section Surgeon Dr. Francoise CeoBernard Haedyn Breau First assistant Dr. Coral Ceoharles Harper Anesthesia spinal Procedure patient placed on the operating table after the abdomen and vagina prepped and draped bladder emptied with a Foley catheter a transverse suprapubic incision made  Through the  old scar carried down to the rectus fascia fascia cleaned and incised length of the incision recti muscles retracted laterally peritoneum incised longitudinally transverse incision made on the visceroperitoneum above the bladder bladder mobilized inferiorly it was noted  There was a  a window and using Allis the membranes were ruptured fluid clear the incision extended bluntly patient delivered from the Op  position of a female Apgar 9 and 10 placenta was fundal removed manually and sent to labor and delivery uterine cavity clean  With a dry lap  uterine incision closed in 2 layers with continuous   one chromic hemostasis was satisfactory lap and sponge counts correct abdomen closed in layers peritoneum continuous double chromic fascia continuous with of 0 Dexon and the skin closed with 4-0 Monocryl blood loss was 600 cc patient tolerated the procedure well signed by Dr. Francoise CeoBernard Gannon Heinzman

## 2015-01-13 NOTE — Anesthesia Procedure Notes (Signed)
Epidural Patient location during procedure: OB Start time: 01/13/2015 7:35 AM  Staffing Anesthesiologist: Karie SchwalbeJUDD, Tyrek Lawhorn Performed by: anesthesiologist   Preanesthetic Checklist Completed: patient identified, site marked, surgical consent, pre-op evaluation, timeout performed, IV checked, risks and benefits discussed and monitors and equipment checked  Epidural Patient position: sitting Prep: site prepped and draped and DuraPrep Patient monitoring: continuous pulse ox and blood pressure Approach: midline Location: L3-L4 Injection technique: LOR saline  Needle:  Needle type: Tuohy  Needle gauge: 17 G Needle length: 9 cm and 9 Needle insertion depth: 7 cm Catheter type: closed end flexible Catheter size: 19 Gauge Catheter at skin depth: 12 cm Test dose: negative  Assessment Events: blood not aspirated, injection not painful, no injection resistance, negative IV test and no paresthesia  Additional Notes Patient identified. Risks/Benefits/Options discussed with patient including but not limited to bleeding, infection, nerve damage, paralysis, failed block, incomplete pain control, headache, blood pressure changes, nausea, vomiting, reactions to medication both or allergic, itching and postpartum back pain. Confirmed with CRNA the patient's most recent platelet count. Confirmed with patient that they are not currently taking any anticoagulation, have any bleeding history or any family history of bleeding disorders. Patient expressed understanding and wished to proceed. All questions were answered. Sterile technique was used throughout the entire procedure. Please see nursing notes for vital signs. Test dose was given through epidural catheter and negative. Warning signs of high block given to the patient including shortness of breath, tingling/numbness in hands, complete motor block, or any concerning symptoms with instructions to call for help. Patient was given instructions on fall risk and  not to get out of bed. All questions and concerns addressed with instructions to call with any issues or inadequate analgesia.   This was a CSE. After LOR and prior to threading catheter, a 25ga spinal needle was passed through the tuohy and clear CSF return on first pass. Spinal needle removed and catheter thread.  -MJ

## 2015-01-14 ENCOUNTER — Encounter (HOSPITAL_COMMUNITY): Payer: Self-pay | Admitting: Obstetrics

## 2015-01-14 LAB — CBC
HEMATOCRIT: 26.2 % — AB (ref 36.0–46.0)
HEMOGLOBIN: 8 g/dL — AB (ref 12.0–15.0)
MCH: 23.5 pg — AB (ref 26.0–34.0)
MCHC: 30.5 g/dL (ref 30.0–36.0)
MCV: 76.8 fL — ABNORMAL LOW (ref 78.0–100.0)
Platelets: 158 10*3/uL (ref 150–400)
RBC: 3.41 MIL/uL — ABNORMAL LOW (ref 3.87–5.11)
RDW: 17.8 % — ABNORMAL HIGH (ref 11.5–15.5)
WBC: 13 10*3/uL — ABNORMAL HIGH (ref 4.0–10.5)

## 2015-01-14 LAB — BIRTH TISSUE RECOVERY COLLECTION (PLACENTA DONATION)

## 2015-01-14 NOTE — Progress Notes (Signed)
Patient ID: Mia Wilkinson, female   DOB: 18-Feb-1986, 29 y.o.   MRN: [id number] Postop day 1 Blood pressure management over 63 respiration 18 pulse 69 output good Abdomen soft Dressing dry Lochia moderate Legs negative Doing well

## 2015-01-15 NOTE — Progress Notes (Signed)
Patient ID: Mia Wilkinson, female   DOB: 09/07/1985, 29 y.o.   MRN: [id number] Postop day 2 Blood pressure 118/56 respiration 18 pulse 84 Patient has no complaints Abdomen soft dressing dry Legs negative Doing well

## 2015-01-16 NOTE — Progress Notes (Signed)
  CLINICAL SOCIAL WORK MATERNAL/CHILD NOTE  Patient Details  Name: Mia Wilkinson MRN: [id number] Date of Birth: 01/13/2016  Date:  01/16/2015  Clinical Social Worker Initiating Note:  Medha Pippen, LCSW Date/ Time Initiated:  01/16/15/1000     Legal Guardian:  Teressa Hem (mother) and Craig Jones (father)  Need for Interpreter:  None   Date of Referral:  01/15/15     Reason for Referral:  Irritability, limited range in affect, concern about bonding  Referral Source:  Central Nursery   Address:  [address] [place], [place] [address]  Phone number:  [phone]   Household Members:  Minor Children, Significant Other   Natural Supports (not living in the home):  Immediate Family- mother   Professional Supports: None   Employment: Full-time   Financial Resources:    WIC, Food Stamps  Other Resources:    None reported  Cultural/Religious Considerations Which May Impact Care:  None reported  Strengths:  Ability to meet basic needs , Home prepared for child , Pediatrician chosen    Risk Factors/Current Problems:   1) MOB has been irritable with staff at the hospital.  She appears to have minimal interest in staff when meeting with them as she is usually focused on her cell phone.   Cognitive State:  Able to Concentrate , Alert , Linear Thinking    Mood/Affect:  Euthymic    CSW Assessment:  MOB was difficult to engage.  She was noted to be holding the infant, attending to the infant, and using her cell phone during the visit. She maintained minimal eye contact and only asked questions when directly asked.  MOB was not forthcoming with information, appeared guarded, but was respectful during CSW visit.  MOB did smile during a few interaction.  MOB denied questions, concerns, or needs as she prepares to discharge home. She stated that she has support from the FOB and her mother, and shared that she will be staying with her mother for extra support as she recovers  from her C-Section.  MOB denied mental health history or a history of postpartum depression.  MOB reported not feeling stressed despite being a mother and employed.  CSW reviewed risk and protective factors of PPD, and MOB denied presence of mental health symptoms during the pregnancy or now as she transitions to the postpartum period. MOB agreed to contact her doctor if she notes signs and symptoms of postpartum depression.    CSW Plan/Description:   1)Patient/Family Education-- postpartum depression 2)No Further Intervention Required/No Barriers to Discharge    Xinyi Batton N, LCSW 01/16/2015, 11:24 AM  

## 2015-01-16 NOTE — Progress Notes (Signed)
RN in room and motrin given. Pt on phone nad upset with caller.  PT STATES PAIN SCALE 10- ALTHOUGH PT SITTING WATCHING TV CALMLY AND  HOLDING INFANT. PT STATES "i am  waiting for motrin to kick in."

## 2015-01-16 NOTE — Progress Notes (Signed)
Patient ID: Mia Wilkinson, female   DOB: [birth year], 29 y.o.   MRN: [id number] Postop day 3 Blood pressure 09/25/1965 pulse 77 respiration 20 No complaints Incision clean and dry Legs negative doing well home today

## 2015-01-16 NOTE — Discharge Summary (Signed)
Obstetric Discharge Summary Reason for Admission: cesarean section Prenatal Procedures: none Intrapartum Procedures: cesarean: low cervical, transverse Postpartum Procedures: none Complications-Operative and Postpartum: none HEMOGLOBIN  Date Value Ref Range Status  01/14/2015 8.0* 12.0 - 15.0 g/dL Final   HCT  Date Value Ref Range Status  01/14/2015 26.2* 36.0 - 46.0 % Final    Physical Exam:  General: alert Lochia: appropriate Uterine Fundus: firm Incision: healing well DVT Evaluation: No evidence of DVT seen on physical exam.  Discharge Diagnoses: Term Pregnancy-delivered  Discharge Information: Date: 01/16/2015 Activity: pelvic rest Diet: routine Medications: Percocet Condition: improved Instructions: refer to practice specific booklet Discharge to: home Follow-up Information    Follow up with Kathreen CosierMARSHALL,BERNARD A, MD.   Specialty:  Obstetrics and Gynecology   Contact information:   [address] [place] [id number] [phone]       Newborn Data: Live born female  Birth Weight: 6 lb 13.2 oz (3095 g) APGAR: 9, 10  Home with mother.  MARSHALL,BERNARD A 01/16/2015, 6:21 AM

## 2015-01-16 NOTE — Discharge Instructions (Signed)
Discharge instructions   You can wash your hair  Shower  Eat what you want  Drink what you want  See me in 6 weeks  Your ankles are going to swell more in the next 2 weeks than when pregnant  No sex for 6 weeks   [place] Convery A, MD 01/16/2015

## 2015-01-16 NOTE — Progress Notes (Signed)
rn IN ROOM AND PT AWAKE. PT ASKED RN WHEN SHE COULD HAVE SOME MORE PAIN MEDICATION. RN RESPONDED BY SAYING SHE COULD HAVE 1 PERCOCET NOW BUT IT WOULD BE 4 HOURS BEFORE IT COULD BE REPEATED. PT STATED" THAT PERCOCET DOES NOT DO ANY GOOD, I THINK THE MOTRIN DOES BETTER, WHEN CAN I HAVE SOME MORE MOTRIN." EXPLAINED TO PATIENT SHE CAN NOT HAVE NAY MORE MOTRIN BEFORE 5-6 AM. ONLY GIVEN EVERY 6 HOURS. PT AGREED TO BE REEVALUATED IN TWO HOURS NAD 2 PERCOCET S GIVEN IF NEEDED.

## 2016-02-18 ENCOUNTER — Encounter (HOSPITAL_COMMUNITY): Payer: Self-pay

## 2016-02-18 ENCOUNTER — Emergency Department (HOSPITAL_COMMUNITY)
Admission: EM | Admit: 2016-02-18 | Discharge: 2016-02-19 | Disposition: A | Discharge status: Home / Self Care | Payer: Medicaid Other | Attending: Emergency Medicine | Admitting: Emergency Medicine

## 2016-02-18 DIAGNOSIS — R51 Headache: Secondary | ICD-10-CM | POA: Diagnosis not present

## 2016-02-18 DIAGNOSIS — R110 Nausea: Secondary | ICD-10-CM | POA: Insufficient documentation

## 2016-02-18 DIAGNOSIS — R1013 Epigastric pain: Secondary | ICD-10-CM | POA: Insufficient documentation

## 2016-02-18 LAB — COMPREHENSIVE METABOLIC PANEL
ALBUMIN: 3.8 g/dL (ref 3.5–5.0)
ALK PHOS: 65 U/L (ref 38–126)
ALT: 35 U/L (ref 14–54)
AST: 22 U/L (ref 15–41)
Anion gap: 7 (ref 5–15)
BILIRUBIN TOTAL: 0.5 mg/dL (ref 0.3–1.2)
BUN: 12 mg/dL (ref 6–20)
CALCIUM: 8.3 mg/dL — AB (ref 8.9–10.3)
CO2: 25 mmol/L (ref 22–32)
Chloride: 104 mmol/L (ref 101–111)
Creatinine, Ser: 0.59 mg/dL (ref 0.44–1.00)
GFR calc Af Amer: >60 mL/min (ref >60)
GFR calc non Af Amer: >60 mL/min (ref >60)
GLUCOSE: 105 mg/dL — AB (ref 65–99)
Potassium: 3.5 mmol/L (ref 3.5–5.1)
Sodium: 136 mmol/L (ref 135–145)
TOTAL PROTEIN: 8.4 g/dL — AB (ref 6.5–8.1)

## 2016-02-18 LAB — CBC
HCT: 35.4 % — ABNORMAL LOW (ref 36.0–46.0)
Hemoglobin: 10.9 g/dL — ABNORMAL LOW (ref 12.0–15.0)
MCH: 23.4 pg — AB (ref 26.0–34.0)
MCHC: 30.8 g/dL (ref 30.0–36.0)
MCV: 76 fL — ABNORMAL LOW (ref 78.0–100.0)
Platelets: 205 10*3/uL (ref 150–400)
RBC: 4.66 MIL/uL (ref 3.87–5.11)
RDW: 16.3 % — AB (ref 11.5–15.5)
WBC: 6.5 10*3/uL (ref 4.0–10.5)

## 2016-02-18 LAB — URINE MICROSCOPIC-ADD ON

## 2016-02-18 LAB — URINALYSIS, ROUTINE W REFLEX MICROSCOPIC
Bilirubin Urine: NEGATIVE
GLUCOSE, UA: NEGATIVE mg/dL
Ketones, ur: NEGATIVE mg/dL
NITRITE: NEGATIVE
Protein, ur: NEGATIVE mg/dL
SPECIFIC GRAVITY, URINE: 1.033 — AB (ref 1.005–1.030)
pH: 6.5 (ref 5.0–8.0)

## 2016-02-18 LAB — POC URINE PREG, ED: Preg Test, Ur: NEGATIVE

## 2016-02-18 LAB — LIPASE, BLOOD: Lipase: 24 U/L (ref 11–51)

## 2016-02-18 NOTE — ED Notes (Signed)
Patient c/o stomach pain (mid epigatric), nausea, and HA that began today.  Patient states that she took Excedrin 250 mg today around 1100 this morning.   Denies vomiting, Denies chest pain and SOB.  Denies diarrhea.  Patient rates pain 9/10.

## 2016-02-18 NOTE — ED Notes (Signed)
Patient sent to urine for sample.

## 2016-02-19 MED ORDER — PROMETHAZINE HCL 25 MG PO TABS
25.0000 mg | ORAL_TABLET | Freq: Once | ORAL | Status: AC
  Administered 2016-02-19: 25 mg via ORAL
  Filled 2016-02-19: qty 1

## 2016-02-19 MED ORDER — OXYCODONE-ACETAMINOPHEN 5-325 MG PO TABS
1.0000 | ORAL_TABLET | Freq: Once | ORAL | Status: AC
  Administered 2016-02-19: 1 via ORAL
  Filled 2016-02-19: qty 1

## 2016-02-19 MED ORDER — DICYCLOMINE HCL 20 MG PO TABS: 20.0000 mg | ORAL_TABLET | Freq: Three times a day (TID) | ORAL | Status: DC

## 2016-02-19 MED ORDER — FAMOTIDINE 20 MG PO TABS
40.0000 mg | ORAL_TABLET | Freq: Once | ORAL | Status: AC
  Administered 2016-02-19: 40 mg via ORAL
  Filled 2016-02-19: qty 2

## 2016-02-19 MED ORDER — PROMETHAZINE HCL 25 MG PO TABS: 25.0000 mg | ORAL_TABLET | Freq: Four times a day (QID) | ORAL | Status: DC | PRN

## 2016-02-19 NOTE — Discharge Instructions (Signed)

## 2016-02-19 NOTE — ED Provider Notes (Signed)
CSN: [id number]     Arrival date & time 02/18/16  2131 History  By signing my name below, I, Jasmyn B. Alexander, attest that this documentation has been prepared under the direction and in the presence of Gilda Crease, MD.  Electronically Signed: Gillis Ends. Lyn Hollingshead, ED Scribe. 02/19/2016. 12:27 AM.     Chief Complaint  Patient presents with  . Abdominal Pain  . Nausea    The history is provided by the patient. No language interpreter was used.    HPI Comments: Mia Wilkinson is a 30 y.o. female who presents to the Emergency Department complaining of gradual onset, constant, 9/10, epigastric pain x 1 day. Pt reports having associated nausea and headaches. She reports taking  of Excedrin with mild relief. She notes that pain does not radiate below bellybutton or to the lower back. Pt reports that she has been in sick contact with boyfriend and son. She denies any history of acid reflux, stomach ulcers, or gallbladder removal. Denies any dysuria, burning, vaginal discharge, SOB,CP, diarrhea, vomiting, or hematuria.  Past Medical History  Diagnosis Date  . GERD (gastroesophageal reflux disease)     with pregnancy  . Anemia    Past Surgical History  Procedure Laterality Date  . Cesarean section      x 3  . Cesarean section N/A 12/18/2013    Procedure: Repeat CESAREAN SECTION;  Surgeon: Kathreen Cosier, MD;  Location: [facility];  Service: Obstetrics;  Laterality: N/A;  . Cesarean section N/A 01/13/2015    Procedure: REPEAT CESAREAN SECTION;  Surgeon: Kathreen Cosier, MD;  Location: [facility];  Service: Obstetrics;  Laterality: N/A;   Family History  Problem Relation Age of Onset  . Hypertension Mother    Social History  Substance Use Topics  . Smoking status: Never Smoker   . Smokeless tobacco: Never Used  . Alcohol Use: No   OB History    Gravida Para Term Preterm AB TAB SAB Ectopic Multiple Living   0 0 0 0 0 0 4     Review of Systems  Constitutional:  Negative for fever.  Respiratory: Negative for shortness of breath.   Cardiovascular: Negative for chest pain.  Gastrointestinal: Positive for nausea and abdominal pain. Negative for vomiting and diarrhea.  Genitourinary: Negative for dysuria, frequency, hematuria and vaginal discharge.  Neurological: Positive for headaches.  All other systems reviewed and are negative.   Allergies  Review of patient's allergies indicates no known allergies.  Home Medications   Prior to Admission medications   Medication Sig Start Date End Date Taking? Authorizing Provider  dicyclomine (BENTYL) 20 MG tablet Take 1 tablet (20 mg total) by mouth 4 (four) times daily -  before meals and at bedtime. 02/19/16   Gilda Crease, MD  promethazine (PHENERGAN) 25 MG tablet Take 1 tablet (25 mg total) by mouth every 6 (six) hours as needed for nausea or vomiting. 02/19/16   Gilda Crease, MD   BP 101/71 mmHg  Pulse 91  Temp(Src) 98.5 F (36.9 C) (Oral)  Resp 15  SpO2 99%  LMP 02/08/2016 (Exact Date) Physical Exam  Constitutional: She is oriented to person, place, and time. She appears well-developed and well-nourished. No distress.  HENT:  Head: Normocephalic and atraumatic.  Right Ear: Hearing normal.  Left Ear: Hearing normal.  Nose: Nose normal.  Mouth/Throat: Oropharynx is clear and moist and mucous membranes are normal.  Eyes: Conjunctivae and EOM are normal. Pupils are equal, round,  and reactive to light.  Neck: Normal range of motion. Neck supple.  Cardiovascular: Regular rhythm, S1 normal and S2 normal.  Exam reveals no gallop and no friction rub.   No murmur heard. Pulmonary/Chest: Effort normal and breath sounds normal. No respiratory distress. She exhibits no tenderness.  Abdominal: Soft. Normal appearance and bowel sounds are normal. There is no hepatosplenomegaly. There is tenderness. There is no rebound, no guarding, no tenderness at McBurney's point and negative Murphy's sign.  No hernia.  Epigastric tenderness  Musculoskeletal: Normal range of motion.  Neurological: She is alert and oriented to person, place, and time. She has normal strength. No cranial nerve deficit or sensory deficit. Coordination normal. GCS eye subscore is 4. GCS verbal subscore is 5. GCS motor subscore is 6.  Skin: Skin is warm, dry and intact. No rash noted. No cyanosis.  Psychiatric: She has a normal mood and affect. Her speech is normal and behavior is normal. Thought content normal.  Nursing note and vitals reviewed.   ED Course  Procedures (including critical care time) DIAGNOSTIC STUDIES: Oxygen Saturation is 99% on RA, normal by my interpretation.    COORDINATION OF CARE: 12:23 AM-Discussed treatment plan which includes Lipase, CBC panel, CMP, and UA with pt at bedside and pt agreed to plan.   Labs Review Labs Reviewed  COMPREHENSIVE METABOLIC PANEL - Abnormal; Notable for the following:    Glucose, Bld 105 (*)    Calcium 8.3 (*)    Total Protein 8.4 (*)    All other components within normal limits  CBC - Abnormal; Notable for the following:    Hemoglobin 10.9 (*)    HCT 35.4 (*)    MCV 76.0 (*)    MCH 23.4 (*)    RDW 16.3 (*)    All other components within normal limits  URINALYSIS, ROUTINE W REFLEX MICROSCOPIC (NOT [facility]) - Abnormal; Notable for the following:    APPearance CLOUDY (*)    Specific Gravity, Urine 1.033 (*)    Hgb urine dipstick LARGE (*)    Leukocytes, UA SMALL (*)    All other components within normal limits  URINE MICROSCOPIC-ADD ON - Abnormal; Notable for the following:    Squamous Epithelial / LPF 6-30 (*)    Bacteria, UA MANY (*)    All other components within normal limits  URINE CULTURE  LIPASE, BLOOD  POC URINE PREG, ED    MDM   Final diagnoses:  Epigastric pain    Patient experiencing epigastric abdominal discomfort for 1 day. Patient reports intermittent cramping throughout the day associated with nausea. She has not had  vomiting, diarrhea or constipation. Patient denies urinary symptoms. Patient blood work is normal. Urinalysis looks contaminated. She had mild epigastric tenderness without guarding or rebound. Patient has declined IV meds and fluids. She does not want any more "needles". As her examination is very reassuring and do not feel she requires any imaging. Additionally, her significant other is here and he had similar symptoms several days ago and is improving. Also her son had nausea and vomiting earlier in the week. This is likely infectious and self-limited. Will treat symptomatically.  I personally performed the services described in this documentation, which was scribed in my presence. The recorded information has been reviewed and is accurate.     Gilda Creasehristopher J Mikael Skoda, MD 02/19/16 [phone]

## 2016-02-20 LAB — URINE CULTURE

## 2016-02-26 ENCOUNTER — Encounter (HOSPITAL_COMMUNITY): Payer: Self-pay | Admitting: *Deleted

## 2016-02-26 ENCOUNTER — Emergency Department (HOSPITAL_COMMUNITY)
Admission: EM | Admit: 2016-02-26 | Discharge: 2016-02-26 | Disposition: A | Discharge status: Home / Self Care | Payer: Medicaid Other | Attending: Emergency Medicine | Admitting: Emergency Medicine

## 2016-02-26 DIAGNOSIS — K0889 Other specified disorders of teeth and supporting structures: Secondary | ICD-10-CM | POA: Diagnosis not present

## 2016-02-26 MED ORDER — NAPROXEN 500 MG PO TABS
500.0000 mg | ORAL_TABLET | Freq: Two times a day (BID) | ORAL | Status: DC
Start: 2016-02-26 — End: 2019-04-16

## 2016-02-26 MED ORDER — PENICILLIN V POTASSIUM 500 MG PO TABS: 500.0000 mg | ORAL_TABLET | Freq: Four times a day (QID) | ORAL | Status: DC

## 2016-02-26 MED ORDER — ACETAMINOPHEN 325 MG PO TABS
650.0000 mg | ORAL_TABLET | Freq: Once | ORAL | Status: AC
  Administered 2016-02-26: 650 mg via ORAL
  Filled 2016-02-26: qty 2

## 2016-02-26 NOTE — ED Notes (Signed)
Pt complains of left lower jaw pain since yesterday. Pt states she tried Excedrin for pain without relief. Pt states she has a broken tooth, is unsure when she broke it.

## 2016-02-26 NOTE — ED Provider Notes (Signed)
CSN: [id number]     Arrival date & time 02/26/16  1043 History  By signing my name below, I, Emmanuella Mensah, attest that this documentation has been prepared under the direction and in the presence of Everlene FarrierWilliam Jeralyn Nolden, PA-C. Electronically Signed: Angelene GiovanniEmmanuella Mensah, ED Scribe. 02/26/2016. 12:25 PM.    Chief Complaint  Patient presents with  . Dental Pain   The history is provided by the patient. No language interpreter was used.   HPI Comments: Mia Wilkinson is a 30 y.o. female who presents to the Emergency Department complaining of gradually worsening moderate left lower dental pain onset yesterday. She reports associated left lower jaw pain with swelling and generalized headache. She explains that she had a broken tooth in the area one month ago. No alleviating factors noted. Pt has tried Excedrin with no relief. She denies any fever, sore throat, trouble swallowing, coughing, SOB, or neck pain.    Past Medical History  Diagnosis Date  . GERD (gastroesophageal reflux disease)     with pregnancy  . Anemia    Past Surgical History  Procedure Laterality Date  . Cesarean section      x 3  . Cesarean section N/A 12/18/2013    Procedure: Repeat CESAREAN SECTION;  Surgeon: Kathreen CosierBernard A Marshall, MD;  Location: [facility];  Service: Obstetrics;  Laterality: N/A;  . Cesarean section N/A 01/13/2015    Procedure: REPEAT CESAREAN SECTION;  Surgeon: Kathreen CosierBernard A Marshall, MD;  Location: [facility];  Service: Obstetrics;  Laterality: N/A;   Family History  Problem Relation Age of Onset  . Hypertension Mother    Social History  Substance Use Topics  . Smoking status: Never Smoker   . Smokeless tobacco: Never Used  . Alcohol Use: No   OB History    Gravida Para Term Preterm AB TAB SAB Ectopic Multiple Living   5 5 5  0 0 0 0 0 0 4     Review of Systems  Constitutional: Negative for fever and chills.  HENT: Positive for dental problem. Negative for drooling, ear discharge, ear pain, facial swelling,  mouth sores, sore throat, trouble swallowing and voice change.   Eyes: Negative for pain and visual disturbance.  Respiratory: Negative for cough and shortness of breath.   Musculoskeletal: Negative for neck pain and neck stiffness.  Skin: Negative for rash.  Neurological: Negative for headaches.      Allergies  Review of patient's allergies indicates no known allergies.  Home Medications   Prior to Admission medications   Medication Sig Start Date End Date Taking? Authorizing Provider  dicyclomine (BENTYL) 20 MG tablet Take 1 tablet (20 mg total) by mouth 4 (four) times daily -  before meals and at bedtime. 02/19/16   Gilda Creasehristopher J Pollina, MD  naproxen (NAPROSYN) 500 MG tablet Take 1 tablet (500 mg total) by mouth 2 (two) times daily with a meal. 02/26/16   Everlene FarrierWilliam Khamil Lamica, PA-C  penicillin v potassium (VEETID) 500 MG tablet Take 1 tablet (500 mg total) by mouth 4 (four) times daily. 02/26/16   Everlene FarrierWilliam Lorence Nagengast, PA-C  promethazine (PHENERGAN) 25 MG tablet Take 1 tablet (25 mg total) by mouth every 6 (six) hours as needed for nausea or vomiting. 02/19/16   Gilda Creasehristopher J Pollina, MD   BP 113/68 mmHg  Pulse 81  Temp(Src) 98.7 F (37.1 C) (Oral)  Resp 18  SpO2 99%  LMP 02/08/2016 (Exact Date) Physical Exam  Constitutional: She is oriented to person, place, and time. She appears well-developed and well-nourished.  No distress.  Non-toxic appearing.   HENT:  Head: Normocephalic and atraumatic.  Right Ear: External ear normal.  Left Ear: External ear normal.  Mouth/Throat: Oropharynx is clear and moist. No oropharyngeal exudate.  Tenderness to left lower molars.  No discharge from the mouth. No facial swelling.  Uvula is midline without edema. Soft palate rises symmetrically. No tonsillar hypertrophy or exudates. Tongue protrusion is normal. No trismus.  Several cracked lower molars on the right side, no gross abscess  Bilateral tympanic membranes are pearly-gray without erythema or loss  of landmarks.   Eyes: Conjunctivae are normal. Pupils are equal, round, and reactive to light. Right eye exhibits no discharge. Left eye exhibits no discharge.  Neck: Normal range of motion. Neck supple. No JVD present. No tracheal deviation present.  Cardiovascular: Normal rate, regular rhythm, normal heart sounds and intact distal pulses.   Pulmonary/Chest: Effort normal and breath sounds normal. No stridor. No respiratory distress.  Lymphadenopathy:    She has no cervical adenopathy.  Neurological: She is alert and oriented to person, place, and time. Coordination normal.  Skin: Skin is warm and dry. No rash noted. She is not diaphoretic. No erythema. No pallor.  Psychiatric: She has a normal mood and affect. Her behavior is normal.  Nursing note and vitals reviewed.   ED Course  Procedures (including critical care time) DIAGNOSTIC STUDIES: Oxygen Saturation is 99% on RA, normal by my interpretation.    COORDINATION OF CARE: 12:24 PM- Pt advised of plan for treatment and pt agrees. Pt will receive Penicillin and pain medication. Will provide resources for dental follow up.   MDM   Meds given in ED:  Medications  acetaminophen (TYLENOL) tablet 650 mg (not administered)    New Prescriptions   NAPROXEN (NAPROSYN) 500 MG TABLET    Take 1 tablet (500 mg total) by mouth 2 (two) times daily with a meal.   PENICILLIN V POTASSIUM (VEETID) 500 MG TABLET    Take 1 tablet (500 mg total) by mouth 4 (four) times daily.    Final diagnoses:  Pain, dental   This is a 30 y.o. female who presents to the Emergency Department complaining of gradually worsening moderate left lower dental pain onset yesterday. Patient with toothache.  No gross abscess.  Exam unconcerning for Ludwig's angina or spread of infection.  Will treat with penicillin and pain medicine.  Urged patient to follow-up with dentist.  Dental resource guide provided. I advised the patient to follow-up with their primary care provider  this week. I advised the patient to return to the emergency department with new or worsening symptoms or new concerns. The patient verbalized understanding and agreement with plan.    I personally performed the services described in this documentation, which was scribed in my presence. The recorded information has been reviewed and is accurate.       Everlene FarrierWilliam Shaynah Hund, PA-C 02/26/16 1230  Nelva Nayobert Beaton, MD 02/26/16 [phone]

## 2016-02-26 NOTE — Discharge Instructions (Signed)
Community Resource Guide Dental °The [employer] Way’s “211” is a great source of information about community services available.  Access by dialing 2-1-1 from anywhere in [place], or by website -  www.nc211.org.  ° °Other Local Resources (Updated 09/2015) ° °Dental  Care °  °Services ° °  °Phone Number and Address  °Cost  °[facility] For children 0 - 31 years of age:  °• Cleaning °• Tooth brushing/flossing instruction °• Sealants, fillings, crowns °• Extractions °• Emergency treatment  [phone] °[address] °[place], [place] [address] Charges based on family income.  Medicaid and some insurance plans accepted.   °  °[facility] [place] • Cleaning °• Sealants, fillings, crowns °• Extractions °• Emergency treatment [phone] °[address] °[place], [place] ° Pregnant women 18 years of age or older with a Medicaid card  °[facility] [place] • Cleaning °• Sealants, fillings, crowns °• Extractions °• Emergency treatment [phone] °[address] °[place], [place] Pregnant women 18 years of age or older with a Medicaid card  °[facility] For children 0 - 31 years of age:  °• Cleaning °• Tooth brushing/flossing instruction °• Sealants, fillings, crowns °• Extractions °• Emergency treatment °Limited orthodontic services for patients with Medicaid [phone] °[address] °[place], [place] [address] Medicaid and [place] Health Choice cover for children up to age 31 and pregnant women.  Parents of children up to age 31 without Medicaid pay a reduced fee at time of service.  °[facility] [place] For children 0 - 31 years of age:  °• Cleaning °• Tooth brushing/flossing instruction °• Sealants, fillings, crowns °• Extractions °• Emergency treatment °Limited orthodontic services for patients with Medicaid  [phone] °[address] °[place], [place].  Medicaid and [place] Health Choice cover for children up to age 31 and pregnant women.  Parents of children up to age 31 without Medicaid pay a reduced fee.  °[facility] • Cleaning °• Sealants, fillings, crowns °• Extractions ° °Hours: Tuesdays and Thursdays, 4:15 - 8 pm [phone] °[address] °[place], [place] [address] Services free of charge to [place] residents ages 18-64 who do not have health insurance, Medicare, Medicaid, or VA benefits and fall within federal poverty guidelines  °[facility] ° ° ° Provides dental care in addition to primary medical care, nutritional counseling, and pharmacy: °• Cleaning °• Sealants, fillings, crowns °• Extractions ° ° ° ° ° ° ° ° ° ° ° ° ° ° ° ° ° [phone] °[facility], [address] °[place], [place] ° °[phone] °[facility], [address] [place], [place] ° °[phone] °[facility] °[place], [place] ° °[phone] °[facility], [address] °[place], [place] ° °[phone] °[facility] °[address] °[place], [place] Accepts Medicaid, Medicare, most insurance.  Also provides services available to all with fees adjusted based on ability to pay.    °[facility] • Cleaning °• Tooth brushing/flossing instruction °• Sealants, fillings, crowns °• Extractions °• Emergency treatment °Hours: Tuesdays, Thursdays, and Fridays from 8 am to 5 pm by appointment only. [phone] °[address] °[place], [place] [address] [place] residents with Medicaid (depending on eligibility) and children with [place] Health Choice - call for more information.  °  [facility] • Extractions only ° °Hours: 2nd and 4th Thursday of each month from 6:30 am - 9 am.   [phone] ext. [phone] °[address] °[place], [place] [address] Ages 18 and older only.  Patients are seen on a first come, first served basis.  °[facility] • Cleanings °• Fillings °• Extractions °• Orthodontics °• Endodontics °• Implants/Crowns/Bridges °• Complete and partial dentures [phone] °[place], [place] Patients must complete an application for services.  There is often a waiting list.   °Dental Pain °Dental pain may be caused by many things, including: °· Tooth decay (cavities or caries). Cavities expose the nerve of your tooth to air and hot or cold temperatures. This can cause pain or discomfort. °· Abscess or infection. A dental abscess is a collection of infected pus from a bacterial infection in the inner part of the tooth (pulp). It usually occurs at the end of the tooth's root. °· Injury. °· An unknown reason (idiopathic). °Your pain may be mild or severe. It may only occur when: °· You are chewing. °· You are exposed to hot or cold temperature. °· You are eating or drinking sugary foods or beverages, such as soda or candy. °Your pain may also be constant. °HOME CARE INSTRUCTIONS °Watch your dental pain for any changes. The following actions may help to lessen any discomfort that you are feeling: °· Take medicines only as directed by your dentist. °· If you were prescribed an antibiotic medicine, finish all of it even if you start to feel better. °· Keep all follow-up visits as directed by your dentist. This is important. °· Do not apply heat to the outside of your face. °· Rinse your mouth or gargle with salt water if directed by your dentist. This helps with pain and swelling. °¨ You can make salt water by adding ¼ tsp of salt to 1 cup of warm water. °· Apply ice to the painful area of your face: °¨ Put ice in a plastic bag. °¨ Place a towel between your skin and the bag. °¨ Leave the ice on for 20 minutes, 2-3 times per day. °· Avoid foods or drinks that cause you pain, such as: °¨ Very hot or very cold foods or  drinks. °¨ Sweet or sugary foods or drinks. °SEEK MEDICAL CARE IF: °· Your pain is not controlled with medicines. °· Your symptoms are worse. °· You have new symptoms. °SEEK IMMEDIATE MEDICAL CARE IF: °· You are unable to open your mouth. °· You are having trouble breathing or swallowing. °· You have a fever. °· Your face, neck, or jaw is swollen. °  °This information is not intended to replace advice given to you by your health care provider. Make sure you discuss any questions you have with your health care provider. °  °Document Released: 08/22/2005 Document Revised: 01/06/2015 Document Reviewed: 08/18/2014 °Elsevier Interactive Patient Education ©2016 Elsevier Inc. ° °

## 2016-02-26 NOTE — ED Notes (Signed)
PA at bedside.

## 2016-05-28 ENCOUNTER — Encounter: Payer: Self-pay | Admitting: *Deleted

## 2016-08-13 ENCOUNTER — Encounter (HOSPITAL_COMMUNITY): Payer: Self-pay | Admitting: Emergency Medicine

## 2016-08-13 ENCOUNTER — Emergency Department (HOSPITAL_COMMUNITY)
Admission: EM | Admit: 2016-08-13 | Discharge: 2016-08-13 | Disposition: A | Discharge status: Home / Self Care | Payer: Medicaid Other | Attending: Emergency Medicine | Admitting: Emergency Medicine

## 2016-08-13 DIAGNOSIS — B3731 Acute candidiasis of vulva and vagina: Secondary | ICD-10-CM

## 2016-08-13 DIAGNOSIS — K047 Periapical abscess without sinus: Secondary | ICD-10-CM | POA: Diagnosis not present

## 2016-08-13 DIAGNOSIS — B373 Candidiasis of vulva and vagina: Secondary | ICD-10-CM | POA: Diagnosis not present

## 2016-08-13 DIAGNOSIS — N898 Other specified noninflammatory disorders of vagina: Secondary | ICD-10-CM | POA: Diagnosis present

## 2016-08-13 DIAGNOSIS — N760 Acute vaginitis: Secondary | ICD-10-CM

## 2016-08-13 DIAGNOSIS — B9689 Other specified bacterial agents as the cause of diseases classified elsewhere: Secondary | ICD-10-CM

## 2016-08-13 LAB — URINALYSIS, ROUTINE W REFLEX MICROSCOPIC
Bilirubin Urine: NEGATIVE
Glucose, UA: NEGATIVE mg/dL
Hgb urine dipstick: NEGATIVE
Ketones, ur: NEGATIVE mg/dL
Nitrite: NEGATIVE
Protein, ur: 30 mg/dL — AB
Specific Gravity, Urine: 1.02 (ref 1.005–1.030)
pH: 7 (ref 5.0–8.0)

## 2016-08-13 LAB — WET PREP, GENITAL
Sperm: NONE SEEN
TRICH WET PREP: NONE SEEN

## 2016-08-13 LAB — POC URINE PREG, ED: Preg Test, Ur: NEGATIVE

## 2016-08-13 MED ORDER — METRONIDAZOLE 500 MG PO TABS
500.0000 mg | ORAL_TABLET | Freq: Two times a day (BID) | ORAL | 0 refills | Status: DC
Start: 2016-08-13 — End: 2019-04-16

## 2016-08-13 MED ORDER — PENICILLIN V POTASSIUM 500 MG PO TABS: 500.0000 mg | ORAL_TABLET | Freq: Four times a day (QID) | ORAL | 0 refills | Status: AC

## 2016-08-13 MED ORDER — FLUCONAZOLE 150 MG PO TABS: 150.0000 mg | ORAL_TABLET | Freq: Every day | ORAL | 0 refills | Status: DC

## 2016-08-13 MED ORDER — FLUCONAZOLE 150 MG PO TABS
150.0000 mg | ORAL_TABLET | Freq: Once | ORAL | Status: AC
  Administered 2016-08-13: 150 mg via ORAL
  Filled 2016-08-13: qty 1

## 2016-08-13 NOTE — Discharge Instructions (Signed)
Take antibiotics as prescribed until all gone. Take Diflucan after you finish antibiotic for yeast infection. Please follow with a dentist. Take ibuprofen or Tylenol for pain.

## 2016-08-13 NOTE — ED Provider Notes (Signed)
[facility]-EMERGENCY DEPT Provider Note   CSN: [id number] Arrival date & time: 08/13/16  1244     History   Chief Complaint Chief Complaint  Patient presents with  . Vaginal Discharge  . Dental Pain    HPI Mia Wilkinson is a 30 y.o. female.  HPI  Mia Wilkinson is a 30 y.o. female with history of anemia and acid reflux, presents to emergency department complaining of vaginal irritation and discharge as well as dental pain. Patient states her discharge started approximately a week ago. She states she called her PCP, but he was out of town. She was told by a nurse to try Monistat, which she states she believes she developed an allergic reaction to. She states soon after putting Monistat she developed pain and burning in her vaginal canal and external genitalia. She states that the discharge has not improved so she came to the ED for evaluation. She denies any abdominal pain. No fever or chills. She reports no urinary symptoms. She is sexually active, does not use protection, states one partner only. She states she is not concerned about possible STI. Feels like prior yeast infections. No recent antibiotics. No new personal hygiene products. Patient is also complaining of right-sided dental pain. She states that she had a cavity in the right lower second molar tooth, states she was eating dinner 3 days ago and chipped a tooth. Since then pain and swelling to the tooth and the common. She has tried to call to get a dentist but states that none of the dentist take Medicaid. No facial swelling.   Past Medical History:  Diagnosis Date  . Anemia   . GERD (gastroesophageal reflux disease)    with pregnancy    Patient Active Problem List   Diagnosis Date Noted  . S/P cesarean section 12/18/2013    Past Surgical History:  Procedure Laterality Date  . CESAREAN SECTION     x 3  . CESAREAN SECTION N/A 12/18/2013   Procedure: Repeat CESAREAN SECTION;  Surgeon: Kathreen Cosier, MD;  Location:  [facility];  Service: Obstetrics;  Laterality: N/A;  . CESAREAN SECTION N/A 01/13/2015   Procedure: REPEAT CESAREAN SECTION;  Surgeon: Kathreen Cosier, MD;  Location: [facility];  Service: Obstetrics;  Laterality: N/A;    OB History    Gravida Para Term Preterm AB Living   5 5 5  0 0 4   SAB TAB Ectopic Multiple Live Births   0 0 0 0 4       Home Medications    Prior to Admission medications   Medication Sig Start Date End Date Taking? Authorizing Provider  dicyclomine (BENTYL) 20 MG tablet Take 1 tablet (20 mg total) by mouth 4 (four) times daily -  before meals and at bedtime. 02/19/16   Gilda Crease, MD  naproxen (NAPROSYN) 500 MG tablet Take 1 tablet (500 mg total) by mouth 2 (two) times daily with a meal. 02/26/16   Everlene Farrier, PA-C  penicillin v potassium (VEETID) 500 MG tablet Take 1 tablet (500 mg total) by mouth 4 (four) times daily. 02/26/16   Everlene Farrier, PA-C  promethazine (PHENERGAN) 25 MG tablet Take 1 tablet (25 mg total) by mouth every 6 (six) hours as needed for nausea or vomiting. 02/19/16   Gilda Crease, MD    Family History Family History  Problem Relation Age of Onset  . Hypertension Mother     Social History Social History  Substance Use Topics  .  Smoking status: Never Smoker  . Smokeless tobacco: Never Used  . Alcohol use No     Allergies   Patient has no known allergies.   Review of Systems Review of Systems  Constitutional: Negative for chills and fever.  HENT: Positive for dental problem. Negative for facial swelling.   Respiratory: Negative for cough, chest tightness and shortness of breath.   Cardiovascular: Negative for chest pain, palpitations and leg swelling.  Gastrointestinal: Negative for abdominal pain, diarrhea, nausea and vomiting.  Genitourinary: Positive for vaginal discharge. Negative for dysuria, flank pain, pelvic pain, vaginal bleeding and vaginal pain.  Musculoskeletal: Negative for arthralgias, myalgias,  neck pain and neck stiffness.  Skin: Negative for rash.  Neurological: Negative for dizziness, weakness and headaches.  All other systems reviewed and are negative.    Physical Exam Updated Vital Signs BP 119/70 (BP Location: Left Arm)   Pulse 71   Temp 98.2 F (36.8 C) (Oral)   Resp 18   Ht 5\' 2"  (1.575 m)   Wt 72.6 kg   LMP 07/28/2016   SpO2 100%   BMI 29.26 kg/m   Physical Exam  Constitutional: She appears well-developed and well-nourished. No distress.  HENT:  Head: Normocephalic.  Chipped right lower second molar, with surrounding gum erythema. Tooth is tender to the touch. No definite abscess. No trismus. No swelling under the tongue. No facial swelling  Eyes: Conjunctivae are normal.  Neck: Neck supple.  Cardiovascular: Normal rate, regular rhythm and normal heart sounds.   Pulmonary/Chest: Effort normal and breath sounds normal. No respiratory distress. She has no wheezes. She has no rales.  Abdominal: Soft. Bowel sounds are normal. She exhibits no distension. There is no tenderness. There is no rebound.  Genitourinary:  Genitourinary Comments: Normal external genitalia. Normal vaginal canal. Large  yellow discharge. Cervix is normal, closed. No CMT. No uterine or adnexal tenderness. No masses palpated.    Musculoskeletal: She exhibits no edema.  Neurological: She is alert.  Skin: Skin is warm and dry.  Psychiatric: She has a normal mood and affect. Her behavior is normal.  Nursing note and vitals reviewed.    ED Treatments / Results  Labs (all labs ordered are listed, but only abnormal results are displayed) Labs Reviewed  WET PREP, GENITAL  URINALYSIS, ROUTINE W REFLEX MICROSCOPIC  POC URINE PREG, ED  GC/CHLAMYDIA PROBE AMP ([place]) NOT AT [facility]    EKG  EKG Interpretation None       Radiology No results found.  Procedures Procedures (including critical care time)  Medications Ordered in ED Medications - No data to display   Initial  Impression / Assessment and Plan / ED Course  I have reviewed the triage vital signs and the nursing notes.  Pertinent labs & imaging results that were available during my care of the patient were reviewed by me and considered in my medical decision making (see chart for details).  Clinical Course     Patient with 2 separate complaints. She's complaining of vaginal discharge, and also complaining of dental pain. Patient's pelvic exam showed large discharge, otherwise no cervical motion tenderness, adnexal tenderness, uterine tenderness. Wet prep showed [place], clue cells, WBCs. Patient states she's not concerned about STI. At this time will treat with Diflucan and will place on Flagyl for BV. Patient's urine is contaminated, but she is not having any urinary symptoms, will hold off on treating. Her gonorrhea and chlamydia cultures are pending and she was explained we will notify her if return  of normal.  Patient's of dental pain is concerning for infection given broken off tooth with a dominant erythema. Will treat with penicillin. She will follow-up with her dentist. I also gave a prescription for more Diflucan to take after she finishes her antibiotics.  Vitals:   08/13/16 1251 08/13/16 1252 08/13/16 1458  BP: 119/70  104/74  Pulse: 71  82  Resp: 18  18  Temp: 98.2 F (36.8 C)  98.4 F (36.9 C)  TempSrc: Oral  Oral  SpO2: 100%  100%  Weight:  72.6 kg   Height:  5\' 2"  (1.575 m)      Final Clinical Impressions(s) / ED Diagnoses   Final diagnoses:  Candida vaginitis  BV (bacterial vaginosis)  Dental infection    New Prescriptions Discharge Medication List as of 08/13/2016  3:03 PM    START taking these medications   Details  fluconazole (DIFLUCAN) 150 MG tablet Take 1 tablet (150 mg total) by mouth daily. Take after completing antibiotics, Starting Sat 08/13/2016, Print    metroNIDAZOLE (FLAGYL) 500 MG tablet Take 1 tablet (500 mg total) by mouth 2 (two) times daily., Starting  Sat 08/13/2016, Print    penicillin v potassium (VEETID) 500 MG tablet Take 1 tablet (500 mg total) by mouth 4 (four) times daily., Starting Sat 08/13/2016, Until Sat 08/20/2016, Print         Jaynie Crumbleatyana Khristian Phillippi, PA-C 08/13/16 1531    Canary Brimhristopher J Tegeler, MD 08/13/16 [phone]

## 2016-08-13 NOTE — ED Notes (Signed)
Patient understood discharge instructions.  She is A & O x4. 

## 2016-08-13 NOTE — ED Triage Notes (Signed)
Patient reports right upper molar dental pain x3 days. Patient also c/o white vaginal discharge x1 week. Reports taking monostat with no relief. Denies abdominal pain, N/V/D.

## 2016-08-15 LAB — GC/CHLAMYDIA PROBE AMP (~~LOC~~) NOT AT ARMC
CHLAMYDIA, DNA PROBE: NEGATIVE
Neisseria Gonorrhea: NEGATIVE

## 2016-12-01 ENCOUNTER — Emergency Department (HOSPITAL_COMMUNITY)
Admission: EM | Admit: 2016-12-01 | Discharge: 2016-12-01 | Disposition: A | Discharge status: Home / Self Care | Payer: Medicaid Other | Attending: Emergency Medicine | Admitting: Emergency Medicine

## 2016-12-01 ENCOUNTER — Encounter (HOSPITAL_COMMUNITY): Payer: Self-pay

## 2016-12-01 DIAGNOSIS — Z79899 Other long term (current) drug therapy: Secondary | ICD-10-CM | POA: Insufficient documentation

## 2016-12-01 DIAGNOSIS — R1084 Generalized abdominal pain: Secondary | ICD-10-CM | POA: Diagnosis not present

## 2016-12-01 DIAGNOSIS — G43809 Other migraine, not intractable, without status migrainosus: Secondary | ICD-10-CM | POA: Insufficient documentation

## 2016-12-01 LAB — COMPREHENSIVE METABOLIC PANEL
ALT: 39 U/L (ref 14–54)
AST: 30 U/L (ref 15–41)
Albumin: 3.3 g/dL — ABNORMAL LOW (ref 3.5–5.0)
Alkaline Phosphatase: 56 U/L (ref 38–126)
Anion gap: 6 (ref 5–15)
BUN: 14 mg/dL (ref 6–20)
CHLORIDE: 106 mmol/L (ref 101–111)
CO2: 26 mmol/L (ref 22–32)
CREATININE: 0.64 mg/dL (ref 0.44–1.00)
Calcium: 8.4 mg/dL — ABNORMAL LOW (ref 8.9–10.3)
GFR calc non Af Amer: >60 mL/min (ref >60)
Glucose, Bld: 96 mg/dL (ref 65–99)
Potassium: 3.8 mmol/L (ref 3.5–5.1)
SODIUM: 138 mmol/L (ref 135–145)
Total Bilirubin: 0.4 mg/dL (ref 0.3–1.2)
Total Protein: 7.8 g/dL (ref 6.5–8.1)

## 2016-12-01 LAB — URINALYSIS, ROUTINE W REFLEX MICROSCOPIC
Bilirubin Urine: NEGATIVE
Glucose, UA: NEGATIVE mg/dL
Ketones, ur: NEGATIVE mg/dL
Nitrite: NEGATIVE
PH: 5 (ref 5.0–8.0)
Protein, ur: NEGATIVE mg/dL
SPECIFIC GRAVITY, URINE: 1.028 (ref 1.005–1.030)

## 2016-12-01 LAB — POC URINE PREG, ED: Preg Test, Ur: NEGATIVE

## 2016-12-01 LAB — CBC
HEMATOCRIT: 32.2 % — AB (ref 36.0–46.0)
Hemoglobin: 10.3 g/dL — ABNORMAL LOW (ref 12.0–15.0)
MCH: 24 pg — AB (ref 26.0–34.0)
MCHC: 32 g/dL (ref 30.0–36.0)
MCV: 74.9 fL — AB (ref 78.0–100.0)
PLATELETS: 183 10*3/uL (ref 150–400)
RBC: 4.3 MIL/uL (ref 3.87–5.11)
RDW: 17.3 % — ABNORMAL HIGH (ref 11.5–15.5)
WBC: 6.8 10*3/uL (ref 4.0–10.5)

## 2016-12-01 LAB — PREGNANCY, URINE: Preg Test, Ur: NEGATIVE

## 2016-12-01 LAB — LIPASE, BLOOD: LIPASE: 81 U/L — AB (ref 11–51)

## 2016-12-01 MED ORDER — SODIUM CHLORIDE 0.9 % IV BOLUS (SEPSIS)
500.0000 mL | Freq: Once | INTRAVENOUS | Status: AC
  Administered 2016-12-01: 500 mL via INTRAVENOUS

## 2016-12-01 MED ORDER — KETOROLAC TROMETHAMINE 15 MG/ML IJ SOLN
15.0000 mg | Freq: Once | INTRAMUSCULAR | Status: AC
  Administered 2016-12-01: 15 mg via INTRAVENOUS
  Filled 2016-12-01: qty 1

## 2016-12-01 MED ORDER — DIPHENHYDRAMINE HCL 50 MG/ML IJ SOLN
12.5000 mg | Freq: Once | INTRAMUSCULAR | Status: AC
  Administered 2016-12-01: 12.5 mg via INTRAVENOUS
  Filled 2016-12-01: qty 1

## 2016-12-01 MED ORDER — METOCLOPRAMIDE HCL 5 MG/ML IJ SOLN
10.0000 mg | Freq: Once | INTRAMUSCULAR | Status: AC
  Administered 2016-12-01: 10 mg via INTRAVENOUS
  Filled 2016-12-01: qty 2

## 2016-12-01 NOTE — ED Notes (Signed)
POC Urine is Negative.

## 2016-12-01 NOTE — ED Triage Notes (Signed)
Pt complains of abdominal pain, nausea and being jittery since yesterday Pt denies vomiting or diarrhea but states she had clear mucous discharge from her vagina today

## 2016-12-03 NOTE — ED Provider Notes (Signed)
[facility] DEPT Provider Note   CSN: [id number] Arrival date & time: 12/01/16  0023     History   Chief Complaint Chief Complaint  Patient presents with  . Abdominal Pain    HPI Mia Wilkinson is a 31 y.o. female.  Patient presents with c/o abdominal pain that is diffuse and associated with nausea without vomiting. Symptoms started yesterday and patient reports other family members with similar symptoms. No dysuria, fever, bloody stools. She denies abnormal vaginal discharge or irregular menses. No cough, congestion or SOB. She reports her symptoms have set off a migraine headache. She has a history of migraines and reports current headache is typical.   The history is provided by the patient. No language interpreter was used.    Past Medical History:  Diagnosis Date  . Anemia   . GERD (gastroesophageal reflux disease)    with pregnancy    Patient Active Problem List   Diagnosis Date Noted  . S/P cesarean section 12/18/2013    Past Surgical History:  Procedure Laterality Date  . CESAREAN SECTION     x 3  . CESAREAN SECTION N/A 12/18/2013   Procedure: Repeat CESAREAN SECTION;  Surgeon: Frederico Hamman, MD;  Location: [facility];  Service: Obstetrics;  Laterality: N/A;  . CESAREAN SECTION N/A 01/13/2015   Procedure: REPEAT CESAREAN SECTION;  Surgeon: Frederico Hamman, MD;  Location: [facility];  Service: Obstetrics;  Laterality: N/A;    OB History    Gravida Para Term Preterm AB Living   _0 0 0 4   SAB TAB Ectopic Multiple Live Births   0 0 0 0 4       Home Medications    Prior to Admission medications   Medication Sig Start Date End Date Taking? Authorizing Provider  dicyclomine (BENTYL) 20 MG tablet Take 1 tablet (20 mg total) by mouth 4 (four) times daily -  before meals and at bedtime. Patient not taking: Reported on 08/13/2016 02/19/16   Orpah Greek, MD  fluconazole (DIFLUCAN) 150 MG tablet Take 1 tablet (150 mg total) by mouth daily. Take  after completing antibiotics 08/13/16   Tatyana Kirichenko, PA-C  metroNIDAZOLE (FLAGYL) 500 MG tablet Take 1 tablet (500 mg total) by mouth 2 (two) times daily. 08/13/16   Tatyana Kirichenko, PA-C  miconazole (MONISTAT 1 COMBINATION PACK) kit Place 1 each vaginally once.    Historical Provider, MD  naproxen (NAPROSYN) 500 MG tablet Take 1 tablet (500 mg total) by mouth 2 (two) times daily with a meal. Patient not taking: Reported on 08/13/2016 02/26/16   Waynetta Pean, PA-C    Family History Family History  Problem Relation Age of Onset  . Hypertension Mother     Social History Social History  Substance Use Topics  . Smoking status: Never Smoker  . Smokeless tobacco: Never Used  . Alcohol use No     Allergies   Patient has no known allergies.   Review of Systems Review of Systems  Constitutional: Negative for chills and fever.  HENT: Negative.   Respiratory: Negative.   Cardiovascular: Negative.   Gastrointestinal: Positive for abdominal pain and nausea. Negative for diarrhea and vomiting.  Genitourinary: Negative for dysuria.  Musculoskeletal: Negative.  Negative for myalgias.  Skin: Negative.   Neurological: Positive for headaches.     Physical Exam Updated Vital Signs BP 110/72   Pulse 82   Temp 98.3 F (36.8 C) (Oral)   Resp 18   Ht _1  (1.575  m)   Wt 78.5 kg   LMP 11/08/2016   SpO2 100%   BMI 31.64 kg/m   Physical Exam  Constitutional: She is oriented to person, place, and time. She appears well-developed and well-nourished.  HENT:  Head: Normocephalic.  Neck: Normal range of motion. Neck supple.  Cardiovascular: Normal rate and regular rhythm.   Pulmonary/Chest: Effort normal and breath sounds normal.  Abdominal: Soft. Bowel sounds are normal. She exhibits no distension. There is tenderness (Diffuse tenderness.). There is no rebound and no guarding.  Musculoskeletal: Normal range of motion.  Neurological: She is alert and oriented to person, place,  and time. No cranial nerve deficit or sensory deficit. She exhibits normal muscle tone. Coordination normal.  Skin: Skin is warm and dry. No rash noted.  Psychiatric: She has a normal mood and affect.  Nursing note and vitals reviewed.    ED Treatments / Results  Labs (all labs ordered are listed, but only abnormal results are displayed) Labs Reviewed  URINALYSIS, ROUTINE W REFLEX MICROSCOPIC - Abnormal; Notable for the following:       Result Value   APPearance CLOUDY (*)    Hgb urine dipstick SMALL (*)    Leukocytes, UA SMALL (*)    Bacteria, UA RARE (*)    Squamous Epithelial / LPF TOO NUMEROUS TO COUNT (*)    All other components within normal limits  LIPASE, BLOOD - Abnormal; Notable for the following:    Lipase 81 (*)    All other components within normal limits  COMPREHENSIVE METABOLIC PANEL - Abnormal; Notable for the following:    Calcium 8.4 (*)    Albumin 3.3 (*)    All other components within normal limits  CBC - Abnormal; Notable for the following:    Hemoglobin 10.3 (*)    HCT 32.2 (*)    MCV 74.9 (*)    MCH 24.0 (*)    RDW 17.3 (*)    All other components within normal limits  PREGNANCY, URINE  POC URINE PREG, ED    EKG  EKG Interpretation None       Radiology No results found.  Procedures Procedures (including critical care time)  Medications Ordered in ED Medications  sodium chloride 0.9 % bolus 500 mL (0 mLs Intravenous Stopped 12/01/16 0427)  metoCLOPramide (REGLAN) injection 10 mg (10 mg Intravenous Given 12/01/16 0311)  diphenhydrAMINE (BENADRYL) injection 12.5 mg (12.5 mg Intravenous Given 12/01/16 0309)  ketorolac (TORADOL) 15 MG/ML injection 15 mg (15 mg Intravenous Given 12/01/16 0308)     Initial Impression / Assessment and Plan / ED Course  I have reviewed the triage vital signs and the nursing notes.  Pertinent labs & imaging results that were available during my care of the patient were reviewed by me and considered in my medical  decision making (see chart for details).     The patient is having abdominal pain and nausea, similar to multiple family members. Labs are reassuring. VSS. Patient also c/o migraine headache she felt started secondary to other symptoms. Headache cocktail is provided with relief of symptoms.   She can be discharged home with symptomatic relief. Return precautions discussed.   Final Clinical Impressions(s) / ED Diagnoses   Final diagnoses:  Generalized abdominal pain  Other migraine without status migrainosus, not intractable    New Prescriptions Discharge Medication List as of 12/01/2016  4:27 AM       Charlann Lange, PA-C 12/03/16 0145    Sherwood Gambler, MD 12/03/16 1645

## 2017-07-13 ENCOUNTER — Ambulatory Visit: Payer: Self-pay | Admitting: Obstetrics

## 2017-08-01 ENCOUNTER — Ambulatory Visit: Payer: Self-pay | Admitting: Obstetrics

## 2018-02-08 ENCOUNTER — Ambulatory Visit: Payer: Medicaid Other | Admitting: Obstetrics

## 2018-03-05 ENCOUNTER — Other Ambulatory Visit (HOSPITAL_COMMUNITY)
Admission: RE | Admit: 2018-03-05 | Discharge: 2018-03-05 | Disposition: A | Discharge status: Home / Self Care | Payer: Medicaid Other | Source: Ambulatory Visit | Attending: Obstetrics | Admitting: Obstetrics

## 2018-03-05 ENCOUNTER — Ambulatory Visit: Payer: Medicaid Other | Admitting: Obstetrics

## 2018-03-05 ENCOUNTER — Encounter: Payer: Self-pay | Admitting: Obstetrics

## 2018-03-05 VITALS — BP 111/76 | HR 74 | Ht 62.0 in | Wt 201.0 lb

## 2018-03-05 DIAGNOSIS — Z3202 Encounter for pregnancy test, result negative: Secondary | ICD-10-CM | POA: Diagnosis not present

## 2018-03-05 DIAGNOSIS — Z01419 Encounter for gynecological examination (general) (routine) without abnormal findings: Secondary | ICD-10-CM

## 2018-03-05 DIAGNOSIS — N926 Irregular menstruation, unspecified: Secondary | ICD-10-CM

## 2018-03-05 DIAGNOSIS — Z0000 Encounter for general adult medical examination without abnormal findings: Secondary | ICD-10-CM | POA: Diagnosis not present

## 2018-03-05 DIAGNOSIS — Z1151 Encounter for screening for human papillomavirus (HPV): Secondary | ICD-10-CM | POA: Diagnosis not present

## 2018-03-05 DIAGNOSIS — R350 Frequency of micturition: Secondary | ICD-10-CM

## 2018-03-05 DIAGNOSIS — D508 Other iron deficiency anemias: Secondary | ICD-10-CM

## 2018-03-05 DIAGNOSIS — E669 Obesity, unspecified: Secondary | ICD-10-CM

## 2018-03-05 LAB — POCT URINE PREGNANCY: PREG TEST UR: NEGATIVE

## 2018-03-05 MED ORDER — CITRANATAL BLOOM 90-1 MG PO TABS: 1.0000 | ORAL_TABLET | Freq: Every day | ORAL | 11 refills | Status: DC

## 2018-03-05 MED ORDER — NITROFURANTOIN MONOHYD MACRO 100 MG PO CAPS: 100.0000 mg | ORAL_CAPSULE | Freq: Two times a day (BID) | ORAL | 0 refills | Status: DC

## 2018-03-05 NOTE — Progress Notes (Signed)
NGYN patient present for Annual Exam  LMP: Early May no cycle since Neg UPT at home yesterday.  Contraception: None STD: Declines Last pap: ?    CC: Possible UTI ,pt has had urinary urgency x 2 wks now.Took AZO OTC. Denies any dysuria, no bleeding and only pain when holding urine for a long time.  UPT: NEGATIVE  UA: Not done due to AZO unable to read results.     Subjective:        Mia Wilkinson is a 32 y.o. female here for a routine exam.  Current complaints: MISSED PERIOD IN jUNE.    Personal health questionnaire:  Is patient Ashkenazi Jewish, have a family history of breast and/or ovarian cancer: no Is there a family history of uterine cancer diagnosed at age < 47, gastrointestinal cancer, urinary tract cancer, family member who is a Field seismologist syndrome-associated carrier: no Is the patient overweight and hypertensive, family history of diabetes, personal history of gestational diabetes, preeclampsia or PCOS: no Is patient over 44, have PCOS,  family history of premature CHD under age 93, diabetes, smoke, have hypertension or peripheral artery disease:  no At any time, has a partner hit, kicked or otherwise hurt or frightened you?: no Over the past 2 weeks, have you felt down, depressed or hopeless?: no Over the past 2 weeks, have you felt little interest or pleasure in doing things?:no   Gynecologic History Patient's last menstrual period was 01/09/2018 (approximate). Contraception: none Last Pap: 2015 . Results were: normal Last mammogram: N/A. Results were: N/A  Obstetric History OB History  Gravida Para Term Preterm AB Living  _0 0 0 4  SAB TAB Ectopic Multiple Live Births  0 0 0 0 4    # Outcome Date GA Lbr Len/2nd Weight Sex Delivery Anes PTL Lv  5 Term 01/13/15 [redacted]w[redacted]d 6 lb 13.2 oz (3.095 kg) M CS-LTranv Spinal  LIV  4 Term 12/18/13 [id number] lb 15.8 oz (2.715 kg) M CS-LVertical Spinal  LIV  3 Term 2012 3938w0d lb 2 oz (2.778 kg) M CS-LTranv     2 Term 2009    7 lb 6 oz (3.345 kg) M CS-LTranv   LIV  1 Term 2006 [id number] 2 oz (3.685 kg) M CS-LTranv   LIV    Past Medical History:  Diagnosis Date  . Anemia   . GERD (gastroesophageal reflux disease)    with pregnancy    Past Surgical History:  Procedure Laterality Date  . CESAREAN SECTION     x 3  . CESAREAN SECTION N/A 12/18/2013   Procedure: Repeat CESAREAN SECTION;  Surgeon: BernFrederico Hamman;  [facility];  Service: Obstetrics;  Laterality: N/A;  . CESAREAN SECTION N/A 01/13/2015   Procedure: REPEAT CESAREAN SECTION;  Surgeon: BernFrederico Hamman;  [facility];  Service: Obstetrics;  Laterality: N/A;     Current Outpatient Medications:  .  dicyclomine (BENTYL) 20 MG tablet, Take 1 tablet (20 mg total) by mouth 4 (four) times daily -  before meals and at bedtime. (Patient not taking: Reported on 08/13/2016), Disp: 12 tablet, Rfl: 0 .  fluconazole (DIFLUCAN) 150 MG tablet, Take 1 tablet (150 mg total) by mouth daily. Take after completing antibiotics (Patient not taking: Reported on 03/05/2018), Disp: 3 tablet, Rfl: 0 .  metroNIDAZOLE (FLAGYL) 500 MG tablet, Take 1 tablet (500 mg total) by mouth 2 (two) times daily. (Patient not taking: Reported on 03/05/2018), Disp:  14 tablet, Rfl: 0 .  miconazole (MONISTAT 1 COMBINATION PACK) kit, Place 1 each vaginally once., Disp: , Rfl:  .  naproxen (NAPROSYN) 500 MG tablet, Take 1 tablet (500 mg total) by mouth 2 (two) times daily with a meal. (Patient not taking: Reported on 08/13/2016), Disp: 30 tablet, Rfl: 0 .  nitrofurantoin, macrocrystal-monohydrate, (MACROBID) 100 MG capsule, Take 1 capsule (100 mg total) by mouth 2 (two) times daily., Disp: 14 capsule, Rfl: 0 .  Prenatal-DSS-FeCb-FeGl-FA (CITRANATAL BLOOM) 90-1 MG TABS, Take 1 tablet by mouth daily before breakfast., Disp: 30 tablet, Rfl: 11 No Known Allergies  Social History   Tobacco Use  . Smoking status: Never Smoker  . Smokeless tobacco: Never Used  Substance Use Topics  .  Alcohol use: No    Family History  Problem Relation Age of Onset  . Hypertension Mother       Review of Systems  Constitutional: negative for fatigue and weight loss Respiratory: negative for cough and wheezing Cardiovascular: negative for chest pain, fatigue and palpitations Gastrointestinal: negative for abdominal pain and change in bowel habits Musculoskeletal:negative for myalgias Neurological: negative for gait problems and tremors Behavioral/Psych: negative for abusive relationship, depression Endocrine: negative for temperature intolerance    Genitourinary:negative for abnormal menstrual periods, genital lesions, hot flashes, sexual problems and vaginal discharge Integument/breast: negative for breast lump, breast tenderness, nipple discharge and skin lesion(s)    Objective:       BP 111/76   Pulse 74   Ht _0  (1.575 m)   Wt 201 lb (91.2 kg)   LMP 01/09/2018 (Approximate)   BMI 36.76 kg/m  General:   alert  Skin:   no rash or abnormalities  Lungs:   clear to auscultation bilaterally  Heart:   regular rate and rhythm, S1, S2 normal, no murmur, click, rub or gallop  Breasts:   normal without suspicious masses, skin or nipple changes or axillary nodes  Abdomen:  normal findings: no organomegaly, soft, non-tender and no hernia  Pelvis:  External genitalia: normal general appearance Urinary system: urethral meatus normal and bladder without fullness, nontender Vaginal: normal without tenderness, induration or masses Cervix: normal appearance Adnexa: normal bimanual exam Uterus: anteverted and non-tender, normal size   Lab Review Urine pregnancy test Labs reviewed yes Radiologic studies reviewed no  50% of 20 min visit spent on counseling and coordination of care.   Assessment:     1. Encounter for routine gynecological examination with Papanicolaou smear of cervix Rx: - Cytology - PAP  2. Missed period Rx: - POCT urine pregnancy  3. Urinary  frequency Rx: - Urine Culture - nitrofurantoin, macrocrystal-monohydrate, (MACROBID) 100 MG capsule; Take 1 capsule (100 mg total) by mouth 2 (two) times daily.  Dispense: 14 capsule; Refill: 0  4. Iron deficiency anemia secondary to inadequate dietary iron intake Rx: - Prenatal-DSS-FeCb-FeGl-FA (CITRANATAL BLOOM) 90-1 MG TABS; Take 1 tablet by mouth daily before breakfast.  Dispense: 30 tablet; Refill: 11  5. Obesity (BMI 35.0-39.9 without comorbidity) - program that includes caloric reduction, exercise and behavioral modification recommended    Plan:    Education reviewed: calcium supplements, depression evaluation, low fat, low cholesterol diet, safe sex/STD prevention, self breast exams and weight bearing exercise. Contraception: none. Follow up in: 1 year.   Meds ordered this encounter  Medications  . nitrofurantoin, macrocrystal-monohydrate, (MACROBID) 100 MG capsule    Sig: Take 1 capsule (100 mg total) by mouth 2 (two) times daily.    Dispense:  14 capsule  Refill:  0  . Prenatal-DSS-FeCb-FeGl-FA (CITRANATAL BLOOM) 90-1 MG TABS    Sig: Take 1 tablet by mouth daily before breakfast.    Dispense:  30 tablet    Refill:  11   Orders Placed This Encounter  Procedures  . Urine Culture  . POCT urine pregnancy    Shelly Bombard MD 03-05-2018

## 2018-03-07 LAB — CYTOLOGY - PAP
Adequacy: ABSENT
Diagnosis: NEGATIVE
HPV: NOT DETECTED

## 2018-03-08 ENCOUNTER — Other Ambulatory Visit: Payer: Self-pay | Admitting: Obstetrics

## 2018-03-08 DIAGNOSIS — A499 Bacterial infection, unspecified: Secondary | ICD-10-CM

## 2018-03-08 DIAGNOSIS — N390 Urinary tract infection, site not specified: Principal | ICD-10-CM

## 2018-03-08 LAB — URINE CULTURE

## 2018-03-08 MED ORDER — AMOXICILLIN-POT CLAVULANATE 875-125 MG PO TABS: 1.0000 | ORAL_TABLET | Freq: Two times a day (BID) | ORAL | 0 refills | Status: DC

## 2018-03-09 NOTE — Progress Notes (Signed)
Attempted to call pt. Unable to reach, busy signal

## 2018-07-24 ENCOUNTER — Ambulatory Visit: Payer: Medicaid Other | Admitting: Obstetrics

## 2018-07-25 ENCOUNTER — Ambulatory Visit: Payer: Medicaid Other | Admitting: Obstetrics

## 2018-07-25 ENCOUNTER — Encounter: Payer: Self-pay | Admitting: Obstetrics

## 2018-07-25 VITALS — BP 128/81 | HR 89 | Wt 204.0 lb

## 2018-07-25 DIAGNOSIS — N3001 Acute cystitis with hematuria: Secondary | ICD-10-CM | POA: Diagnosis not present

## 2018-07-25 DIAGNOSIS — R300 Dysuria: Secondary | ICD-10-CM

## 2018-07-25 LAB — POCT URINALYSIS DIPSTICK
Bilirubin, UA: NEGATIVE
Blood, UA: POSITIVE
Glucose, UA: NEGATIVE
Nitrite, UA: NEGATIVE
Protein, UA: NEGATIVE
Spec Grav, UA: 1.01
Urobilinogen, UA: 0.2 U/dL
pH, UA: 7

## 2018-07-25 MED ORDER — NITROFURANTOIN MONOHYD MACRO 100 MG PO CAPS: 100.0000 mg | ORAL_CAPSULE | Freq: Two times a day (BID) | ORAL | 2 refills | Status: DC

## 2018-07-25 NOTE — Progress Notes (Addendum)
Patient ID: Mia Wilkinson, female   DOB: [birth year], 32 y.o.   MRN: [id number]  Chief Complaint  Patient presents with  . Dysuria    HPI Mia Wilkinson is a 32 y.o. female.  BACK PAIN AT THE END OF URINATION HPI  Past Medical History:  Diagnosis Date  . Anemia   . GERD (gastroesophageal reflux disease)    with pregnancy    Past Surgical History:  Procedure Laterality Date  . CESAREAN SECTION     x 3  . CESAREAN SECTION N/A 12/18/2013   Procedure: Repeat CESAREAN SECTION;  Surgeon: Frederico Hamman, MD;  Location: [facility];  Service: Obstetrics;  Laterality: N/A;  . CESAREAN SECTION N/A 01/13/2015   Procedure: REPEAT CESAREAN SECTION;  Surgeon: Frederico Hamman, MD;  Location: [facility];  Service: Obstetrics;  Laterality: N/A;    Family History  Problem Relation Age of Onset  . Hypertension Mother     Social History Social History   Tobacco Use  . Smoking status: Never Smoker  . Smokeless tobacco: Never Used  Substance Use Topics  . Alcohol use: No  . Drug use: No    No Known Allergies  Current Outpatient Medications  Medication Sig Dispense Refill  . amoxicillin-clavulanate (AUGMENTIN) 875-125 MG tablet Take 1 tablet by mouth 2 (two) times daily. (Patient not taking: Reported on 07/25/2018) 14 tablet 0  . dicyclomine (BENTYL) 20 MG tablet Take 1 tablet (20 mg total) by mouth 4 (four) times daily -  before meals and at bedtime. (Patient not taking: Reported on 08/13/2016) 12 tablet 0  . fluconazole (DIFLUCAN) 150 MG tablet Take 1 tablet (150 mg total) by mouth daily. Take after completing antibiotics (Patient not taking: Reported on 03/05/2018) 3 tablet 0  . metroNIDAZOLE (FLAGYL) 500 MG tablet Take 1 tablet (500 mg total) by mouth 2 (two) times daily. (Patient not taking: Reported on 03/05/2018) 14 tablet 0  . miconazole (MONISTAT 1 COMBINATION PACK) kit Place 1 each vaginally once.    . naproxen (NAPROSYN) 500 MG tablet Take 1 tablet (500 mg total) by mouth 2 (two)  times daily with a meal. (Patient not taking: Reported on 08/13/2016) 30 tablet 0  . nitrofurantoin, macrocrystal-monohydrate, (MACROBID) 100 MG capsule Take 1 capsule (100 mg total) by mouth 2 (two) times daily. (Patient not taking: Reported on 07/25/2018) 14 capsule 0  . nitrofurantoin, macrocrystal-monohydrate, (MACROBID) 100 MG capsule Take 1 capsule (100 mg total) by mouth 2 (two) times daily. 14 capsule 2  . Prenatal-DSS-FeCb-FeGl-FA (CITRANATAL BLOOM) 90-1 MG TABS Take 1 tablet by mouth daily before breakfast. (Patient not taking: Reported on 07/25/2018) 30 tablet 11   No current facility-administered medications for this visit.     Review of Systems Review of Systems Constitutional: negative for fatigue and weight loss Respiratory: negative for cough and wheezing Cardiovascular: negative for chest pain, fatigue and palpitations Gastrointestinal: negative for abdominal pain and change in bowel habits Genitourinary:POSITIVE for back pain at end of urination Integument/breast: negative for nipple discharge Musculoskeletal:negative for myalgias Neurological: negative for gait problems and tremors Behavioral/Psych: negative for abusive relationship, depression Endocrine: negative for temperature intolerance      Blood pressure 128/81, pulse 89, weight 204 lb (92.5 kg), unknown if currently breastfeeding.  Physical Exam Physical Exam:  DEFERRED  >% of 15 min visit spent on counseling and coordination of care.   Data Reviewed Urinalysis  Assessment     1. Dysuria Rx: - POCT Urinalysis Dipstick - Urine Culture  2.  Acute cystitis with hematuria Rx: - nitrofurantoin, macrocrystal-monohydrate, (MACROBID) 100 MG capsule; Take 1 capsule (100 mg total) by mouth 2 (two) times daily.  Dispense: 14 capsule; Refill: 2    Plan    F/U in 8 months for Annual or prn   Orders Placed This Encounter  Procedures  . Urine Culture  . POCT Urinalysis Dipstick   Meds ordered this  encounter  Medications  . nitrofurantoin, macrocrystal-monohydrate, (MACROBID) 100 MG capsule    Sig: Take 1 capsule (100 mg total) by mouth 2 (two) times daily.    Dispense:  14 capsule    Refill:  2    Shelly Bombard MD 07-25-2018

## 2018-07-25 NOTE — Progress Notes (Signed)
Pt is here with c/o burning and odor when urinating for about 3 days.

## 2018-07-28 LAB — URINE CULTURE

## 2018-07-29 ENCOUNTER — Other Ambulatory Visit: Payer: Self-pay | Admitting: Obstetrics

## 2018-08-01 ENCOUNTER — Other Ambulatory Visit: Payer: Self-pay

## 2018-08-01 NOTE — Progress Notes (Signed)
error 

## 2018-08-20 ENCOUNTER — Ambulatory Visit (INDEPENDENT_AMBULATORY_CARE_PROVIDER_SITE_OTHER): Payer: Self-pay | Admitting: Obstetrics

## 2018-08-20 ENCOUNTER — Encounter: Payer: Self-pay | Admitting: Obstetrics

## 2018-08-20 VITALS — BP 113/73 | HR 69 | Wt 202.9 lb

## 2018-08-20 DIAGNOSIS — N898 Other specified noninflammatory disorders of vagina: Secondary | ICD-10-CM

## 2018-08-20 DIAGNOSIS — Z113 Encounter for screening for infections with a predominantly sexual mode of transmission: Secondary | ICD-10-CM

## 2018-08-20 DIAGNOSIS — B9689 Other specified bacterial agents as the cause of diseases classified elsewhere: Secondary | ICD-10-CM

## 2018-08-20 DIAGNOSIS — Z87440 Personal history of urinary (tract) infections: Secondary | ICD-10-CM

## 2018-08-20 DIAGNOSIS — N760 Acute vaginitis: Secondary | ICD-10-CM

## 2018-08-20 MED ORDER — FLUCONAZOLE 200 MG PO TABS: 200.0000 mg | ORAL_TABLET | ORAL | 2 refills | Status: DC

## 2018-08-20 NOTE — Progress Notes (Signed)
Patient ID: Mia Wilkinson, female   DOB: 11-07-1985, 32 y.o.   MRN: [id number]  Chief Complaint  Patient presents with  . Vaginal Itching    HPI Mia Wilkinson is a 32 y.o. female.  Vaginal itching and discharge after taking antibiotic for UTI. HPI  Past Medical History:  Diagnosis Date  . Anemia   . GERD (gastroesophageal reflux disease)    with pregnancy    Past Surgical History:  Procedure Laterality Date  . CESAREAN SECTION     x 3  . CESAREAN SECTION N/A 12/18/2013   Procedure: Repeat CESAREAN SECTION;  Surgeon: Frederico Hamman, MD;  Location: [facility];  Service: Obstetrics;  Laterality: N/A;  . CESAREAN SECTION N/A 01/13/2015   Procedure: REPEAT CESAREAN SECTION;  Surgeon: Frederico Hamman, MD;  Location: [facility];  Service: Obstetrics;  Laterality: N/A;    Family History  Problem Relation Age of Onset  . Hypertension Mother     Social History Social History   Tobacco Use  . Smoking status: Never Smoker  . Smokeless tobacco: Never Used  Substance Use Topics  . Alcohol use: No  . Drug use: No    No Known Allergies  Current Outpatient Medications  Medication Sig Dispense Refill  . amoxicillin-clavulanate (AUGMENTIN) 875-125 MG tablet Take 1 tablet by mouth 2 (two) times daily. (Patient not taking: Reported on 07/25/2018) 14 tablet 0  . dicyclomine (BENTYL) 20 MG tablet Take 1 tablet (20 mg total) by mouth 4 (four) times daily -  before meals and at bedtime. (Patient not taking: Reported on 08/13/2016) 12 tablet 0  . fluconazole (DIFLUCAN) 150 MG tablet Take 1 tablet (150 mg total) by mouth daily. Take after completing antibiotics (Patient not taking: Reported on 03/05/2018) 3 tablet 0  . fluconazole (DIFLUCAN) 200 MG tablet Take 1 tablet (200 mg total) by mouth every 3 (three) days. 3 tablet 2  . metroNIDAZOLE (FLAGYL) 500 MG tablet Take 1 tablet (500 mg total) by mouth 2 (two) times daily. (Patient not taking: Reported on 03/05/2018) 14 tablet 0  . miconazole  (MONISTAT 1 COMBINATION PACK) kit Place 1 each vaginally once.    . naproxen (NAPROSYN) 500 MG tablet Take 1 tablet (500 mg total) by mouth 2 (two) times daily with a meal. (Patient not taking: Reported on 08/13/2016) 30 tablet 0  . nitrofurantoin, macrocrystal-monohydrate, (MACROBID) 100 MG capsule Take 1 capsule (100 mg total) by mouth 2 (two) times daily. (Patient not taking: Reported on 07/25/2018) 14 capsule 0  . nitrofurantoin, macrocrystal-monohydrate, (MACROBID) 100 MG capsule Take 1 capsule (100 mg total) by mouth 2 (two) times daily. 14 capsule 2  . Prenatal-DSS-FeCb-FeGl-FA (CITRANATAL BLOOM) 90-1 MG TABS Take 1 tablet by mouth daily before breakfast. (Patient not taking: Reported on 07/25/2018) 30 tablet 11   No current facility-administered medications for this visit.     Review of Systems Review of Systems Constitutional: negative for fatigue and weight loss Respiratory: negative for cough and wheezing Cardiovascular: negative for chest pain, fatigue and palpitations Gastrointestinal: negative for abdominal pain and change in bowel habits Genitourinary:positive for vaginal discharge and itching Integument/breast: negative for nipple discharge Musculoskeletal:negative for myalgias Neurological: negative for gait problems and tremors Behavioral/Psych: negative for abusive relationship, depression Endocrine: negative for temperature intolerance      unknown if currently breastfeeding.  Physical Exam Physical Exam:  Deferred  >50% of 10 min visit spent on counseling and coordination of care.   Data Reviewed Urinalysis  Assessment  1. Vaginal irritation Rx: - Urine cytology ancillary only([facility]) - fluconazole (DIFLUCAN) 200 MG tablet; Take 1 tablet (200 mg total) by mouth every 3 (three) days.  Dispense: 3 tablet; Refill: 2  2. History of UTI Rx: - Urine Culture   Plan    Follow up prn  Orders Placed This Encounter  Procedures  . Urine Culture    Meds ordered this encounter  Medications  . fluconazole (DIFLUCAN) 200 MG tablet    Sig: Take 1 tablet (200 mg total) by mouth every 3 (three) days.    Dispense:  3 tablet    Refill:  2    Shelly Bombard MD 08-20-2018

## 2018-08-20 NOTE — Progress Notes (Signed)
GYN C/o having a discharge for 1 week, she took Monistat on Saturday and it helped a little, then her period started.  She has a lot of itching x 5 days.  She said the Anbx given fot the UTI did not help.

## 2018-08-21 LAB — URINE CYTOLOGY ANCILLARY ONLY
Chlamydia: NEGATIVE
Neisseria Gonorrhea: NEGATIVE
Trichomonas: NEGATIVE

## 2018-08-22 LAB — URINE CULTURE

## 2018-08-23 ENCOUNTER — Other Ambulatory Visit: Payer: Self-pay | Admitting: Obstetrics

## 2018-08-23 DIAGNOSIS — N760 Acute vaginitis: Principal | ICD-10-CM

## 2018-08-23 DIAGNOSIS — B9689 Other specified bacterial agents as the cause of diseases classified elsewhere: Secondary | ICD-10-CM

## 2018-08-23 DIAGNOSIS — N3001 Acute cystitis with hematuria: Secondary | ICD-10-CM

## 2018-08-23 LAB — URINE CYTOLOGY ANCILLARY ONLY
Bacterial vaginitis: POSITIVE — AB
CANDIDA VAGINITIS: NEGATIVE

## 2018-08-23 MED ORDER — NITROFURANTOIN MONOHYD MACRO 100 MG PO CAPS: 100.0000 mg | ORAL_CAPSULE | Freq: Two times a day (BID) | ORAL | 2 refills | Status: DC

## 2018-08-23 MED ORDER — TINIDAZOLE 500 MG PO TABS: 1000.0000 mg | ORAL_TABLET | Freq: Every day | ORAL | 2 refills | Status: DC

## 2018-08-24 ENCOUNTER — Other Ambulatory Visit: Payer: Self-pay

## 2018-08-24 DIAGNOSIS — N760 Acute vaginitis: Principal | ICD-10-CM

## 2018-08-24 DIAGNOSIS — B9689 Other specified bacterial agents as the cause of diseases classified elsewhere: Secondary | ICD-10-CM

## 2018-08-24 MED ORDER — METRONIDAZOLE 0.75 % VA GEL: 1.0000 | Freq: Every day | VAGINAL | 1 refills | Status: DC

## 2018-08-30 ENCOUNTER — Telehealth: Payer: Self-pay

## 2018-08-30 NOTE — Telephone Encounter (Signed)
Unable to leave message, phone rang busy.   Patient is Not covered for December, she can get coupon from [email or web address] to cover Tinidazole.   Patient returned call, she has been notified.

## 2019-04-10 ENCOUNTER — Telehealth: Payer: Self-pay | Admitting: General Practice

## 2019-04-10 NOTE — Telephone Encounter (Signed)
Pt states office called her and no vm/details were left  No telephone encounter entered

## 2019-04-16 ENCOUNTER — Ambulatory Visit (INDEPENDENT_AMBULATORY_CARE_PROVIDER_SITE_OTHER): Payer: Self-pay | Admitting: Registered Nurse

## 2019-04-16 ENCOUNTER — Encounter: Payer: Self-pay | Admitting: Registered Nurse

## 2019-04-16 ENCOUNTER — Other Ambulatory Visit: Payer: Self-pay

## 2019-04-16 VITALS — BP 106/73 | HR 97 | Temp 98.0°F | Resp 16 | Ht 62.21 in | Wt 200.0 lb

## 2019-04-16 DIAGNOSIS — Z7689 Persons encountering health services in other specified circumstances: Secondary | ICD-10-CM

## 2019-04-16 DIAGNOSIS — Z01818 Encounter for other preprocedural examination: Secondary | ICD-10-CM

## 2019-04-16 NOTE — Progress Notes (Signed)
New Patient Office Visit  Subjective:  Patient ID: Mia Wilkinson, female    DOB: August 14, 1986  Age: 33 y.o. MRN: [id number]  CC:  Chief Complaint  Patient presents with  . Sugical Clearance    HPI [name] presents for visit to establish care and pre-op exam  She presents with paperwork that details the lab work needed. She has her surgery scheduled for 04/30/19, and states that she needs lab work and EKG completed 10 days prior to the surgery. Additionally, she states that she is hoping to lose around 5 more pounds before the surgery.   No complaints today. Denies any significant medical history. States her only surgical history is C-section and tooth extraction.  Past Medical History:  Diagnosis Date  . Anemia   . GERD (gastroesophageal reflux disease)    with pregnancy    Past Surgical History:  Procedure Laterality Date  . CESAREAN SECTION     x 3  . CESAREAN SECTION N/A 12/18/2013   Procedure: Repeat CESAREAN SECTION;  Surgeon: Frederico Hamman, MD;  Location: [facility];  Service: Obstetrics;  Laterality: N/A;  . CESAREAN SECTION N/A 01/13/2015   Procedure: REPEAT CESAREAN SECTION;  Surgeon: Frederico Hamman, MD;  Location: [facility];  Service: Obstetrics;  Laterality: N/A;    Family History  Problem Relation Age of Onset  . Hypertension Mother     Social History   Socioeconomic History  . Marital status: Single    Spouse name: Not on file  . Number of children: Not on file  . Years of education: Not on file  . Highest education level: Not on file  Occupational History  . Not on file  Social Needs  . Financial resource strain: Not on file  . Food insecurity    Worry: Not on file    Inability: Not on file  . Transportation needs    Medical: Not on file    Non-medical: Not on file  Tobacco Use  . Smoking status: Never Smoker  . Smokeless tobacco: Never Used  Substance and Sexual Activity  . Alcohol use: No  . Drug use: No  . Sexual activity: Yes     Birth control/protection: None  Lifestyle  . Physical activity    Days per week: Not on file    Minutes per session: Not on file  . Stress: Not on file  Relationships  . Social Herbalist on phone: Not on file    Gets together: Not on file    Attends religious service: Not on file    Active member of club or organization: Not on file    Attends meetings of clubs or organizations: Not on file    Relationship status: Not on file  . Intimate partner violence    Fear of current or ex partner: Not on file    Emotionally abused: Not on file    Physically abused: Not on file    Forced sexual activity: Not on file  Other Topics Concern  . Not on file  Social History Narrative  . Not on file    ROS Review of Systems  Constitutional: Negative.   Eyes: Negative.   Respiratory: Negative.   Cardiovascular: Negative.   Gastrointestinal: Negative.   Endocrine: Negative.   Genitourinary: Negative.   Musculoskeletal: Negative.   Skin: Negative.   Allergic/Immunologic: Negative.   Neurological: Negative.   Hematological: Negative.   Psychiatric/Behavioral: Negative.   All other systems reviewed and  are negative.   Objective:   Today's Vitals: BP 106/73   Pulse 97   Temp 98 F (36.7 C) (Oral)   Resp 16   Ht 5' 2.21" (1.58 m)   Wt 200 lb (90.7 kg)   LMP 04/10/2019 (Approximate)   SpO2 98%   BMI 36.34 kg/m   Physical Exam Vitals signs and nursing note reviewed.  Constitutional:      General: She is not in acute distress.    Appearance: Normal appearance. She is not ill-appearing, toxic-appearing or diaphoretic.  HENT:     Head: Atraumatic.     Right Ear: Tympanic membrane, ear canal and external ear normal. There is no impacted cerumen.     Left Ear: Tympanic membrane, ear canal and external ear normal. There is no impacted cerumen.     Nose: No congestion or rhinorrhea.     Mouth/Throat:     Mouth: Mucous membranes are moist.     Pharynx: Oropharynx is  clear. No oropharyngeal exudate.  Eyes:     General: No scleral icterus.       Right eye: No discharge.        Left eye: No discharge.     Extraocular Movements: Extraocular movements intact.     Conjunctiva/sclera: Conjunctivae normal.     Pupils: Pupils are equal, round, and reactive to light.  Neck:     Musculoskeletal: Normal range of motion and neck supple. No neck rigidity or muscular tenderness.  Cardiovascular:     Rate and Rhythm: Normal rate and regular rhythm.     Pulses: Normal pulses.     Heart sounds: Normal heart sounds. No murmur. No friction rub. No gallop.   Pulmonary:     Effort: Pulmonary effort is normal. No respiratory distress.     Breath sounds: Normal breath sounds. No stridor. No wheezing, rhonchi or rales.  Chest:     Chest wall: No tenderness.  Abdominal:     General: Bowel sounds are normal. There is no distension.     Palpations: Abdomen is soft. There is no mass.     Tenderness: There is no abdominal tenderness. There is no right CVA tenderness, left CVA tenderness, guarding or rebound.     Hernia: No hernia is present.  Musculoskeletal: Normal range of motion.        General: No swelling or deformity.     Right lower leg: No edema.     Left lower leg: No edema.  Lymphadenopathy:     Cervical: No cervical adenopathy.  Skin:    General: Skin is warm and dry.     Capillary Refill: Pt wearing nail polish, unable to assess    Coloration: Skin is not jaundiced or pale.     Findings: No bruising, erythema, lesion or rash.  Neurological:     General: No focal deficit present.     Mental Status: She is alert and oriented to person, place, and time. Mental status is at baseline.     Cranial Nerves: No cranial nerve deficit.     Sensory: No sensory deficit.     Motor: No weakness.     Coordination: Coordination normal.     Gait: Gait normal.     Deep Tendon Reflexes: Reflexes normal.  Psychiatric:        Mood and Affect: Mood normal.        Behavior:  Behavior normal.        Thought Content: Thought content normal.  Judgment: Judgment normal.     Assessment & Plan:   Problem List Items Addressed This Visit    None    Visit Diagnoses    Encounter to establish care    -  Primary   Pre-op exam       Relevant Orders   Comprehensive metabolic panel   CBC with Differential/Platelet   CP4508-PT/INR AND PTT   Protime-INR   APTT   HIV Antibody (routine testing w rflx)   Beta hCG quant (ref lab)   Hemoglobin A1c   Thyroid Panel With TSH   EKG 12-Lead      Outpatient Encounter Medications as of 04/16/2019  Medication Sig  . Prenatal-DSS-FeCb-FeGl-FA (CITRANATAL BLOOM) 90-1 MG TABS Take 1 tablet by mouth daily before breakfast.  . [DISCONTINUED] amoxicillin-clavulanate (AUGMENTIN) 875-125 MG tablet Take 1 tablet by mouth 2 (two) times daily.  . [DISCONTINUED] dicyclomine (BENTYL) 20 MG tablet Take 1 tablet (20 mg total) by mouth 4 (four) times daily -  before meals and at bedtime.  . [DISCONTINUED] fluconazole (DIFLUCAN) 150 MG tablet Take 1 tablet (150 mg total) by mouth daily. Take after completing antibiotics  . [DISCONTINUED] fluconazole (DIFLUCAN) 200 MG tablet Take 1 tablet (200 mg total) by mouth every 3 (three) days.  . [DISCONTINUED] metroNIDAZOLE (FLAGYL) 500 MG tablet Take 1 tablet (500 mg total) by mouth 2 (two) times daily.  . [DISCONTINUED] metroNIDAZOLE (METROGEL) 0.75 % vaginal gel Place 1 Applicatorful vaginally at bedtime. Apply one applicatorful to vagina at bedtime for 5 days  . [DISCONTINUED] miconazole (MONISTAT 1 COMBINATION PACK) kit Place 1 each vaginally once.  . [DISCONTINUED] naproxen (NAPROSYN) 500 MG tablet Take 1 tablet (500 mg total) by mouth 2 (two) times daily with a meal.  . [DISCONTINUED] nitrofurantoin, macrocrystal-monohydrate, (MACROBID) 100 MG capsule Take 1 capsule (100 mg total) by mouth 2 (two) times daily.  . [DISCONTINUED] nitrofurantoin, macrocrystal-monohydrate, (MACROBID) 100 MG  capsule Take 1 capsule (100 mg total) by mouth 2 (two) times daily.  . [DISCONTINUED] tinidazole (TINDAMAX) 500 MG tablet Take 2 tablets (1,000 mg total) by mouth daily with breakfast.   No facility-administered encounter medications on file as of 04/16/2019.     Follow-up: No follow-ups on file.   PLAN  Normal findings on exam.  Labs drawn, will follow up with any pertinent results, otherwise, patient will present tomorrow afternoon for a printed copy of labs. In addition, we have been given the fax information for the surgical center, and we will fax results there when they are available.  Normal EKG in office today  Overall, patient fit for surgery at this time from a medical perspective.   Patient encouraged to call clinic with any questions, comments, or concerns.   Maximiano Coss, NP

## 2019-04-16 NOTE — Patient Instructions (Signed)
° ° ° °  If you have lab work done today you will be contacted with your lab results within the next 2 weeks.  If you have not heard from us then please contact us. The fastest way to get your results is to register for My Chart. ° ° °IF you received an x-ray today, you will receive an invoice from [facility]. Please contact [facility] at [phone] with questions or concerns regarding your invoice.  ° °IF you received labwork today, you will receive an invoice from [facility]. Please contact [facility] at [phone] with questions or concerns regarding your invoice.  ° °Our billing staff will not be able to assist you with questions regarding bills from these companies. ° °You will be contacted with the lab results as soon as they are available. The fastest way to get your results is to activate your My Chart account. Instructions are located on the last page of this paperwork. If you have not heard from us regarding the results in 2 weeks, please contact this office. °  ° ° ° °

## 2019-04-17 ENCOUNTER — Telehealth: Payer: Self-pay

## 2019-04-17 LAB — BETA HCG QUANT (REF LAB): hCG Quant: <1 m[IU]/mL

## 2019-04-17 LAB — CBC WITH DIFFERENTIAL/PLATELET
Basophils Absolute: 0 10*3/uL (ref 0.0–0.2)
Basos: 1 %
EOS (ABSOLUTE): 0.1 10*3/uL (ref 0.0–0.4)
Eos: 2 %
Hematocrit: 39.9 % (ref 34.0–46.6)
Hemoglobin: 12.3 g/dL (ref 11.1–15.9)
Immature Grans (Abs): 0 10*3/uL (ref 0.0–0.1)
Immature Granulocytes: 0 %
Lymphocytes Absolute: 1.5 10*3/uL (ref 0.7–3.1)
Lymphs: 28 %
MCH: 24.6 pg — ABNORMAL LOW (ref 26.6–33.0)
MCHC: 30.8 g/dL — ABNORMAL LOW (ref 31.5–35.7)
MCV: 80 fL (ref 79–97)
Monocytes Absolute: 0.3 10*3/uL (ref 0.1–0.9)
Monocytes: 6 %
Neutrophils Absolute: 3.4 10*3/uL (ref 1.4–7.0)
Neutrophils: 63 %
Platelets: 221 10*3/uL (ref 150–450)
RBC: 4.99 x10E6/uL (ref 3.77–5.28)
RDW: 14.5 % (ref 11.7–15.4)
WBC: 5.3 10*3/uL (ref 3.4–10.8)

## 2019-04-17 LAB — COMPREHENSIVE METABOLIC PANEL
ALT: 18 IU/L (ref 0–32)
AST: 16 IU/L (ref 0–40)
Albumin/Globulin Ratio: 1.1 — ABNORMAL LOW (ref 1.2–2.2)
Albumin: 4 g/dL (ref 3.8–4.8)
Alkaline Phosphatase: 81 IU/L (ref 39–117)
BUN/Creatinine Ratio: 10 (ref 9–23)
BUN: 8 mg/dL (ref 6–20)
Bilirubin Total: <0.2 mg/dL (ref 0.0–1.2)
CO2: 23 mmol/L (ref 20–29)
Calcium: 9.2 mg/dL (ref 8.7–10.2)
Chloride: 102 mmol/L (ref 96–106)
Creatinine, Ser: 0.77 mg/dL (ref 0.57–1.00)
GFR calc Af Amer: 117 mL/min/{1.73_m2} (ref >59)
GFR calc non Af Amer: 102 mL/min/{1.73_m2} (ref >59)
Globulin, Total: 3.8 g/dL (ref 1.5–4.5)
Glucose: 92 mg/dL (ref 65–99)
Potassium: 4 mmol/L (ref 3.5–5.2)
Sodium: 140 mmol/L (ref 134–144)
Total Protein: 7.8 g/dL (ref 6.0–8.5)

## 2019-04-17 LAB — PROTIME-INR
INR: 1 (ref 0.8–1.2)
Prothrombin Time: 10.9 s (ref 9.1–12.0)

## 2019-04-17 LAB — THYROID PANEL WITH TSH
Free Thyroxine Index: 2.9 (ref 1.2–4.9)
T3 Uptake Ratio: 33 % (ref 24–39)
T4, Total: 8.8 ug/dL (ref 4.5–12.0)
TSH: 1.64 u[IU]/mL (ref 0.450–4.500)

## 2019-04-17 LAB — APTT: aPTT: 31 s (ref 24–33)

## 2019-04-17 LAB — HEMOGLOBIN A1C
Est. average glucose Bld gHb Est-mCnc: 114 mg/dL
Hgb A1c MFr Bld: 5.6 % (ref 4.8–5.6)

## 2019-04-17 LAB — HIV ANTIBODY (ROUTINE TESTING W REFLEX): HIV Screen 4th Generation wRfx: NONREACTIVE

## 2019-04-17 NOTE — Telephone Encounter (Signed)
Called pt and informed her that labs has been faxed to [facility] [phone] today with confirmation received. She verbalized understanding and will come by the office to pick up copy.

## 2019-04-17 NOTE — Progress Notes (Signed)
[facility] lab results are back. If you could print them to be faxed and let Ms Dorow know that they are ready to be picked up, that would be excellent. Overall, there are some minor deviations from the norm that I don't believe will hold her back from surgery, though ultimately, it's the surgeon's discretion.  If she has any questions, let me know, and I can give her a call.  Thank you,  Kathrin Ruddy, NP

## 2019-04-24 ENCOUNTER — Ambulatory Visit: Payer: Self-pay | Admitting: Registered Nurse

## 2019-04-24 ENCOUNTER — Other Ambulatory Visit: Payer: Self-pay

## 2019-04-24 DIAGNOSIS — Z7689 Persons encountering health services in other specified circumstances: Secondary | ICD-10-CM

## 2019-04-24 LAB — CBC WITH DIFFERENTIAL/PLATELET
Basophils Absolute: 0 10*3/uL (ref 0.0–0.2)
Basos: 1 %
EOS (ABSOLUTE): 0.1 10*3/uL (ref 0.0–0.4)
Eos: 2 %
Hematocrit: 41.1 % (ref 34.0–46.6)
Hemoglobin: 13.1 g/dL (ref 11.1–15.9)
Immature Grans (Abs): 0 10*3/uL (ref 0.0–0.1)
Immature Granulocytes: 0 %
Lymphocytes Absolute: 1.6 10*3/uL (ref 0.7–3.1)
Lymphs: 25 %
MCH: 25.4 pg — ABNORMAL LOW (ref 26.6–33.0)
MCHC: 31.9 g/dL (ref 31.5–35.7)
MCV: 80 fL (ref 79–97)
Monocytes Absolute: 0.4 10*3/uL (ref 0.1–0.9)
Monocytes: 6 %
Neutrophils Absolute: 4.2 10*3/uL (ref 1.4–7.0)
Neutrophils: 66 %
Platelets: 209 10*3/uL (ref 150–450)
RBC: 5.15 x10E6/uL (ref 3.77–5.28)
RDW: 14.5 % (ref 11.7–15.4)
WBC: 6.4 10*3/uL (ref 3.4–10.8)

## 2019-05-02 HISTORY — PX: LIPOSUCTION: SHX10

## 2019-05-08 ENCOUNTER — Ambulatory Visit (INDEPENDENT_AMBULATORY_CARE_PROVIDER_SITE_OTHER): Payer: Self-pay | Admitting: Family Medicine

## 2019-05-08 ENCOUNTER — Encounter: Payer: Self-pay | Admitting: Family Medicine

## 2019-05-08 ENCOUNTER — Other Ambulatory Visit: Payer: Self-pay

## 2019-05-08 VITALS — BP 118/85 | HR 98 | Temp 98.7°F | Resp 16 | Wt 197.2 lb

## 2019-05-08 DIAGNOSIS — Z9889 Other specified postprocedural states: Secondary | ICD-10-CM

## 2019-05-08 NOTE — Patient Instructions (Signed)
° ° ° °  If you have lab work done today you will be contacted with your lab results within the next 2 weeks.  If you have not heard from us then please contact us. The fastest way to get your results is to register for My Chart. ° ° °IF you received an x-ray today, you will receive an invoice from [facility]. Please contact [facility] at [phone] with questions or concerns regarding your invoice.  ° °IF you received labwork today, you will receive an invoice from [facility]. Please contact [facility] at [phone] with questions or concerns regarding your invoice.  ° °Our billing staff will not be able to assist you with questions regarding bills from these companies. ° °You will be contacted with the lab results as soon as they are available. The fastest way to get your results is to activate your My Chart account. Instructions are located on the last page of this paperwork. If you have not heard from us regarding the results in 2 weeks, please contact this office. °  ° ° ° °

## 2019-05-08 NOTE — Progress Notes (Signed)
Subjective:    Patient ID: Mia Wilkinson, female    DOB: Sep 17, 1985, 34 y.o.   MRN: [id number]  HPI Mia Wilkinson is a 33 y.o. female Presents today for: Chief Complaint  Patient presents with  . Open Wound    Here today to get drain taking out.   Had liposuction performed in [place] on 8/25.  Psychiatric nurse. Drain was placed, but had to leave before it could be removed. Supposed to be removed 2 days ago. Called here and was told it would be ok to be removed here.   Still some fluid draining into drain, but no blood. Pulling in stomach.   Offered to have her seen by general surgery locally or plastic surgery.   Patient Active Problem List   Diagnosis Date Noted  . S/P cesarean section 12/18/2013   Past Medical History:  Diagnosis Date  . Anemia   . GERD (gastroesophageal reflux disease)    with pregnancy   Past Surgical History:  Procedure Laterality Date  . CESAREAN SECTION     x 3  . CESAREAN SECTION N/A 12/18/2013   Procedure: Repeat CESAREAN SECTION;  Surgeon: Frederico Hamman, MD;  Location: [facility];  Service: Obstetrics;  Laterality: N/A;  . CESAREAN SECTION N/A 01/13/2015   Procedure: REPEAT CESAREAN SECTION;  Surgeon: Frederico Hamman, MD;  Location: [facility];  Service: Obstetrics;  Laterality: N/A;   No Known Allergies Prior to Admission medications   Medication Sig Start Date End Date Taking? Authorizing Provider  Ascorbic Acid (VITAMIN C) 100 MG tablet Take 100 mg by mouth daily.   Yes [provider]  Multiple Vitamins-Minerals (ONE-A-DAY VITACRAVES ADULT) CHEW Chew by mouth.   Yes [provider]  Prenatal-DSS-FeCb-FeGl-FA Guinevere Scarlet BLOOM) 90-1 MG TABS Take 1 tablet by mouth daily before breakfast. 03/05/18  Yes Shelly Bombard, MD   Social History   Socioeconomic History  . Marital status: Single    Spouse name: Not on file  . Number of children: Not on file  . Years of education: Not on file  . Highest education level: Not on  file  Occupational History  . Not on file  Social Needs  . Financial resource strain: Not on file  . Food insecurity    Worry: Not on file    Inability: Not on file  . Transportation needs    Medical: Not on file    Non-medical: Not on file  Tobacco Use  . Smoking status: Never Smoker  . Smokeless tobacco: Never Used  Substance and Sexual Activity  . Alcohol use: No  . Drug use: No  . Sexual activity: Yes    Birth control/protection: None  Lifestyle  . Physical activity    Days per week: Not on file    Minutes per session: Not on file  . Stress: Not on file  Relationships  . Social Herbalist on phone: Not on file    Gets together: Not on file    Attends religious service: Not on file    Active member of club or organization: Not on file    Attends meetings of clubs or organizations: Not on file    Relationship status: Not on file  . Intimate partner violence    Fear of current or ex partner: Not on file    Emotionally abused: Not on file    Physically abused: Not on file    Forced sexual activity: Not on file  Other  Topics Concern  . Not on file  Social History Narrative  . Not on file    Review of Systems     Objective:   Physical Exam Vitals:   05/08/19 0956  BP: 118/85  Pulse: 98  Resp: 16  Temp: 98.7 F (37.1 C)  TempSrc: Oral  SpO2: 98%  Weight: 197 lb 3.2 oz (89.4 kg)   Exam not performed, but patient did show me a drain which appeared to have serosanguineous fluid at base.  Drain site was not examined, patient left prior to further evaluation.      Assessment & Plan:  Mia Wilkinson is a 33 y.o. female History of plastic surgery Persistent drain in place, scheduled to have removed few days ago, but was not able to remain in [place] to have treatment by initial surgeon. Advised that this should be evaluated by surgeon or plastic surgeon for proper removal and follow-up.  Offered to schedule with local surgeon, but she plans to [place] to  [place].  We will still try to obtain some options for her here locally if she would prefer.  No orders of the defined types were placed in this encounter.  Patient Instructions       If you have lab work done today you will be contacted with your lab results within the next 2 weeks.  If you have not heard from us then please contact us. The fastest way to get your results is to register for My Chart.   IF you received an x-ray today, you will receive an invoice from [facility]. Please contact [facility] at [phone] with questions or concerns regarding your invoice.   IF you received labwork today, you will receive an invoice from [facility]. Please contact [facility] at [phone] with questions or concerns regarding your invoice.   Our billing staff will not be able to assist you with questions regarding bills from these companies.  You will be contacted with the lab results as soon as they are available. The fastest way to get your results is to activate your My Chart account. Instructions are located on the last page of this paperwork. If you have not heard from us regarding the results in 2 weeks, please contact this office.

## 2019-07-25 ENCOUNTER — Ambulatory Visit: Payer: Self-pay | Admitting: Adult Health Nurse Practitioner

## 2019-07-29 ENCOUNTER — Ambulatory Visit: Payer: Self-pay | Admitting: Registered Nurse

## 2019-07-31 ENCOUNTER — Other Ambulatory Visit: Payer: Self-pay

## 2019-07-31 ENCOUNTER — Ambulatory Visit (INDEPENDENT_AMBULATORY_CARE_PROVIDER_SITE_OTHER): Payer: Self-pay | Admitting: Registered Nurse

## 2019-07-31 ENCOUNTER — Encounter: Payer: Self-pay | Admitting: Registered Nurse

## 2019-07-31 VITALS — BP 114/76 | HR 90 | Temp 99.1°F | Ht 62.21 in | Wt 189.2 lb

## 2019-07-31 DIAGNOSIS — K439 Ventral hernia without obstruction or gangrene: Secondary | ICD-10-CM

## 2019-07-31 NOTE — Patient Instructions (Signed)
° ° ° °  If you have lab work done today you will be contacted with your lab results within the next 2 weeks.  If you have not heard from us then please contact us. The fastest way to get your results is to register for My Chart. ° ° °IF you received an x-ray today, you will receive an invoice from [facility]. Please contact [facility] at [phone] with questions or concerns regarding your invoice.  ° °IF you received labwork today, you will receive an invoice from [facility]. Please contact [facility] at [phone] with questions or concerns regarding your invoice.  ° °Our billing staff will not be able to assist you with questions regarding bills from these companies. ° °You will be contacted with the lab results as soon as they are available. The fastest way to get your results is to activate your My Chart account. Instructions are located on the last page of this paperwork. If you have not heard from us regarding the results in 2 weeks, please contact this office. °  ° ° ° °

## 2019-07-31 NOTE — Progress Notes (Signed)
Acute Office Visit  Subjective:    Patient ID: Mia Wilkinson, female    DOB: Mar 20, 1986, 33 y.o.   MRN: [id number]  Chief Complaint  Patient presents with  . Hernia    starting to havve pain, hurts more when lying down or during normal activity    HPI Patient is in today for concern for a hernia.  Notes that this may have been going on for quite some time - previously believed it to be fat. However, recently has started to irritate her and give her some abdominal pain. Notes that when she engages her abdominal muscles, the hernia worsens. Pain is worst at the end of the day, usually fine in mornings.  No changes to bowel movement frequency or consistency. No nvd. No fever, chills, malaise, fatigue.   Past Medical History:  Diagnosis Date  . Anemia   . GERD (gastroesophageal reflux disease)    with pregnancy    Past Surgical History:  Procedure Laterality Date  . CESAREAN SECTION     x 3  . CESAREAN SECTION N/A 12/18/2013   Procedure: Repeat CESAREAN SECTION;  Surgeon: Frederico Hamman, MD;  Location: [facility];  Service: Obstetrics;  Laterality: N/A;  . CESAREAN SECTION N/A 01/13/2015   Procedure: REPEAT CESAREAN SECTION;  Surgeon: Frederico Hamman, MD;  Location: [facility];  Service: Obstetrics;  Laterality: N/A;    Family History  Problem Relation Age of Onset  . Hypertension Mother     Social History   Socioeconomic History  . Marital status: Single    Spouse name: Not on file  . Number of children: Not on file  . Years of education: Not on file  . Highest education level: Not on file  Occupational History  . Not on file  Social Needs  . Financial resource strain: Not on file  . Food insecurity    Worry: Not on file    Inability: Not on file  . Transportation needs    Medical: Not on file    Non-medical: Not on file  Tobacco Use  . Smoking status: Never Smoker  . Smokeless tobacco: Never Used  Substance and Sexual Activity  . Alcohol use: No  . Drug  use: No  . Sexual activity: Yes    Birth control/protection: None  Lifestyle  . Physical activity    Days per week: Not on file    Minutes per session: Not on file  . Stress: Not on file  Relationships  . Social Herbalist on phone: Not on file    Gets together: Not on file    Attends religious service: Not on file    Active member of club or organization: Not on file    Attends meetings of clubs or organizations: Not on file    Relationship status: Not on file  . Intimate partner violence    Fear of current or ex partner: Not on file    Emotionally abused: Not on file    Physically abused: Not on file    Forced sexual activity: Not on file  Other Topics Concern  . Not on file  Social History Narrative  . Not on file    Outpatient Medications Prior to Visit  Medication Sig Dispense Refill  . Ascorbic Acid (VITAMIN C) 100 MG tablet Take 100 mg by mouth daily.    . Multiple Vitamins-Minerals (ONE-A-DAY VITACRAVES ADULT) CHEW Chew by mouth.    . Prenatal-DSS-FeCb-FeGl-FA (CITRANATAL BLOOM) 90-1 MG  TABS Take 1 tablet by mouth daily before breakfast. 30 tablet 11   No facility-administered medications prior to visit.     No Known Allergies  ROS Per hpi      Objective:    Physical Exam  Constitutional: She is oriented to person, place, and time. She appears well-developed and well-nourished. No distress.  Cardiovascular: Normal rate and regular rhythm.  Pulmonary/Chest: Effort normal. No respiratory distress.  Abdominal: Soft. Bowel sounds are normal. She exhibits distension (ventral hernia). She exhibits no mass. There is no abdominal tenderness. There is no rebound and no guarding. A hernia is present. Hernia confirmed positive in the ventral area.    Neurological: She is alert and oriented to person, place, and time.  Skin: Skin is warm and dry. No rash noted. She is not diaphoretic. No erythema. No pallor.  Psychiatric: She has a normal mood and affect.  Her behavior is normal. Judgment and thought content normal.  Nursing note and vitals reviewed.   BP 114/76   Pulse 90   Temp 99.1 F (37.3 C)   Ht 5' 2.21" (1.58 m)   Wt 189 lb 3.2 oz (85.8 kg)   LMP 06/23/2019   SpO2 100%   BMI 34.37 kg/m  Wt Readings from Last 3 Encounters:  07/31/19 189 lb 3.2 oz (85.8 kg)  05/08/19 197 lb 3.2 oz (89.4 kg)  04/16/19 200 lb (90.7 kg)    Health Maintenance Due  Topic Date Due  . TETANUS/TDAP  06/18/2004  . INFLUENZA VACCINE  04/06/2019    There are no preventive care reminders to display for this patient.   Lab Results  Component Value Date   TSH 1.640 04/16/2019   Lab Results  Component Value Date   WBC 6.4 04/24/2019   HGB 13.1 04/24/2019   HCT 41.1 04/24/2019   MCV 80 04/24/2019   PLT 209 04/24/2019   Lab Results  Component Value Date   NA 140 04/16/2019   K 4.0 04/16/2019   CO2 23 04/16/2019   GLUCOSE 92 04/16/2019   BUN 8 04/16/2019   CREATININE 0.77 04/16/2019   BILITOT <0.2 04/16/2019   ALKPHOS 81 04/16/2019   AST 16 04/16/2019   ALT 18 04/16/2019   PROT 7.8 04/16/2019   ALBUMIN 4.0 04/16/2019   CALCIUM 9.2 04/16/2019   ANIONGAP 6 12/01/2016   No results found for: CHOL No results found for: HDL No results found for: LDLCALC No results found for: TRIG No results found for: CHOLHDL Lab Results  Component Value Date   HGBA1C 5.6 04/16/2019       Assessment & Plan:   Problem List Items Addressed This Visit    None    Visit Diagnoses    Ventral hernia without obstruction or gangrene    -  Primary   Relevant Orders   Ambulatory referral to General Surgery       No orders of the defined types were placed in this encounter.  PLAN  Pt wishes to have surgical repair. Requesting referral to gen surg   Does not have insurance, told to inquire regarding pricing with gen surg  Reviewed ED precautions for obstruction or gangrene.  Patient encouraged to call clinic with any questions, comments, or  concerns.    [name], NP

## 2019-09-03 ENCOUNTER — Ambulatory Visit: Payer: Self-pay | Admitting: Adult Health Nurse Practitioner

## 2019-09-03 ENCOUNTER — Inpatient Hospital Stay (HOSPITAL_COMMUNITY)
Admission: AD | Admit: 2019-09-03 | Discharge: 2019-09-03 | Disposition: A | Discharge status: Home / Self Care | Payer: Self-pay | Attending: Obstetrics and Gynecology | Admitting: Obstetrics and Gynecology

## 2019-09-03 ENCOUNTER — Other Ambulatory Visit: Payer: Self-pay

## 2019-09-03 ENCOUNTER — Encounter (HOSPITAL_COMMUNITY): Payer: Self-pay | Admitting: Obstetrics and Gynecology

## 2019-09-03 DIAGNOSIS — B373 Candidiasis of vulva and vagina: Secondary | ICD-10-CM

## 2019-09-03 DIAGNOSIS — Z3A09 9 weeks gestation of pregnancy: Secondary | ICD-10-CM | POA: Insufficient documentation

## 2019-09-03 DIAGNOSIS — B3731 Acute candidiasis of vulva and vagina: Secondary | ICD-10-CM

## 2019-09-03 DIAGNOSIS — B379 Candidiasis, unspecified: Secondary | ICD-10-CM | POA: Insufficient documentation

## 2019-09-03 DIAGNOSIS — O23591 Infection of other part of genital tract in pregnancy, first trimester: Secondary | ICD-10-CM

## 2019-09-03 DIAGNOSIS — N760 Acute vaginitis: Secondary | ICD-10-CM

## 2019-09-03 DIAGNOSIS — O98811 Other maternal infectious and parasitic diseases complicating pregnancy, first trimester: Secondary | ICD-10-CM | POA: Insufficient documentation

## 2019-09-03 LAB — URINALYSIS, ROUTINE W REFLEX MICROSCOPIC
Bilirubin Urine: NEGATIVE
Glucose, UA: NEGATIVE mg/dL
Hgb urine dipstick: NEGATIVE
Ketones, ur: NEGATIVE mg/dL
Nitrite: NEGATIVE
Protein, ur: 30 mg/dL — AB
Specific Gravity, Urine: 1.029 (ref 1.005–1.030)
pH: 5 (ref 5.0–8.0)

## 2019-09-03 LAB — WET PREP, GENITAL
Clue Cells Wet Prep HPF POC: NONE SEEN
Sperm: NONE SEEN
Trich, Wet Prep: NONE SEEN
Yeast Wet Prep HPF POC: NONE SEEN

## 2019-09-03 LAB — POCT PREGNANCY, URINE: Preg Test, Ur: POSITIVE — AB

## 2019-09-03 MED ORDER — TERCONAZOLE 0.4 % VA CREA: 1.0000 | TOPICAL_CREAM | Freq: Every day | VAGINAL | 0 refills | Status: DC

## 2019-09-03 MED ORDER — NYSTATIN 100000 UNIT/GM EX CREA: TOPICAL_CREAM | CUTANEOUS | 0 refills | Status: DC

## 2019-09-03 MED ORDER — TRIAMCINOLONE ACETONIDE 0.1 % EX CREA: 1.0000 "application " | TOPICAL_CREAM | Freq: Two times a day (BID) | CUTANEOUS | 0 refills | Status: DC

## 2019-09-03 NOTE — Discharge Instructions (Signed)
Bacterial Vaginosis ° °Bacterial vaginosis is an infection of the vagina. It happens when too many normal germs (healthy bacteria) grow in the vagina. This infection puts you at risk for infections from sex (STIs). Treating this infection can lower your risk for some STIs. You should also treat this if you are pregnant. It can cause your baby to be born early. °Follow these instructions at home: °Medicines °· Take over-the-counter and prescription medicines only as told by your doctor. °· Take or use your antibiotic medicine as told by your doctor. Do not stop taking or using it even if you start to feel better. °General instructions °· If you your sexual partner is a woman, tell her that you have this infection. She needs to get treatment if she has symptoms. If you have a female partner, he does not need to be treated. °· During treatment: °? Avoid sex. °? Do not douche. °? Avoid alcohol as told. °? Avoid breastfeeding as told. °· Drink enough fluid to keep your pee (urine) clear or pale yellow. °· Keep your vagina and butt (rectum) clean. °? Wash the area with warm water every day. °? Wipe from front to back after you use the toilet. °· Keep all follow-up visits as told by your doctor. This is important. °Preventing this condition °· Do not douche. °· Use only warm water to wash around your vagina. °· Use protection when you have sex. This includes: °? Latex condoms. °? Dental dams. °· Limit how many people you have sex with. It is best to only have sex with the same person (be monogamous). °· Get tested for STIs. Have your partner get tested. °· Wear underwear that is cotton or lined with cotton. °· Avoid tight pants and pantyhose. This is most important in summer. °· Do not use any products that have nicotine or tobacco in them. These include cigarettes and e-cigarettes. If you need help quitting, ask your doctor. °· Do not use illegal drugs. °· Limit how much alcohol you drink. °Contact a doctor if: °· Your  symptoms do not get better, even after you are treated. °· You have more discharge or pain when you pee (urinate). °· You have a fever. °· You have pain in your belly (abdomen). °· You have pain with sex. °· Your bleed from your vagina between periods. °Summary °· This infection happens when too many germs (bacteria) grow in the vagina. °· Treating this condition can lower your risk for some infections from sex (STIs). °· You should also treat this if you are pregnant. It can cause early (premature) birth. °· Do not stop taking or using your antibiotic medicine even if you start to feel better. °This information is not intended to replace advice given to you by your health care provider. Make sure you discuss any questions you have with your health care provider. °Document Released: 05/31/2008 Document Revised: 08/04/2017 Document Reviewed: 05/07/2016 °Elsevier Patient Education © 2020 Elsevier Inc. ° °Vaginal Yeast infection, Adult ° °Vaginal yeast infection is a condition that causes vaginal discharge as well as soreness, swelling, and redness (inflammation) of the vagina. This is a common condition. Some women get this infection frequently. °What are the causes? °This condition is caused by a change in the normal balance of the yeast (candida) and bacteria that live in the vagina. This change causes an overgrowth of yeast, which causes the inflammation. °What increases the risk? °The condition is more likely to develop in women who: °· Take antibiotic medicines. °·   Have diabetes. °· Take birth control pills. °· Are pregnant. °· Douche often. °· Have a weak body defense system (immune system). °· Have been taking steroid medicines for a long time. °· Frequently wear tight clothing. °What are the signs or symptoms? °Symptoms of this condition include: °· White, thick, creamy vaginal discharge. °· Swelling, itching, redness, and irritation of the vagina. The lips of the vagina (vulva) may be affected as  well. °· Pain or a burning feeling while urinating. °· Pain during sex. °How is this diagnosed? °This condition is diagnosed based on: °· Your medical history. °· A physical exam. °· A pelvic exam. Your health care provider will examine a sample of your vaginal discharge under a microscope. Your health care provider may send this sample for testing to confirm the diagnosis. °How is this treated? °This condition is treated with medicine. Medicines may be over-the-counter or prescription. You may be told to use one or more of the following: °· Medicine that is taken by mouth (orally). °· Medicine that is applied as a cream (topically). °· Medicine that is inserted directly into the vagina (suppository). °Follow these instructions at home: ° °Lifestyle °· Do not have sex until your health care provider approves. Tell your sex partner that you have a yeast infection. That person should go to his or her health care provider and ask if they should also be treated. °· Do not wear tight clothes, such as pantyhose or tight pants. °· Wear breathable cotton underwear. °General instructions °· Take or apply over-the-counter and prescription medicines only as told by your health care provider. °· Eat more yogurt. This may help to keep your yeast infection from returning. °· Do not use tampons until your health care provider approves. °· Try taking a sitz bath to help with discomfort. This is a warm water bath that is taken while you are sitting down. The water should only come up to your hips and should cover your buttocks. Do this 3-4 times per day or as told by your health care provider. °· Do not douche. °· If you have diabetes, keep your blood sugar levels under control. °· Keep all follow-up visits as told by your health care provider. This is important. °Contact a health care provider if: °· You have a fever. °· Your symptoms go away and then return. °· Your symptoms do not get better with treatment. °· Your symptoms get  worse. °· You have new symptoms. °· You develop blisters in or around your vagina. °· You have blood coming from your vagina and it is not your menstrual period. °· You develop pain in your abdomen. °Summary °· Vaginal yeast infection is a condition that causes discharge as well as soreness, swelling, and redness (inflammation) of the vagina. °· This condition is treated with medicine. Medicines may be over-the-counter or prescription. °· Take or apply over-the-counter and prescription medicines only as told by your health care provider. °· Do not douche. Do not have sex or use tampons until your health care provider approves. °· Contact a health care provider if your symptoms do not get better with treatment or your symptoms go away and then return. °This information is not intended to replace advice given to you by your health care provider. Make sure you discuss any questions you have with your health care provider. °Document Released: 06/01/2005 Document Revised: 01/08/2018 Document Reviewed: 01/08/2018 °Elsevier Patient Education © 2020 Elsevier Inc. ° °

## 2019-09-03 NOTE — MAU Provider Note (Signed)
History     CSN: [id number]  Arrival date and time: 09/03/19 1049   First Provider Initiated Contact with Patient 09/03/19 1128      Chief Complaint  Patient presents with  . Possible Pregnancy  . Vaginal Bleeding  . vaginal burning   HPI Mia Wilkinson is a 33 y.o. [id number] at [redacted]w[redacted]d who presents to MAU with chief complaint of vulvar itching and burning, labial irritation and abnormal vaginal discharge. These are new problems with concurrent onset within the past week. Patient states she attempted symptom management with "some leftover BV medicine" but noticed worsening discharge on day two. She then attempted OTC Monistat but did not experience relief. She states she cleans herself with a clean washcloth via daily showers. She denies irritants, perfumes, detergents as part of her personal hygiene.  Patient clarifies that she has seen bleeding when she wipes after voiding, but the bleeding is associated with "being too rough" as well as scratching. She denies vaginal bleeding, dysuria, abdominal tenderness, fever or recent illness. She endorses normal viability ultrasound at the pregnancy care center.  Patient plans to return to Dr. Jodi M[facility]ning at [facility] for prenatal care.  OB History    Gravida  6   Para  5   Term  5   Preterm  0   AB  0   Living  5     SAB  0   TAB  0   Ectopic  0   Multiple  0   Live Births  5           Past Medical History:  Diagnosis Date  . Anemia   . GERD (gastroesophageal reflux disease)    with pregnancy    Past Surgical History:  Procedure Laterality Date  . CESAREAN SECTION     x 3  . CESAREAN SECTION N/A 12/18/2013   Procedure: Repeat CESAREAN SECTION;  Surgeon: Frederico Hamman, MD;  Location: [facility];  Service: Obstetrics;  Laterality: N/A;  . CESAREAN SECTION N/A 01/13/2015   Procedure: REPEAT CESAREAN SECTION;  Surgeon: Frederico Hamman, MD;  Location: [facility];  Service: Obstetrics;  Laterality: N/A;  . LIPOSUCTION   05/02/2019    Family History  Problem Relation Age of Onset  . Hypertension Mother     Social History   Tobacco Use  . Smoking status: Never Smoker  . Smokeless tobacco: Never Used  Substance Use Topics  . Alcohol use: No  . Drug use: No    Allergies: No Known Allergies  No medications prior to admission.    Review of Systems  Constitutional: Negative for chills, diaphoresis, fatigue and fever.  Gastrointestinal: Negative for abdominal pain.  Endocrine: Negative for polydipsia, polyphagia and polyuria.  Genitourinary: Positive for vaginal discharge and vaginal pain. Negative for difficulty urinating, dyspareunia, dysuria, flank pain, hematuria and vaginal bleeding.  Musculoskeletal: Negative for back pain.  All other systems reviewed and are negative.  Physical Exam   Blood pressure 118/75, pulse 85, temperature 98.3 F (36.8 C), resp. rate 18, height 5\' 3"  (1.6 m), weight 84.8 kg, last menstrual period 07/01/2019, SpO2 100 %, unknown if currently breastfeeding.  Physical Exam  Nursing note and vitals reviewed. Constitutional: She is oriented to person, place, and time. She appears well-developed and well-nourished.  Cardiovascular: Normal rate.  Respiratory: Effort normal and breath sounds normal.  GI: Soft.  Genitourinary:    Vaginal discharge present.     Genitourinary Comments: Significant pervasive thick white discharge throughout vaginal  vault. Vault is red, irritated, cervix closed, no CMT. Moderate amount of smooth white material visualized proximal to clitoral hood, labia, groin.   Neurological: She is alert and oriented to person, place, and time.  Skin: Skin is warm and dry.  Psychiatric: She has a normal mood and affect. Her behavior is normal. Judgment and thought content normal.    MAU Course/MDM  Procedures  --Encouraged patient to avoid using residual medication from previous prescriptions --No urinary symptoms, large Leuks on UA. Send for  culture  Patient Vitals for the past 24 hrs:  BP Temp Pulse Resp SpO2 Height Weight  09/03/19 1235 118/75 -- -- -- -- -- --  09/03/19 1128 118/71 -- 85 -- -- -- --  09/03/19 1110 127/73 98.3 F (36.8 C) (!) 115 18 100 % -- --  09/03/19 1106 -- -- -- -- -- 5\' 3"  (1.6 m) 84.8 kg   Results for orders placed or performed during the hospital encounter of 09/03/19 (from the past 24 hour(s))  Pregnancy, urine POC     Status: Abnormal   Collection Time: 09/03/19 11:11 AM  Result Value Ref Range   Preg Test, Ur POSITIVE (A) NEGATIVE  Urinalysis, Routine w reflex microscopic     Status: Abnormal   Collection Time: 09/03/19 11:41 AM  Result Value Ref Range   Color, Urine YELLOW YELLOW   APPearance CLOUDY (A) CLEAR   Specific Gravity, Urine 1.029 1.005 - 1.030   pH 5.0 5.0 - 8.0   Glucose, UA NEGATIVE NEGATIVE mg/dL   Hgb urine dipstick NEGATIVE NEGATIVE   Bilirubin Urine NEGATIVE NEGATIVE   Ketones, ur NEGATIVE NEGATIVE mg/dL   Protein, ur 30 (A) NEGATIVE mg/dL   Nitrite NEGATIVE NEGATIVE   Leukocytes,Ua LARGE (A) NEGATIVE   RBC / HPF 6-10 0 - 5 RBC/hpf   WBC, UA 6-10 0 - 5 WBC/hpf   Bacteria, UA MANY (A) NONE SEEN   Squamous Epithelial / LPF 0-5 0 - 5   Mucus PRESENT   Wet prep, genital     Status: Abnormal   Collection Time: 09/03/19 11:41 AM   Specimen: Cervix  Result Value Ref Range   Yeast Wet Prep HPF POC NONE SEEN NONE SEEN   Trich, Wet Prep NONE SEEN NONE SEEN   Clue Cells Wet Prep HPF POC NONE SEEN NONE SEEN   WBC, Wet Prep HPF POC MANY (A) NONE SEEN   Sperm NONE SEEN    Meds ordered this encounter  Medications  . triamcinolone cream (KENALOG) 0.1 %    Sig: Apply 1 application topically 2 (two) times daily. Externally    Dispense:  30 g    Refill:  0    Order Specific Question:   Supervising Provider    Answer:   Conan BowensDAVIS, KELLY M [[id number]]  . nystatin cream (MYCOSTATIN)    Sig: Apply externally to affected area 2 times daily    Dispense:  15 g    Refill:  0     Order Specific Question:   Supervising Provider    Answer:   Conan BowensDAVIS, KELLY M [[id number]]  . terconazole (TERAZOL 7) 0.4 % vaginal cream    Sig: Place 1 applicator vaginally at bedtime. Use for seven days    Dispense:  45 g    Refill:  0    Order Specific Question:   Supervising Provider    Answer:   Conan BowensDAVIS, KELLY M [[id number]]   Assessment and Plan  --33 y.o. [id number] at 5656w1d  --Yeast infection --  Concern for topical fungal infection --Urine culture in work --Discharge home in stable condition  Calvert Cantor, [place] 09/03/2019, 3:16 PM

## 2019-09-03 NOTE — MAU Note (Signed)
.   Mia Wilkinson is a 33 y.o. at [redacted]w[redacted]d here in MAU reporting: vaginal irritation that started a couple of days ago. States it feels like it is a burning throbbing sensation. Small amount of vaginal spotting this morning when she wipes  LMP: 07/01/19 Onset of complaint: couple of days ago Pain score: 10 Vitals:   09/03/19 1110  BP: 127/73  Pulse: (!) 115  Resp: 18  Temp: 98.3 F (36.8 C)  SpO2: 100%     FHT: Lab orders placed from triage: UA/UPT

## 2019-09-04 LAB — GC/CHLAMYDIA PROBE AMP (~~LOC~~) NOT AT ARMC
Chlamydia: NEGATIVE
Comment: NEGATIVE
Comment: NORMAL
Neisseria Gonorrhea: NEGATIVE

## 2019-09-04 LAB — CULTURE, OB URINE: Culture: <10000 — AB

## 2019-09-09 ENCOUNTER — Ambulatory Visit (INDEPENDENT_AMBULATORY_CARE_PROVIDER_SITE_OTHER): Payer: Self-pay

## 2019-09-09 DIAGNOSIS — K439 Ventral hernia without obstruction or gangrene: Secondary | ICD-10-CM | POA: Insufficient documentation

## 2019-09-09 DIAGNOSIS — O0990 Supervision of high risk pregnancy, unspecified, unspecified trimester: Secondary | ICD-10-CM | POA: Insufficient documentation

## 2019-09-09 MED ORDER — BLOOD PRESSURE KIT DEVI: 1.0000 | 0 refills | Status: DC | PRN

## 2019-09-09 MED ORDER — PRENATE PIXIE 10-0.6-0.4-200 MG PO CAPS: 1.0000 | ORAL_CAPSULE | Freq: Every day | ORAL | 12 refills | Status: DC

## 2019-09-09 NOTE — Progress Notes (Signed)
Patient seen and assessed by nursing staff during this encounter. I have reviewed the chart and agree with the documentation and plan.  Catalina Antigua, MD 09/09/2019 3:13 PM

## 2019-09-09 NOTE — Addendum Note (Signed)
Addended by: Dalphine Handing on: 09/09/2019 02:29 PM   Modules accepted: Orders

## 2019-09-09 NOTE — Progress Notes (Addendum)
  Virtual Visit via Telephone Note  I connected with [name] on 09/09/19 at  8:30 AM EST by telephone and verified that I am speaking with the correct person using two identifiers.  Location: Patient: Mia Wilkinson  Provider: Eduard Roux, CMA    I discussed the limitations, risks, security and privacy concerns of performing an evaluation and management service by telephone and the availability of in person appointments. I also discussed with the patient that there may be a patient responsible charge related to this service. The patient expressed understanding and agreed to proceed.   History of Present Illness: PRENATAL INTAKE SUMMARY  Mia Wilkinson presents today New OB Nurse Interview.  OB History    Gravida  6   Para  5   Term  5   Preterm  0   AB  0   Living  5     SAB  0   TAB  0   Ectopic  0   Multiple  0   Live Births  5          I have reviewed the patient's medical, obstetrical, social, and family histories, medications, and available lab results.  SUBJECTIVE Pt concerned about hernia affecting baby's growth.    Observations/Objective: Initial nurse interview for history/labs (New OB)  EDD: 04/06/2020 GA: [redacted]w[redacted]d G6P5005 FHT: non face to face interview   GENERAL APPEARANCE: non face to face interview   Assessment and Plan: High risk pregnancy - Hernia Prenatal care-[facility]  Labs will be completed at next visit with provider. Pt to sign up for Babyscripts Rx for BP cuff sent to [facility] Pt needs Rx for PNV's sent to her [facility].   Follow Up Instructions:   I discussed the assessment and treatment plan with the patient. The patient was provided an opportunity to ask questions and all were answered. The patient agreed with the plan and demonstrated an understanding of the instructions.   The patient was advised to call back or seek an in-person evaluation if the symptoms worsen or if the condition fails to improve as anticipated.  I  provided 20 minutes of non-face-to-face time during this encounter.   Dalphine Handing, CMA

## 2019-09-16 ENCOUNTER — Encounter: Payer: Self-pay | Admitting: Obstetrics and Gynecology

## 2019-09-19 ENCOUNTER — Other Ambulatory Visit: Payer: Self-pay

## 2019-09-19 ENCOUNTER — Encounter: Payer: Self-pay | Admitting: Family Medicine

## 2019-09-19 ENCOUNTER — Ambulatory Visit (INDEPENDENT_AMBULATORY_CARE_PROVIDER_SITE_OTHER): Payer: Medicaid Other | Admitting: Family Medicine

## 2019-09-19 VITALS — BP 107/75 | HR 101 | Wt 185.2 lb

## 2019-09-19 DIAGNOSIS — Z113 Encounter for screening for infections with a predominantly sexual mode of transmission: Secondary | ICD-10-CM

## 2019-09-19 DIAGNOSIS — N898 Other specified noninflammatory disorders of vagina: Secondary | ICD-10-CM

## 2019-09-19 DIAGNOSIS — Z3A11 11 weeks gestation of pregnancy: Secondary | ICD-10-CM | POA: Diagnosis not present

## 2019-09-19 DIAGNOSIS — R519 Headache, unspecified: Secondary | ICD-10-CM

## 2019-09-19 DIAGNOSIS — B9689 Other specified bacterial agents as the cause of diseases classified elsewhere: Secondary | ICD-10-CM

## 2019-09-19 DIAGNOSIS — B379 Candidiasis, unspecified: Secondary | ICD-10-CM | POA: Diagnosis not present

## 2019-09-19 DIAGNOSIS — R110 Nausea: Secondary | ICD-10-CM

## 2019-09-19 DIAGNOSIS — O0991 Supervision of high risk pregnancy, unspecified, first trimester: Secondary | ICD-10-CM

## 2019-09-19 DIAGNOSIS — N760 Acute vaginitis: Secondary | ICD-10-CM | POA: Diagnosis not present

## 2019-09-19 DIAGNOSIS — O34219 Maternal care for unspecified type scar from previous cesarean delivery: Secondary | ICD-10-CM

## 2019-09-19 DIAGNOSIS — O0990 Supervision of high risk pregnancy, unspecified, unspecified trimester: Secondary | ICD-10-CM

## 2019-09-19 DIAGNOSIS — O23591 Infection of other part of genital tract in pregnancy, first trimester: Secondary | ICD-10-CM

## 2019-09-19 DIAGNOSIS — O26891 Other specified pregnancy related conditions, first trimester: Secondary | ICD-10-CM

## 2019-09-19 DIAGNOSIS — B373 Candidiasis of vulva and vagina: Secondary | ICD-10-CM

## 2019-09-19 DIAGNOSIS — Z3481 Encounter for supervision of other normal pregnancy, first trimester: Secondary | ICD-10-CM | POA: Diagnosis not present

## 2019-09-19 MED ORDER — NYSTATIN 100000 UNIT/GM EX POWD: 1.0000 "application " | Freq: Three times a day (TID) | CUTANEOUS | 0 refills | Status: DC

## 2019-09-19 MED ORDER — BUTALBITAL-APAP-CAFFEINE 50-325-40 MG PO TABS: 1.0000 | ORAL_TABLET | Freq: Four times a day (QID) | ORAL | 0 refills | Status: DC | PRN

## 2019-09-19 MED ORDER — TERCONAZOLE 0.4 % VA CREA: 1.0000 | TOPICAL_CREAM | Freq: Every day | VAGINAL | 0 refills | Status: DC

## 2019-09-19 MED ORDER — ONDANSETRON HCL 4 MG PO TABS: 4.0000 mg | ORAL_TABLET | Freq: Every day | ORAL | 1 refills | Status: DC | PRN

## 2019-09-19 NOTE — Patient Instructions (Addendum)
Natural Remedies for Vaginosis  Option #1 1 Tbsp Fractitionated Coconut Oil 10 drops of Melaleuca (Tea Tree) Oil  Mix ingredients together well.  Soak 3-4 tampons (in applicators) in that mixture until all or mostly all mixture is soaked up into the tampons.  Insert 1 saturated tampon vaginally and wear overnight for 3-4 nights.    Option #2 (sometimes to be used in conjunction with option #1) Fill tub with enough to cover lap/lower abdomen warm water.  Mix 1/2 cup of baking soda in water.  Soak in water/baking soda mixture for at least 20 minutes.  Be sure to swish water in between legs to get as much in vagina as possible.  This soak should be done after sexual intercourse and menstrual cycles.    GO WHITE: Soap: UNSCENTED Dove (white box light green writing) Laundry detergent (underwear)- Dreft or [employer] unscented WHITE 100% cotton panties (NOT just cotton crouch) Sanitary napkin/panty liners: UNSCENTED.  If it doesn't SAY unscented it can have a scent/perfume    NO PERFUMES OR LOTIONS OR POTIONS in the vulvar area (may use regular [place]) Condoms: hypoallergenic only. Non dyed (no color) Toilet papers: white only Wash clothes: use a separate wash cloth. WHITE.  Wash in [place].   You can purchase Tea Tree Oil locally at:  [employer] [address] [place] [address]  [employer] [address] [place] [address]  Advise that these alternatives will not replace the need to be evaluated if symptoms persist. You will need to seek care at an OB/GYN provider.  I would also recommend taking a pro-biotic three times daily. Another alternative would be to get Audree Camel from your local pharmacy, or to get the Honey Pot vaginal suppositories from [employer].

## 2019-09-19 NOTE — Progress Notes (Signed)
Subjective:  Mia Wilkinson is a 34 y.o. [phone] at [redacted]w[redacted]d being seen today for ongoing prenatal care.  She is currently monitored for the following issues for this high-risk pregnancy and has History of cesarean delivery, currently pregnant; Supervision of high risk pregnancy, antepartum; and Hernia, ventral on their problem list.  Patient reports having headaches with this pregnancy, do resolve with Tylenol. She has noticed a decreased appetite, and endorses mild nausea.  Contractions: Not present. Vag. Bleeding: None.   . Denies leaking of fluid.   The following portions of the patient's history were reviewed and updated as appropriate: allergies, current medications, past family history, past medical history, past social history, past surgical history and problem list. Problem list updated.  Objective:   Vitals:   09/19/19 1336  BP: 107/75  Pulse: (!) 101  Weight: 84 kg    Fetal Status: Fetal Heart Rate (bpm): 161         General:  Alert, oriented and cooperative. Patient is in no acute distress.  Skin: Skin is warm and dry. No rash noted.   Cardiovascular: Normal heart rate noted  Respiratory: Normal respiratory effort, no problems with respiration noted  Abdomen: Soft, gravid, appropriate for gestational age. Pain/Pressure: Absent     Pelvic: Vag. Bleeding: None     Cervical exam deferred      Copious amounts of thick, white, curd-like discharge  Extremities: Normal range of motion.  Edema: None  Mental Status: Normal mood and affect. Normal behavior. Normal judgment and thought content.   Urinalysis:       Physical exam chaperoned by Angelica Pou, CMA.  Assessment and Plan:  Pregnancy: G6P5005 at [redacted]w[redacted]d  1. Supervision of high risk pregnancy, antepartum - History of CS x5, desires repeat with BTL - Cervicovaginal ancillary only( ) - Genetic Screening - Culture, OB Urine*LC - Obstetric Panel (and HIV)*LC - Babyscripts Schedule Optimization - Enroll Patient in  Babyscripts  2. Yeast infection - Normally every time she has an antibiotic  - HgB A1c - terconazole (TERAZOL 7) 0.4 % vaginal cream; Place 1 applicator vaginally at bedtime.  Dispense: 45 g; Refill: 0 - nystatin (MYCOSTATIN/NYSTOP) powder; Apply 1 application topically 3 (three) times daily.  Dispense: 15 g; Refill: 0 - recommended taking a probiotic, alternative preventative measures discussed  3. Pregnancy headache in first trimester - butalbital-acetaminophen-caffeine (FIORICET) 50-325-40 MG tablet; Take 1-2 tablets by mouth every 6 (six) hours as needed for headache.  Dispense: 20 tablet; Refill: 0  4. Nausea - ondansetron (ZOFRAN) 4 MG tablet; Take 1 tablet (4 mg total) by mouth daily as needed for nausea or vomiting.  Dispense: 30 tablet; Refill: 1  Preterm labor symptoms and general obstetric precautions including but not limited to vaginal bleeding, contractions, leaking of fluid and fetal movement were reviewed in detail with the patient. Please refer to After Visit Summary for other counseling recommendations.  Return in about 4 weeks (around 10/17/2019), or ROB.   Eduar Kumpf L, DO

## 2019-09-19 NOTE — Progress Notes (Signed)
Pt presents for Initial OB visit. Pt has concerns about yeast.

## 2019-09-19 NOTE — Assessment & Plan Note (Signed)
C/S x5

## 2019-09-20 ENCOUNTER — Telehealth: Payer: Self-pay | Admitting: Family Medicine

## 2019-09-20 DIAGNOSIS — E611 Iron deficiency: Secondary | ICD-10-CM

## 2019-09-20 LAB — OBSTETRIC PANEL, INCLUDING HIV
Antibody Screen: NEGATIVE
Basophils Absolute: 0 10*3/uL (ref 0.0–0.2)
Basos: 0 %
EOS (ABSOLUTE): 0.1 10*3/uL (ref 0.0–0.4)
Eos: 1 %
HIV Screen 4th Generation wRfx: NONREACTIVE
Hematocrit: 36.4 % (ref 34.0–46.6)
Hemoglobin: 11.9 g/dL (ref 11.1–15.9)
Hepatitis B Surface Ag: NEGATIVE
Immature Grans (Abs): 0 10*3/uL (ref 0.0–0.1)
Immature Granulocytes: 0 %
Lymphocytes Absolute: 1.4 10*3/uL (ref 0.7–3.1)
Lymphs: 17 %
MCH: 24.7 pg — ABNORMAL LOW (ref 26.6–33.0)
MCHC: 32.7 g/dL (ref 31.5–35.7)
MCV: 76 fL — ABNORMAL LOW (ref 79–97)
Monocytes Absolute: 0.6 10*3/uL (ref 0.1–0.9)
Monocytes: 7 %
Neutrophils Absolute: 6.3 10*3/uL (ref 1.4–7.0)
Neutrophils: 75 %
Platelets: 220 10*3/uL (ref 150–450)
RBC: 4.81 x10E6/uL (ref 3.77–5.28)
RDW: 16.8 % — ABNORMAL HIGH (ref 11.7–15.4)
RPR Ser Ql: NONREACTIVE
Rh Factor: POSITIVE
Rubella Antibodies, IGG: 1.1 index (ref >0.99)
WBC: 8.4 10*3/uL (ref 3.4–10.8)

## 2019-09-20 LAB — HEMOGLOBIN A1C
Est. average glucose Bld gHb Est-mCnc: 114 mg/dL
Hgb A1c MFr Bld: 5.6 % (ref 4.8–5.6)

## 2019-09-20 MED ORDER — FERROUSUL 325 (65 FE) MG PO TABS: 325.0000 mg | ORAL_TABLET | ORAL | 3 refills | Status: DC

## 2019-09-20 NOTE — Telephone Encounter (Signed)
Script for PO iron sent

## 2019-09-21 LAB — CULTURE, OB URINE

## 2019-09-21 LAB — URINE CULTURE, OB REFLEX

## 2019-09-23 LAB — CERVICOVAGINAL ANCILLARY ONLY
Bacterial Vaginitis (gardnerella): POSITIVE — AB
Candida Glabrata: NEGATIVE
Candida Vaginitis: NEGATIVE
Chlamydia: NEGATIVE
Comment: NEGATIVE
Comment: NEGATIVE
Comment: NEGATIVE
Comment: NEGATIVE
Comment: NEGATIVE
Comment: NORMAL
Neisseria Gonorrhea: NEGATIVE
Trichomonas: NEGATIVE

## 2019-09-26 ENCOUNTER — Other Ambulatory Visit: Payer: Self-pay

## 2019-09-26 DIAGNOSIS — N760 Acute vaginitis: Secondary | ICD-10-CM

## 2019-09-26 DIAGNOSIS — B9689 Other specified bacterial agents as the cause of diseases classified elsewhere: Secondary | ICD-10-CM

## 2019-09-26 MED ORDER — METRONIDAZOLE 0.75 % VA GEL: 1.0000 | Freq: Every day | VAGINAL | 1 refills | Status: DC

## 2019-09-30 ENCOUNTER — Encounter: Payer: Self-pay | Admitting: Family Medicine

## 2019-10-17 ENCOUNTER — Other Ambulatory Visit: Payer: Self-pay

## 2019-10-17 ENCOUNTER — Ambulatory Visit (INDEPENDENT_AMBULATORY_CARE_PROVIDER_SITE_OTHER): Payer: Medicaid Other

## 2019-10-17 VITALS — BP 106/75 | HR 89 | Temp 98.7°F | Wt 186.0 lb

## 2019-10-17 DIAGNOSIS — O99612 Diseases of the digestive system complicating pregnancy, second trimester: Secondary | ICD-10-CM

## 2019-10-17 DIAGNOSIS — Z3A15 15 weeks gestation of pregnancy: Secondary | ICD-10-CM

## 2019-10-17 DIAGNOSIS — O0990 Supervision of high risk pregnancy, unspecified, unspecified trimester: Secondary | ICD-10-CM | POA: Diagnosis not present

## 2019-10-17 DIAGNOSIS — K439 Ventral hernia without obstruction or gangrene: Secondary | ICD-10-CM

## 2019-10-17 DIAGNOSIS — O34219 Maternal care for unspecified type scar from previous cesarean delivery: Secondary | ICD-10-CM

## 2019-10-17 DIAGNOSIS — O23592 Infection of other part of genital tract in pregnancy, second trimester: Secondary | ICD-10-CM

## 2019-10-17 DIAGNOSIS — N760 Acute vaginitis: Secondary | ICD-10-CM

## 2019-10-17 DIAGNOSIS — B9689 Other specified bacterial agents as the cause of diseases classified elsewhere: Secondary | ICD-10-CM

## 2019-10-17 NOTE — Progress Notes (Signed)
Pt c/o malodorous vaginal discharge.  Pt prefers gel rx Pt has questions about hernia surgery during c/s & BTL

## 2019-10-17 NOTE — Progress Notes (Signed)
HIGH-RISK PREGNANCY OFFICE VISIT  Patient name: MYRTHA TONKOVICH MRN [id number]  Date of birth: Jul 14, 1986 Chief Complaint:   Routine Prenatal Visit  Subjective:   Mia Wilkinson is a 34 y.o. [id number] female at [redacted]w[redacted]d with an Estimated Date of Delivery: 04/06/20 being seen today for ongoing management of a high-risk pregnancy. Patient with history of C/S x 5 and current Hernia.  She questions if she will be able to schedule her hernia surgery with her C/S. Patient also questions who will be performing her C/S, particularly if it will be Dr. Jodi Mourning. Patient reports vaginal odor that smells like"foul-odor smell."  She states that she noticed the odor when she started taking the Terazol cream and it continued despite the Metrogel treatment.  Patient denies vaginal exam today or performing self swab stating that "I don't have time for all that because I have to be at work at 3pm."  She denies discharge, itching, burning, and vaginal irritation.  She is not currently sexually active.  Contractions: Not present. Vag. Bleeding: None.  Movement: Present.  Patient Active Problem List   Diagnosis Date Noted  . Supervision of high risk pregnancy, antepartum 09/09/2019  . Hernia, ventral 09/09/2019  . History of cesarean delivery, currently pregnant 12/18/2013   Reviewed past medical,surgical, social, obstetrical and family history as well as problem list, medications and allergies.  Objective   Vitals:   10/17/19 1350  BP: 106/75  Pulse: 89  Temp: 98.7 F (37.1 C)  Weight: 186 lb (84.4 kg)  Body mass index is 32.95 kg/m.        Physical Examination:   General appearance: Well appearing, and in no distress  Mental status: Alert, oriented to person, place, and time  Skin: Warm & dry  Cardiovascular: Normal heart rate noted  Respiratory: Normal respiratory effort, no distress  Abdomen: Soft, gravid, nontender, AGA with Fundal height of    Pelvic: Cervical exam deferred           Extremities:  Edema: None  Fetal Status: Fetal Heart Rate (bpm): 155  Movement: Present   No results found for this or any previous visit (from the past 24 hour(s)).  Assessment & Plan:  High-risk pregnancy of a 34 y.o., [id number] at [redacted]w[redacted]d with an Estimated Date of Delivery: 04/06/20   1. Supervision of high risk pregnancy, antepartum -anticipatory guidance for upcoming visits. -Encouraged activation of mychart for releasing of results and communications as needed.  2. BV (bacterial vaginosis) -Review of medication shows patient with refill of metrogel. -Informed of refill and encouraged to call if symptoms worsen or do not improve with treatment.  3. History of cesarean delivery, currently pregnant -Discussed that C/S would be scheduled based on date and no guarantee of surgeon unless something arranged prior to with said surgeon. -Informed that Dr. Jodi Mourning no longer practicing in hospital setting and can not come in for her surgery. -Informed that surgery will be performed by attending and fellow. -Patient expresses disappointment with not knowing who will be performing her surgery and states she is considering transferring to another practice to guarantee her surgeon.  -Patient encouraged to rotate through MDs that are available at [facility] and informed that she can meet other MDs via virtual visits.   4. Ventral hernia without obstruction or gangrene -Informed that provider is unsure of hernia surgery can be repaired during C/S as general surgery would need to be involved. -Discussed need to consult with attending MD and patient to be scheduled with MD  at next visit for discussion.    Meds: No orders of the defined types were placed in this encounter.  Reviewed: Preterm labor symptoms and general obstetric precautions including but not limited to vaginal bleeding, contractions, leaking of fluid and fetal movement were reviewed in detail with the patient.  All questions were answered.  Follow-up:  Return in about 4 weeks (around 11/14/2019) for HR-ROB via Virtual Visit.  Orders Placed This Encounter  Procedures  . AFP, Serum, Open Spina Bifida   Cherre Robins MSN, CNM 10/17/2019

## 2019-10-17 NOTE — Patient Instructions (Signed)
Bacterial Vaginosis  Bacterial vaginosis is a vaginal infection that occurs when the normal balance of bacteria in the vagina is disrupted. It results from an overgrowth of certain bacteria. This is the most common vaginal infection among women ages 15-44. Because bacterial vaginosis increases your risk for STIs (sexually transmitted infections), getting treated can help reduce your risk for chlamydia, gonorrhea, herpes, and HIV (human immunodeficiency virus). Treatment is also important for preventing complications in pregnant women, because this condition can cause an early (premature) delivery. What are the causes? This condition is caused by an increase in harmful bacteria that are normally present in small amounts in the vagina. However, the reason that the condition develops is not fully understood. What increases the risk? The following factors may make you more likely to develop this condition:  Having a new sexual partner or multiple sexual partners.  Having unprotected sex.  Douching.  Having an intrauterine device (IUD).  Smoking.  Drug and alcohol abuse.  Taking certain antibiotic medicines.  Being pregnant. You cannot get bacterial vaginosis from toilet seats, bedding, swimming pools, or contact with objects around you. What are the signs or symptoms? Symptoms of this condition include:  Grey or white vaginal discharge. The discharge can also be watery or foamy.  A fish-like odor with discharge, especially after sexual intercourse or during menstruation.  Itching in and around the vagina.  Burning or pain with urination. Some women with bacterial vaginosis have no signs or symptoms. How is this diagnosed? This condition is diagnosed based on:  Your medical history.  A physical exam of the vagina.  Testing a sample of vaginal fluid under a microscope to look for a large amount of bad bacteria or abnormal cells. Your health care provider may use a cotton swab or  a small wooden spatula to collect the sample. How is this treated? This condition is treated with antibiotics. These may be given as a pill, a vaginal cream, or a medicine that is put into the vagina (suppository). If the condition comes back after treatment, a second round of antibiotics may be needed. Follow these instructions at home: Medicines  Take over-the-counter and prescription medicines only as told by your health care provider.  Take or use your antibiotic as told by your health care provider. Do not stop taking or using the antibiotic even if you start to feel better. General instructions  If you have a female sexual partner, tell her that you have a vaginal infection. She should see her health care provider and be treated if she has symptoms. If you have a female sexual partner, he does not need treatment.  During treatment: ? Avoid sexual activity until you finish treatment. ? Do not douche. ? Avoid alcohol as directed by your health care provider. ? Avoid breastfeeding as directed by your health care provider.  Drink enough water and fluids to keep your urine clear or pale yellow.  Keep the area around your vagina and rectum clean. ? Wash the area daily with warm water. ? Wipe yourself from front to back after using the toilet.  Keep all follow-up visits as told by your health care provider. This is important. How is this prevented?  Do not douche.  Wash the outside of your vagina with warm water only.  Use protection when having sex. This includes latex condoms and dental dams.  Limit how many sexual partners you have. To help prevent bacterial vaginosis, it is best to have sex with just one partner (  monogamous).  Make sure you and your sexual partner are tested for STIs.  Wear cotton or cotton-lined underwear.  Avoid wearing tight pants and pantyhose, especially during summer.  Limit the amount of alcohol that you drink.  Do not use any products that contain  nicotine or tobacco, such as cigarettes and e-cigarettes. If you need help quitting, ask your health care provider.  Do not use illegal drugs. Where to find more information  Centers for Disease Control and Prevention: www.cdc.gov/std  American Sexual Health Association (ASHA): www.ashastd.org  U.S. Department of Health and Human Services, Office on Women's Health: www.womenshealth.gov/ or https://www.womenshealth.gov/a-z-topics/bacterial-vaginosis Contact a health care provider if:  Your symptoms do not improve, even after treatment.  You have more discharge or pain when urinating.  You have a fever.  You have pain in your abdomen.  You have pain during sex.  You have vaginal bleeding between periods. Summary  Bacterial vaginosis is a vaginal infection that occurs when the normal balance of bacteria in the vagina is disrupted.  Because bacterial vaginosis increases your risk for STIs (sexually transmitted infections), getting treated can help reduce your risk for chlamydia, gonorrhea, herpes, and HIV (human immunodeficiency virus). Treatment is also important for preventing complications in pregnant women, because the condition can cause an early (premature) delivery.  This condition is treated with antibiotic medicines. These may be given as a pill, a vaginal cream, or a medicine that is put into the vagina (suppository). This information is not intended to replace advice given to you by your health care provider. Make sure you discuss any questions you have with your health care provider. Document Revised: 08/04/2017 Document Reviewed: 05/07/2016 Elsevier Patient Education  2020 Elsevier Inc.  

## 2019-10-19 LAB — AFP, SERUM, OPEN SPINA BIFIDA
AFP MoM: 1.22
AFP Value: 35.9 ng/mL
Gest. Age on Collection Date: 15.4 weeks
Maternal Age At EDD: 34.8 yr
OSBR Risk 1 IN: 10000
Test Results:: NEGATIVE
Weight: 186 [lb_av]

## 2019-11-04 ENCOUNTER — Telehealth: Payer: Self-pay

## 2019-11-04 ENCOUNTER — Other Ambulatory Visit: Payer: Self-pay | Admitting: Obstetrics

## 2019-11-04 DIAGNOSIS — N3000 Acute cystitis without hematuria: Secondary | ICD-10-CM

## 2019-11-04 MED ORDER — NITROFURANTOIN MONOHYD MACRO 100 MG PO CAPS: 100.0000 mg | ORAL_CAPSULE | Freq: Two times a day (BID) | ORAL | 2 refills | Status: DC

## 2019-11-04 NOTE — Telephone Encounter (Signed)
Macrobid Rx for UTI 

## 2019-11-04 NOTE — Telephone Encounter (Signed)
TC from pt with c/o discomfort such as cramping Believes to be a UTI No bleeding, +FM  Pt also notes urge to use bath room then not able to go Nausea/vomiting Please advise pt states she is not able to come in the office due to work schedule Pharmacy [facility] on [address]

## 2019-11-12 ENCOUNTER — Encounter (HOSPITAL_COMMUNITY): Payer: Self-pay

## 2019-11-12 ENCOUNTER — Ambulatory Visit (HOSPITAL_COMMUNITY): Payer: Medicaid Other | Admitting: *Deleted

## 2019-11-12 ENCOUNTER — Other Ambulatory Visit: Payer: Self-pay

## 2019-11-12 ENCOUNTER — Ambulatory Visit (HOSPITAL_COMMUNITY)
Admission: RE | Admit: 2019-11-12 | Discharge: 2019-11-12 | Disposition: A | Discharge status: Home / Self Care | Payer: Medicaid Other | Source: Ambulatory Visit

## 2019-11-12 VITALS — BP 116/64 | HR 108 | Temp 97.7°F

## 2019-11-12 DIAGNOSIS — O0990 Supervision of high risk pregnancy, unspecified, unspecified trimester: Secondary | ICD-10-CM | POA: Diagnosis not present

## 2019-11-12 DIAGNOSIS — O99212 Obesity complicating pregnancy, second trimester: Secondary | ICD-10-CM

## 2019-11-12 DIAGNOSIS — O34219 Maternal care for unspecified type scar from previous cesarean delivery: Secondary | ICD-10-CM

## 2019-11-12 DIAGNOSIS — Z3A19 19 weeks gestation of pregnancy: Secondary | ICD-10-CM

## 2019-11-14 ENCOUNTER — Encounter: Payer: Self-pay | Admitting: Obstetrics and Gynecology

## 2019-11-14 ENCOUNTER — Telehealth (INDEPENDENT_AMBULATORY_CARE_PROVIDER_SITE_OTHER): Payer: Medicaid Other | Admitting: Obstetrics and Gynecology

## 2019-11-14 DIAGNOSIS — O0990 Supervision of high risk pregnancy, unspecified, unspecified trimester: Secondary | ICD-10-CM

## 2019-11-14 DIAGNOSIS — R519 Headache, unspecified: Secondary | ICD-10-CM

## 2019-11-14 DIAGNOSIS — O99212 Obesity complicating pregnancy, second trimester: Secondary | ICD-10-CM

## 2019-11-14 DIAGNOSIS — O99210 Obesity complicating pregnancy, unspecified trimester: Secondary | ICD-10-CM | POA: Insufficient documentation

## 2019-11-14 DIAGNOSIS — Z3A19 19 weeks gestation of pregnancy: Secondary | ICD-10-CM

## 2019-11-14 DIAGNOSIS — E669 Obesity, unspecified: Secondary | ICD-10-CM

## 2019-11-14 DIAGNOSIS — O34219 Maternal care for unspecified type scar from previous cesarean delivery: Secondary | ICD-10-CM

## 2019-11-14 DIAGNOSIS — O26891 Other specified pregnancy related conditions, first trimester: Secondary | ICD-10-CM

## 2019-11-14 DIAGNOSIS — O0992 Supervision of high risk pregnancy, unspecified, second trimester: Secondary | ICD-10-CM

## 2019-11-14 MED ORDER — ASPIRIN EC 81 MG PO TBEC: 81.0000 mg | DELAYED_RELEASE_TABLET | Freq: Every day | ORAL | 2 refills | Status: DC

## 2019-11-14 MED ORDER — BUTALBITAL-APAP-CAFFEINE 50-325-40 MG PO TABS: 1.0000 | ORAL_TABLET | Freq: Four times a day (QID) | ORAL | 0 refills | Status: DC | PRN

## 2019-11-14 MED ORDER — CYCLOBENZAPRINE HCL 10 MG PO TABS: 10.0000 mg | ORAL_TABLET | Freq: Three times a day (TID) | ORAL | 2 refills | Status: DC | PRN

## 2019-11-14 NOTE — Addendum Note (Signed)
Addended by: Dalphine Handing on: 11/14/2019 03:47 PM   Modules accepted: Orders

## 2019-11-14 NOTE — Progress Notes (Signed)
TELEHEALTH OBSTETRICS PRENATAL VIRTUAL VIDEO VISIT ENCOUNTER NOTE  [facility]   I connected with Mia Wilkinson on 11/14/19 at 11:00 AM EST by MyChart Video Encounter at home and verified that I am speaking with the correct person using two identifiers.   I discussed the limitations, risks, security and privacy concerns of performing an evaluation and management service virtually and the availability of in person appointments. I also discussed with the patient that there may be a patient responsible charge related to this service. The patient expressed understanding and agreed to proceed. Subjective:  Mia Wilkinson is a 34 y.o. [id number] at 3576w3d being seen today for ongoing prenatal care.  She is currently monitored for the following issues for this high-risk pregnancy and has History of cesarean delivery, currently pregnant; Supervision of high risk pregnancy, antepartum; Hernia, ventral; Maternal obesity affecting pregnancy, antepartum; and Frequent headaches on their problem list.  Patient reports headache.  Contractions: Not present. Vag. Bleeding: None.  Movement: Present. Denies any leaking of fluid.   The following portions of the patient's history were reviewed and updated as appropriate: allergies, current medications, past family history, past medical history, past social history, past surgical history and problem list.   Objective:  There were no vitals filed for this visit.  Fetal Status:     Movement: Present     General:  Alert, oriented and cooperative. Patient is in no acute distress.  Respiratory: Normal respiratory effort, no problems with respiration noted  Mental Status: Normal mood and affect. Normal behavior. Normal judgment and thought content.  Rest of physical exam deferred due to type of encounter  Imaging: US MFM OB DETAIL +14 WK  Result Date: 11/12/2019  ----------------------------------------------------------------------  OBSTETRICS REPORT                       (Signed Final 11/12/2019 11:21 am) ---------------------------------------------------------------------- Patient Info  ID #:       [id number]                          D.O.B.:  03/18/1986 (33 yrs)  Name:       Mia Wilkinson                 Visit Date: 11/12/2019 10:32 am ---------------------------------------------------------------------- Performed By  Performed By:     Lenise ArenaHannah Bazemore        Ref. Address:     Faculty Practice                    RDMS  Attending:        Ma RingsVictor Fang MD         [facility]  Referred By:      Gerrit HeckJESSICA EMLY                    CNM ---------------------------------------------------------------------- Orders   #  Description  Code         Ordered By   1  [place] MFM OB DETAIL +14 WK              [id number]     [facility]ESS[facility]  ----------------------------------------------------------------------   #  Order #                    Accession #                 Episode #   1  [id number]                  [id number]                  [id number]  ---------------------------------------------------------------------- Indications   Obesity complicating pregnancy, second         O99.212   trimester   Encounter for antenatal screening for          Z36.3   malformations   History of cesarean delivery, currently        O34.219   pregnant x5   [redacted] weeks gestation of pregnancy                Z3A.19  ---------------------------------------------------------------------- Vital Signs  Weight (lb): 186                               Height:        5'3"  BMI:         32.94 ---------------------------------------------------------------------- Fetal Evaluation  Num Of Fetuses:         1  Fetal Heart Rate(bpm):  159  Cardiac Activity:       Observed  Presentation:           Variable  Placenta:                Posterior  P. Cord Insertion:      Visualized, central  Amniotic Fluid  AFI FV:      Within normal limits                              Largest Pocket(cm)                              5.75 ---------------------------------------------------------------------- Biometry  BPD:      43.3  mm     G. Age:  19w 1d         49  %    CI:        76.54   %    70 - 86                                                          FL/HC:      19.3   %    16.1 - 18.3  HC:      156.8  mm     G. Age:  18w 4d         18  %    HC/AC:      1.11        1.09 - 1.39  AC:  140.8  mm     G. Age:  19w 3d         56  %    FL/BPD:     69.7   %  FL:       30.2  mm     G. Age:  19w 2d         50  %    FL/AC:      21.4   %    20 - 24  CER:      20.1  mm     G. Age:  19w 1d         49  %  LV:        5.8  mm  CM:        3.2  mm  Est. FW:     285  gm    0 lb 10 oz      55  % ---------------------------------------------------------------------- OB History  Gravidity:    6         Term:   5  Living:       5 ---------------------------------------------------------------------- Gestational Age  LMP:           19w 1d        Date:  07/01/19                 EDD:   04/06/20  U/S Today:     19w 1d                                        EDD:   04/06/20  Best:          19w 1d     Det. By:  LMP  (07/01/19)          EDD:   04/06/20 ---------------------------------------------------------------------- Anatomy  Cranium:               Appears normal         Aortic Arch:            Appears normal  Cavum:                 Appears normal         Ductal Arch:            Appears normal  Ventricles:            Appears normal         Diaphragm:              Appears normal  Choroid Plexus:        Appears normal         Stomach:                Appears normal, left                                                                        sided  Cerebellum:            Appears normal         Abdomen:  Appears normal  Posterior Fossa:       Appears normal          Abdominal Wall:         Appears nml (cord                                                                        insert, abd wall)  Nuchal Fold:           Appears normal         Cord Vessels:           Appears normal (3                                                                        vessel cord)  Face:                  Appears normal         Kidneys:                Appear normal                         (orbits and profile)  Lips:                  Appears normal         Bladder:                Appears normal  Thoracic:              Appears normal         Spine:                  Appears normal  Heart:                 Appears normal         Upper Extremities:      Appears normal                         (4CH, axis, and                         situs)  RVOT:                  Appears normal         Lower Extremities:      Appears normal  LVOT:                  Appears normal  Other:  Heels and 5th digit visualized. Nasal bone visualized. Open hands          visualized. ---------------------------------------------------------------------- Cervix Uterus Adnexa  Cervix  Length:           3.62  cm.  Normal appearance by transabdominal scan.  Uterus  No abnormality visualized.  Left Ovary  No adnexal mass visualized.  Right  Ovary  No adnexal mass visualized.  Cul De Sac  No free fluid seen.  Adnexa  No abnormality visualized. ---------------------------------------------------------------------- Comments  This patient was seen for a detailed fetal anatomy scan due  to maternal obesity. She denies any significant past medical  history and denies any problems in her current pregnancy.  She had a cell free DNA test earlier in her pregnancy which  indicated a low risk for trisomy 16, 72, and 13. A female fetus  is predicted.  She was informed that the fetal growth and amniotic fluid  level were appropriate for her gestational age.  There were no obvious fetal anomalies noted on today's  ultrasound exam.  The patient was  informed that anomalies may be missed due  to technical limitations. If the fetus is in a suboptimal position  or maternal habitus is increased, visualization of the fetus in  the maternal uterus may be impaired.  Follow up as indicated. ----------------------------------------------------------------------                   Johnell Comings, MD Electronically Signed Final Report   11/12/2019 11:21 am ----------------------------------------------------------------------   Assessment and Plan:  Pregnancy: [id number] at [redacted]w[redacted]d 1. Supervision of high risk pregnancy, antepartum Patient is doing well  2. History of cesarean delivery, currently pregnant Will be scheduled for repeat with BTL Last op note 01/2015 does not mention the presence or absence of adhesions  3. Maternal obesity affecting pregnancy, antepartum Rx ASA provided  4. Frequent headaches Refill on Fioricet provided Rx flexeril given Patient referred to see Allie Dimmer, headache specialist  Preterm labor symptoms and general obstetric precautions including but not limited to vaginal bleeding, contractions, leaking of fluid and fetal movement were reviewed in detail with the patient. I discussed the assessment and treatment plan with the patient. The patient was provided an opportunity to ask questions and all were answered. The patient agreed with the plan and demonstrated an understanding of the instructions. The patient was advised to call back or seek an in-person office evaluation/go to [facility] at [facility] for any urgent or concerning symptoms. Please refer to After Visit Summary for other counseling recommendations.   I provided 15 minutes of face-to-face time during this encounter.  Return in about 4 weeks (around 12/12/2019) for Virtual, ROB, High risk.  No future appointments.  Mora Bellman, MD [facility]

## 2019-11-14 NOTE — Progress Notes (Signed)
Pt is on the phone preparing for virtual visit with provider, [redacted]w[redacted]d. Anatomy scan US done on 11/12/19, normal.

## 2019-12-11 ENCOUNTER — Telehealth: Payer: Self-pay | Admitting: *Deleted

## 2019-12-11 ENCOUNTER — Encounter: Payer: Self-pay | Admitting: Obstetrics and Gynecology

## 2019-12-11 ENCOUNTER — Telehealth (INDEPENDENT_AMBULATORY_CARE_PROVIDER_SITE_OTHER): Payer: Medicaid Other | Admitting: Obstetrics and Gynecology

## 2019-12-11 DIAGNOSIS — O0990 Supervision of high risk pregnancy, unspecified, unspecified trimester: Secondary | ICD-10-CM

## 2019-12-11 DIAGNOSIS — Z6823 Body mass index (BMI) 23.0-23.9, adult: Secondary | ICD-10-CM

## 2019-12-11 DIAGNOSIS — O34219 Maternal care for unspecified type scar from previous cesarean delivery: Secondary | ICD-10-CM

## 2019-12-11 DIAGNOSIS — O99213 Obesity complicating pregnancy, third trimester: Secondary | ICD-10-CM

## 2019-12-11 DIAGNOSIS — R519 Headache, unspecified: Secondary | ICD-10-CM

## 2019-12-11 DIAGNOSIS — O0993 Supervision of high risk pregnancy, unspecified, third trimester: Secondary | ICD-10-CM

## 2019-12-11 DIAGNOSIS — O99210 Obesity complicating pregnancy, unspecified trimester: Secondary | ICD-10-CM

## 2019-12-11 DIAGNOSIS — E6609 Other obesity due to excess calories: Secondary | ICD-10-CM

## 2019-12-11 DIAGNOSIS — O26891 Other specified pregnancy related conditions, first trimester: Secondary | ICD-10-CM

## 2019-12-11 MED ORDER — BUTALBITAL-APAP-CAFFEINE 50-325-40 MG PO TABS: 1.0000 | ORAL_TABLET | Freq: Four times a day (QID) | ORAL | 3 refills | Status: DC | PRN

## 2019-12-11 NOTE — Progress Notes (Signed)
OBSTETRICS PRENATAL VIRTUAL VISIT ENCOUNTER NOTE  Provider location: [facility] for [facility] [place]   I connected with Mia Wilkinson on 12/11/19 at  4:15 PM EDT by MyChart Video Encounter at home and verified that I am speaking with the correct person using two identifiers.   I discussed the limitations, risks, security and privacy concerns of performing an evaluation and management service virtually and the availability of in person appointments. I also discussed with the patient that there may be a patient responsible charge related to this service. The patient expressed understanding and agreed to proceed. Subjective:  Mia Wilkinson is a 34 y.o. G6P5005 at [redacted]w[redacted]d being seen today for ongoing prenatal care.  She is currently monitored for the following issues for this high-risk pregnancy and has History of cesarean delivery, currently pregnant; Supervision of high risk pregnancy, antepartum; Hernia, ventral; Maternal obesity affecting pregnancy, antepartum; and Frequent headaches on their problem list.  Patient reports persistent migraines relieved with Fioricet.  Contractions: Not present. Vag. Bleeding: None.  Movement: Present. Denies any leaking of fluid.   The following portions of the patient's history were reviewed and updated as appropriate: allergies, current medications, past family history, past medical history, past social history, past surgical history and problem list.   Objective:  There were no vitals filed for this visit. Patient currently at a mall at the time of her visit  Fetal Status:     Movement: Present     General:  Alert, oriented and cooperative. Patient is in no acute distress.  Respiratory: Normal respiratory effort, no problems with respiration noted  Mental Status: Normal mood and affect. Normal behavior. Normal judgment and thought content.  Rest of physical exam deferred due to type of encounter  Imaging: [place] MFM OB DETAIL +14 WK  Result  Date: 11/12/2019 ----------------------------------------------------------------------  OBSTETRICS REPORT                       (Signed Final 11/12/2019 11:21 am) ---------------------------------------------------------------------- Patient Info  ID #:       [id number]                          D.O.B.:  [birth year] (33 yrs)  Name:       Mia Wilkinson                 Visit Date: 11/12/2019 10:32 am ---------------------------------------------------------------------- Performed By  Performed By:     Mia Wilkinson        Ref. Address:     Faculty Practice                    RDMS  Attending:        Ma Rings MD         [facility]  Referred By:      Mia Wilkinson                    CNM ---------------------------------------------------------------------- Orders   #  Description  Code         Ordered By   1  [place] MFM OB DETAIL +14 WK              [id number]     Mia Wilkinson  ----------------------------------------------------------------------   #  Order #                    Accession #                 Episode #   1  [id number]                  [id number]                  [id number]  ---------------------------------------------------------------------- Indications   Obesity complicating pregnancy, second         O99.212   trimester   Encounter for antenatal screening for          Z36.3   malformations   History of cesarean delivery, currently        O34.219   pregnant x5   [redacted] weeks gestation of pregnancy                Z3A.19  ---------------------------------------------------------------------- Vital Signs  Weight (lb): 186                               Height:        5'3"  BMI:         32.94 ---------------------------------------------------------------------- Fetal Evaluation  Num Of Fetuses:         1  Fetal Heart Rate(bpm):  159  Cardiac Activity:       Observed  Presentation:           Variable   Placenta:               Posterior  P. Cord Insertion:      Visualized, central  Amniotic Fluid  AFI FV:      Within normal limits                              Largest Pocket(cm)                              5.75 ---------------------------------------------------------------------- Biometry  BPD:      43.3  mm     G. Age:  19w 1d         49  %    CI:        76.54   %    70 - 86                                                          FL/HC:      19.3   %    16.1 - 18.3  HC:      156.8  mm     G. Age:  18w 4d         18  %    HC/AC:      1.11        1.09 - 1.39  AC:  140.8  mm     G. Age:  19w 3d         56  %    FL/BPD:     69.7   %  FL:       30.2  mm     G. Age:  19w 2d         50  %    FL/AC:      21.4   %    20 - 24  CER:      20.1  mm     G. Age:  19w 1d         49  %  LV:        5.8  mm  CM:        3.2  mm  Est. FW:     285  gm    0 lb 10 oz      55  % ---------------------------------------------------------------------- OB History  Gravidity:    6         Term:   5  Living:       5 ---------------------------------------------------------------------- Gestational Age  LMP:           19w 1d        Date:  07/01/19                 EDD:   04/06/20  U/S Today:     19w 1d                                        EDD:   04/06/20  Best:          19w 1d     Det. By:  LMP  (07/01/19)          EDD:   04/06/20 ---------------------------------------------------------------------- Anatomy  Cranium:               Appears normal         Aortic Arch:            Appears normal  Cavum:                 Appears normal         Ductal Arch:            Appears normal  Ventricles:            Appears normal         Diaphragm:              Appears normal  Choroid Plexus:        Appears normal         Stomach:                Appears normal, left                                                                        sided  Cerebellum:            Appears normal         Abdomen:  Appears normal  Posterior Fossa:        Appears normal         Abdominal Wall:         Appears nml (cord                                                                        insert, abd wall)  Nuchal Fold:           Appears normal         Cord Vessels:           Appears normal (3                                                                        vessel cord)  Face:                  Appears normal         Kidneys:                Appear normal                         (orbits and profile)  Lips:                  Appears normal         Bladder:                Appears normal  Thoracic:              Appears normal         Spine:                  Appears normal  Heart:                 Appears normal         Upper Extremities:      Appears normal                         (4CH, axis, and                         situs)  RVOT:                  Appears normal         Lower Extremities:      Appears normal  LVOT:                  Appears normal  Other:  Heels and 5th digit visualized. Nasal bone visualized. Open hands          visualized. ---------------------------------------------------------------------- Cervix Uterus Adnexa  Cervix  Length:           3.62  cm.  Normal appearance by transabdominal scan.  Uterus  No abnormality visualized.  Left Ovary  No adnexal mass visualized.  Right  Ovary  No adnexal mass visualized.  Cul De Sac  No free fluid seen.  Adnexa  No abnormality visualized. ---------------------------------------------------------------------- Comments  This patient was seen for a detailed fetal anatomy scan due  to maternal obesity. She denies any significant past medical  history and denies any problems in her current pregnancy.  She had a cell free DNA test earlier in her pregnancy which  indicated a low risk for trisomy 48, 40, and 13. A female fetus  is predicted.  She was informed that the fetal growth and amniotic fluid  level were appropriate for her gestational age.  There were no obvious fetal anomalies noted on today's  ultrasound  exam.  The patient was informed that anomalies may be missed due  to technical limitations. If the fetus is in a suboptimal position  or maternal habitus is increased, visualization of the fetus in  the maternal uterus may be impaired.  Follow up as indicated. ----------------------------------------------------------------------                   Mia Rings, MD Electronically Signed Final Report   11/12/2019 11:21 am ----------------------------------------------------------------------   Assessment and Plan:  Pregnancy: [id number] at [redacted]w[redacted]d 1. Supervision of high risk pregnancy, antepartum Third trimester labs with glucola next visit  2. History of cesarean delivery, currently pregnant Will be scheduled for repeat with BTL. Information sent to surgical scheduler to schedule at 39 weeks on 7/26  3. Maternal obesity affecting pregnancy, antepartum Continue ASA  4. Pregnancy headache in first trimester Refill on Fioricet and will follow up on referral to see headache speacialist - butalbital-acetaminophen-caffeine (FIORICET) 50-325-40 MG tablet; Take 1-2 tablets by mouth every 6 (six) hours as needed for headache.  Dispense: 20 tablet; Refill: 0   Preterm labor symptoms and general obstetric precautions including but not limited to vaginal bleeding, contractions, leaking of fluid and fetal movement were reviewed in detail with the patient. I discussed the assessment and treatment plan with the patient. The patient was provided an opportunity to ask questions and all were answered. The patient agreed with the plan and demonstrated an understanding of the instructions. The patient was advised to call back or seek an in-person office evaluation/go to [facility] at [facility] for any urgent or concerning symptoms. Please refer to After Visit Summary for other counseling recommendations.   I provided 11 minutes of face-to-face time during this encounter.  Return in about 4 weeks (around  01/08/2020) for in person, ROB, 2 hr glucola next visit.  Future Appointments  Date Time Provider Department Center  12/11/2019  4:15 PM Yulieth Carrender, Gigi Gin, MD [facility] None    Catalina Antigua, MD [facility] for [employer], [facility]

## 2019-12-11 NOTE — Progress Notes (Signed)
I connected with  Mia Wilkinson on 12/11/19 by a video enabled telemedicine application and verified that I am speaking with the correct person using two identifiers.   I discussed the limitations of evaluation and management by telemedicine. The patient expressed understanding and agreed to proceed.  MyChart ROB c/o Migraines and auroas 9/10 almost everyday, needs refills on Fioricet.  She is not at home and cannot check BP.

## 2019-12-11 NOTE — Telephone Encounter (Signed)
Informed patient of her next office visit and told her Herbert Seta from [facility] office will call her to get her scheduled.Marland KitchenMarland KitchenMarland Kitchen

## 2019-12-11 NOTE — Addendum Note (Signed)
Addended by: Catalina Antigua on: 12/11/2019 01:33 PM   Modules accepted: Orders

## 2020-01-07 ENCOUNTER — Other Ambulatory Visit: Payer: Self-pay | Admitting: Obstetrics & Gynecology

## 2020-01-07 NOTE — Progress Notes (Signed)
Orders for surgery 

## 2020-01-08 ENCOUNTER — Other Ambulatory Visit: Payer: Medicaid Other

## 2020-01-08 ENCOUNTER — Encounter: Payer: Self-pay | Admitting: Family Medicine

## 2020-01-08 ENCOUNTER — Ambulatory Visit (INDEPENDENT_AMBULATORY_CARE_PROVIDER_SITE_OTHER): Payer: Medicaid Other | Admitting: Family Medicine

## 2020-01-08 ENCOUNTER — Other Ambulatory Visit: Payer: Self-pay

## 2020-01-08 VITALS — BP 106/70 | HR 101 | Wt 192.5 lb

## 2020-01-08 DIAGNOSIS — O0990 Supervision of high risk pregnancy, unspecified, unspecified trimester: Secondary | ICD-10-CM

## 2020-01-08 DIAGNOSIS — R519 Headache, unspecified: Secondary | ICD-10-CM

## 2020-01-08 DIAGNOSIS — O26893 Other specified pregnancy related conditions, third trimester: Secondary | ICD-10-CM | POA: Insufficient documentation

## 2020-01-08 DIAGNOSIS — K439 Ventral hernia without obstruction or gangrene: Secondary | ICD-10-CM

## 2020-01-08 DIAGNOSIS — O99212 Obesity complicating pregnancy, second trimester: Secondary | ICD-10-CM

## 2020-01-08 DIAGNOSIS — Z3A27 27 weeks gestation of pregnancy: Secondary | ICD-10-CM

## 2020-01-08 DIAGNOSIS — O26892 Other specified pregnancy related conditions, second trimester: Secondary | ICD-10-CM

## 2020-01-08 DIAGNOSIS — O99210 Obesity complicating pregnancy, unspecified trimester: Secondary | ICD-10-CM

## 2020-01-08 DIAGNOSIS — O34219 Maternal care for unspecified type scar from previous cesarean delivery: Secondary | ICD-10-CM

## 2020-01-08 NOTE — Patient Instructions (Signed)

## 2020-01-08 NOTE — Progress Notes (Signed)
Subjective:  Mia Wilkinson is a 34 y.o. G6P5005 at [redacted]w[redacted]d being seen today for ongoing prenatal care.  She is currently monitored for the following issues for this high-risk pregnancy and has History of cesarean delivery, currently pregnant; Supervision of high risk pregnancy, antepartum; Hernia, ventral; Maternal obesity affecting pregnancy, antepartum; Frequent headaches; and Headache in pregnancy, antepartum, third trimester on their problem list.  Patient reports headache- still taking Fioricet, has been referred to headache specialist.  Contractions: Not present. Vag. Bleeding: None.  Movement: Present. Denies leaking of fluid.   The following portions of the patient's history were reviewed and updated as appropriate: allergies, current medications, past family history, past medical history, past social history, past surgical history and problem list. Problem list updated.  Objective:   Vitals:   01/08/20 0905  BP: 106/70  Pulse: (!) 101  Weight: 192 lb 8 oz (87.3 kg)    Fetal Status: Fetal Heart Rate (bpm): 154 Fundal Height: 27 cm Movement: Present     General:  Alert, oriented and cooperative. Patient is in no acute distress.  Skin: Skin is warm and dry. No rash noted.   Cardiovascular: Normal heart rate noted  Respiratory: Normal respiratory effort, no problems with respiration noted  Abdomen: Soft, gravid, appropriate for gestational age. Pain/Pressure: Present     Pelvic: Vag. Bleeding: None     Cervical exam deferred        Extremities: Normal range of motion.  Edema: Trace  Mental Status: Normal mood and affect. Normal behavior. Normal judgment and thought content.   Urinalysis:      Assessment and Plan:  Pregnancy: G6P5005 at [redacted]w[redacted]d  1. Supervision of high risk pregnancy, antepartum - Continue routine prenatal care - Glucose Tolerance, 2 Hours w/1 Hour - RPR - HIV Antibody (routine testing w rflx) - CBC  2. History of cesarean delivery, currently pregnant -  repeat CS with BTL scheduled for 39 weeks on 7/26 - BTL consent signed today  3. Maternal obesity affecting pregnancy, antepartum - continue ASA 81 mg  4. Ventral hernia without obstruction or gangrene - will be evaluated at time of CS  5. Headache in pregnancy, antepartum, third trimester - referred to headache specialist - continue Fioricet  Preterm labor symptoms and general obstetric precautions including but not limited to vaginal bleeding, contractions, leaking of fluid and fetal movement were reviewed in detail with the patient. Please refer to After Visit Summary for other counseling recommendations.  Return in about 2 weeks (around 01/22/2020) for [facility], virtual ok.   Marjory Meints L, DO

## 2020-01-08 NOTE — Progress Notes (Signed)
Patient reports fetal movement, complains of some back pain. Declined tdap.

## 2020-01-09 LAB — RPR: RPR Ser Ql: NONREACTIVE

## 2020-01-09 LAB — CBC
Hematocrit: 30.9 % — ABNORMAL LOW (ref 34.0–46.6)
Hemoglobin: 10 g/dL — ABNORMAL LOW (ref 11.1–15.9)
MCH: 25.8 pg — ABNORMAL LOW (ref 26.6–33.0)
MCHC: 32.4 g/dL (ref 31.5–35.7)
MCV: 80 fL (ref 79–97)
Platelets: 157 10*3/uL (ref 150–450)
RBC: 3.87 x10E6/uL (ref 3.77–5.28)
RDW: 14.7 % (ref 11.7–15.4)
WBC: 8.4 10*3/uL (ref 3.4–10.8)

## 2020-01-09 LAB — GLUCOSE TOLERANCE, 2 HOURS W/ 1HR
Glucose, 1 hour: 142 mg/dL (ref 65–179)
Glucose, 2 hour: 109 mg/dL (ref 65–152)
Glucose, Fasting: 82 mg/dL (ref 65–91)

## 2020-01-09 LAB — HIV ANTIBODY (ROUTINE TESTING W REFLEX): HIV Screen 4th Generation wRfx: NONREACTIVE

## 2020-01-22 ENCOUNTER — Telehealth (INDEPENDENT_AMBULATORY_CARE_PROVIDER_SITE_OTHER): Payer: Medicaid Other | Admitting: Obstetrics and Gynecology

## 2020-01-22 DIAGNOSIS — O99210 Obesity complicating pregnancy, unspecified trimester: Secondary | ICD-10-CM

## 2020-01-22 DIAGNOSIS — R519 Headache, unspecified: Secondary | ICD-10-CM

## 2020-01-22 NOTE — Progress Notes (Signed)
Virtual ROB   CC:    

## 2020-01-22 NOTE — Progress Notes (Signed)
Pt did not answer for her virtual OB appt. She will be called to reschedule.

## 2020-01-23 ENCOUNTER — Telehealth: Payer: Medicaid Other | Admitting: Obstetrics & Gynecology

## 2020-01-24 ENCOUNTER — Encounter: Payer: Self-pay | Admitting: Obstetrics and Gynecology

## 2020-01-24 ENCOUNTER — Telehealth (INDEPENDENT_AMBULATORY_CARE_PROVIDER_SITE_OTHER): Payer: Medicaid Other | Admitting: Obstetrics and Gynecology

## 2020-01-24 DIAGNOSIS — R519 Headache, unspecified: Secondary | ICD-10-CM

## 2020-01-24 DIAGNOSIS — O99210 Obesity complicating pregnancy, unspecified trimester: Secondary | ICD-10-CM

## 2020-01-24 DIAGNOSIS — O0990 Supervision of high risk pregnancy, unspecified, unspecified trimester: Secondary | ICD-10-CM

## 2020-01-24 DIAGNOSIS — O34219 Maternal care for unspecified type scar from previous cesarean delivery: Secondary | ICD-10-CM

## 2020-01-24 NOTE — Progress Notes (Signed)
Will check blood pressure this afternoon

## 2020-01-25 ENCOUNTER — Other Ambulatory Visit: Payer: Self-pay | Admitting: Family Medicine

## 2020-01-25 DIAGNOSIS — R42 Dizziness and giddiness: Secondary | ICD-10-CM | POA: Diagnosis not present

## 2020-01-25 DIAGNOSIS — B379 Candidiasis, unspecified: Secondary | ICD-10-CM

## 2020-01-25 DIAGNOSIS — G4489 Other headache syndrome: Secondary | ICD-10-CM | POA: Diagnosis not present

## 2020-01-27 ENCOUNTER — Other Ambulatory Visit: Payer: Self-pay | Admitting: *Deleted

## 2020-01-27 DIAGNOSIS — Z3490 Encounter for supervision of normal pregnancy, unspecified, unspecified trimester: Secondary | ICD-10-CM | POA: Diagnosis not present

## 2020-01-27 DIAGNOSIS — B379 Candidiasis, unspecified: Secondary | ICD-10-CM

## 2020-01-27 MED ORDER — TERCONAZOLE 0.4 % VA CREA: 1.0000 | TOPICAL_CREAM | Freq: Every day | VAGINAL | 0 refills | Status: DC

## 2020-01-27 MED ORDER — BLOOD PRESSURE KIT DEVI: 1.0000 | 0 refills | Status: DC | PRN

## 2020-01-27 NOTE — Progress Notes (Signed)
Pt called office for yeast tx. Terazol sent per protocol.  Pt also scheduled to be seen in office by provider. Pt states that she has passed out recently and has been feeling lightheaded at times. Pt states she is staying well hydrated and her BP was good when checked by EMS. Pt advised that if she has an episode before appt that she is to be seen at hospital.

## 2020-01-28 ENCOUNTER — Ambulatory Visit (INDEPENDENT_AMBULATORY_CARE_PROVIDER_SITE_OTHER): Payer: Medicaid Other | Admitting: Obstetrics & Gynecology

## 2020-01-28 ENCOUNTER — Other Ambulatory Visit: Payer: Self-pay

## 2020-01-28 VITALS — BP 115/75 | HR 105 | Wt 190.0 lb

## 2020-01-28 DIAGNOSIS — R55 Syncope and collapse: Secondary | ICD-10-CM | POA: Diagnosis not present

## 2020-01-28 DIAGNOSIS — O99013 Anemia complicating pregnancy, third trimester: Secondary | ICD-10-CM | POA: Diagnosis not present

## 2020-01-28 DIAGNOSIS — O0990 Supervision of high risk pregnancy, unspecified, unspecified trimester: Secondary | ICD-10-CM

## 2020-01-28 DIAGNOSIS — Z3A30 30 weeks gestation of pregnancy: Secondary | ICD-10-CM | POA: Diagnosis not present

## 2020-01-28 LAB — POCT URINALYSIS DIPSTICK
Bilirubin, UA: NEGATIVE
Blood, UA: NEGATIVE
Glucose, UA: POSITIVE — AB
Ketones, UA: NEGATIVE
Nitrite, UA: NEGATIVE
Protein, UA: POSITIVE — AB
Spec Grav, UA: >=1.03 — AB (ref 1.010–1.025)
Urobilinogen, UA: 0.2 E.U./dL
pH, UA: >=9 — AB (ref 5.0–8.0)

## 2020-01-28 LAB — POCT CBG (FASTING - GLUCOSE)-MANUAL ENTRY: Glucose Fasting, POC: 116 mg/dL — AB (ref 70–99)

## 2020-01-28 MED ORDER — NITROFURANTOIN MONOHYD MACRO 100 MG PO CAPS
100.0000 mg | ORAL_CAPSULE | Freq: Two times a day (BID) | ORAL | 0 refills | Status: AC
Start: 2020-01-28 — End: 2020-02-04

## 2020-01-28 NOTE — Progress Notes (Signed)
Subjective:    Mia Wilkinson is a 34 y.o. [phone] [redacted]w[redacted]d being seen today for her obstetrical visit.  Patient reports fatigue, headache and persistent lightheadedness, floaters in vision, and fainting episodes.  She fainted while driving recently.  PT has occaisional HA , relieved w/ fioricet. Denies scotoma, RUQ pain. . Fetal movement: normal.  Objective:    BP 115/75   Pulse (!) 105   Wt 86.2 kg   LMP 07/01/2019   BMI 33.66 kg/m   Physical Exam  Vitals reviewed. Constitutional: She is oriented to person, place, and time. She appears well-developed and well-nourished.  HENT:  Head: Normocephalic and atraumatic.  Eyes: Pupils are equal, round, and reactive to light.  Cardiovascular: Normal rate.  Respiratory: Effort normal.  GI: Soft. She exhibits no distension. There is no abdominal tenderness. There is no guarding.  Musculoskeletal:        General: No edema.     Cervical back: Normal range of motion.  Neurological: She is alert and oriented to person, place, and time.  Skin: Skin is warm and dry.  Psychiatric: She has a normal mood and affect. Her behavior is normal.    Maternal Exam:  Uterine Assessment: Contraction strength is mild.  Abdomen: Patient reports no abdominal tenderness. Surgical scars: low transverse.      FHT: Fetal Heart Rate (bpm): 148  Uterine Size:  30 cm  Presentation:       Assessment:    Pregnancy:  G6P5005 at 30wks here with persistent lightheadedness and fainting episodes.     Plan:    Patient Active Problem List   Diagnosis Date Noted  . Headache in pregnancy, antepartum, third trimester 01/08/2020  . Maternal obesity affecting pregnancy, antepartum 11/14/2019  . Frequent headaches 11/14/2019  . Supervision of high risk pregnancy, antepartum 09/09/2019  . Hernia, ventral 09/09/2019  . History of cesarean delivery, currently pregnant 12/18/2013    Blood glucose, wnl, Hg 10 01/08/20, repeat today, pt on iron, may benefit from IV iron if  continued decreased. EKG order to r/o prolonged QT, Cards consulted. Reinfored continued adequate H2O intake. Questions answered.  Follow up in 2 Weeks.

## 2020-01-28 NOTE — Progress Notes (Signed)
Patient reports fetal movement with some cramping, denies bleeding. Pt states that she has been having dizzy/fainting spells since becoming pregnant an it is getting worse. Pt states that she fainted on Saturday and feels like she may again today.

## 2020-01-28 NOTE — Patient Instructions (Signed)
Third Trimester of Pregnancy The third trimester is from week 28 through week 40 (months 7 through 9). The third trimester is a time when the unborn baby (fetus) is growing rapidly. At the end of the ninth month, the fetus is about 20 inches in length and weighs 6-10 pounds. Body changes during your third trimester Your body will continue to go through many changes during pregnancy. The changes vary from woman to woman. During the third trimester:  Your weight will continue to increase. You can expect to gain 25-35 pounds (11-16 kg) by the end of the pregnancy.  You may begin to get stretch marks on your hips, abdomen, and breasts.  You may urinate more often because the fetus is moving lower into your pelvis and pressing on your bladder.  You may develop or continue to have heartburn. This is caused by increased hormones that slow down muscles in the digestive tract.  You may develop or continue to have constipation because increased hormones slow digestion and cause the muscles that push waste through your intestines to relax.  You may develop hemorrhoids. These are swollen veins (varicose veins) in the rectum that can itch or be painful.  You may develop swollen, bulging veins (varicose veins) in your legs.  You may have increased body aches in the pelvis, back, or thighs. This is due to weight gain and increased hormones that are relaxing your joints.  You may have changes in your hair. These can include thickening of your hair, rapid growth, and changes in texture. Some women also have hair loss during or after pregnancy, or hair that feels dry or thin. Your hair will most likely return to normal after your baby is born.  Your breasts will continue to grow and they will continue to become tender. A yellow fluid (colostrum) may leak from your breasts. This is the first milk you are producing for your baby.  Your belly button may stick out.  You may notice more swelling in your hands,  face, or ankles.  You may have increased tingling or numbness in your hands, arms, and legs. The skin on your belly may also feel numb.  You may feel short of breath because of your expanding uterus.  You may have more problems sleeping. This can be caused by the size of your belly, increased need to urinate, and an increase in your body's metabolism.  You may notice the fetus "dropping," or moving lower in your abdomen (lightening).  You may have increased vaginal discharge.  You may notice your joints feel loose and you may have pain around your pelvic bone. What to expect at prenatal visits You will have prenatal exams every 2 weeks until week 36. Then you will have weekly prenatal exams. During a routine prenatal visit:  You will be weighed to make sure you and the baby are growing normally.  Your blood pressure will be taken.  Your abdomen will be measured to track your baby's growth.  The fetal heartbeat will be listened to.  Any test results from the previous visit will be discussed.  You may have a cervical check near your due date to see if your cervix has softened or thinned (effaced).  You will be tested for Group B streptococcus. This happens between 35 and 37 weeks. Your health care provider may ask you:  What your birth plan is.  How you are feeling.  If you are feeling the baby move.  If you have had any abnormal   symptoms, such as leaking fluid, bleeding, severe headaches, or abdominal cramping.  If you are using any tobacco products, including cigarettes, chewing tobacco, and electronic cigarettes.  If you have any questions. Other tests or screenings that may be performed during your third trimester include:  Blood tests that check for low iron levels (anemia).  Fetal testing to check the health, activity level, and growth of the fetus. Testing is done if you have certain medical conditions or if there are problems during the pregnancy.  Nonstress test  (NST). This test checks the health of your baby to make sure there are no signs of problems, such as the baby not getting enough oxygen. During this test, a belt is placed around your belly. The baby is made to move, and its heart rate is monitored during movement. What is false labor? False labor is a condition in which you feel small, irregular tightenings of the muscles in the womb (contractions) that usually go away with rest, changing position, or drinking water. These are called Braxton Hicks contractions. Contractions may last for hours, days, or even weeks before true labor sets in. If contractions come at regular intervals, become more frequent, increase in intensity, or become painful, you should see your health care provider. What are the signs of labor?  Abdominal cramps.  Regular contractions that start at 10 minutes apart and become stronger and more frequent with time.  Contractions that start on the top of the uterus and spread down to the lower abdomen and back.  Increased pelvic pressure and dull back pain.  A watery or bloody mucus discharge that comes from the vagina.  Leaking of amniotic fluid. This is also known as your "water breaking." It could be a slow trickle or a gush. Let your health care provider know if it has a color or strange odor. If you have any of these signs, call your health care provider right away, even if it is before your due date. Follow these instructions at home: Medicines  Follow your health care provider's instructions regarding medicine use. Specific medicines may be either safe or unsafe to take during pregnancy.  Take a prenatal vitamin that contains at least 600 micrograms (mcg) of folic acid.  If you develop constipation, try taking a stool softener if your health care provider approves. Eating and drinking   Eat a balanced diet that includes fresh fruits and vegetables, whole grains, good sources of protein such as meat, eggs, or tofu,  and low-fat dairy. Your health care provider will help you determine the amount of weight gain that is right for you.  Avoid raw meat and uncooked cheese. These carry germs that can cause birth defects in the baby.  If you have low calcium intake from food, talk to your health care provider about whether you should take a daily calcium supplement.  Eat four or five small meals rather than three large meals a day.  Limit foods that are high in fat and processed sugars, such as fried and sweet foods.  To prevent constipation: ? Drink enough fluid to keep your urine clear or pale yellow. ? Eat foods that are high in fiber, such as fresh fruits and vegetables, whole grains, and beans. Activity  Exercise only as directed by your health care provider. Most women can continue their usual exercise routine during pregnancy. Try to exercise for 30 minutes at least 5 days a week. Stop exercising if you experience uterine contractions.  Avoid heavy lifting.  Do   not exercise in extreme heat or humidity, or at high altitudes.  Wear low-heel, comfortable shoes.  Practice good posture.  You may continue to have sex unless your health care provider tells you otherwise. Relieving pain and discomfort  Take frequent breaks and rest with your legs elevated if you have leg cramps or low back pain.  Take warm sitz baths to soothe any pain or discomfort caused by hemorrhoids. Use hemorrhoid cream if your health care provider approves.  Wear a good support bra to prevent discomfort from breast tenderness.  If you develop varicose veins: ? Wear support pantyhose or compression stockings as told by your healthcare provider. ? Elevate your feet for 15 minutes, 3-4 times a day. Prenatal care  Write down your questions. Take them to your prenatal visits.  Keep all your prenatal visits as told by your health care provider. This is important. Safety  Wear your seat belt at all times when driving.  Make  a list of emergency phone numbers, including numbers for family, friends, the hospital, and police and fire departments. General instructions  Avoid cat litter boxes and soil used by cats. These carry germs that can cause birth defects in the baby. If you have a cat, ask someone to clean the litter box for you.  Do not travel far distances unless it is absolutely necessary and only with the approval of your health care provider.  Do not use hot tubs, steam rooms, or saunas.  Do not drink alcohol.  Do not use any products that contain nicotine or tobacco, such as cigarettes and e-cigarettes. If you need help quitting, ask your health care provider.  Do not use any medicinal herbs or unprescribed drugs. These chemicals affect the formation and growth of the baby.  Do not douche or use tampons or scented sanitary pads.  Do not cross your legs for long periods of time.  To prepare for the arrival of your baby: ? Take prenatal classes to understand, practice, and ask questions about labor and delivery. ? Make a trial run to the hospital. ? Visit the hospital and tour the maternity area. ? Arrange for maternity or paternity leave through employers. ? Arrange for family and friends to take care of pets while you are in the hospital. ? Purchase a rear-facing car seat and make sure you know how to install it in your car. ? Pack your hospital bag. ? Prepare the baby's nursery. Make sure to remove all pillows and stuffed animals from the baby's crib to prevent suffocation.  Visit your dentist if you have not gone during your pregnancy. Use a soft toothbrush to brush your teeth and be gentle when you floss. Contact a health care provider if:  You are unsure if you are in labor or if your water has broken.  You become dizzy.  You have mild pelvic cramps, pelvic pressure, or nagging pain in your abdominal area.  You have lower back pain.  You have persistent nausea, vomiting, or  diarrhea.  You have an unusual or bad smelling vaginal discharge.  You have pain when you urinate. Get help right away if:  Your water breaks before 37 weeks.  You have regular contractions less than 5 minutes apart before 37 weeks.  You have a fever.  You are leaking fluid from your vagina.  You have spotting or bleeding from your vagina.  You have severe abdominal pain or cramping.  You have rapid weight loss or weight gain.  You have   shortness of breath with chest pain.  You notice sudden or extreme swelling of your face, hands, ankles, feet, or legs.  Your baby makes fewer than 10 movements in 2 hours.  You have severe headaches that do not go away when you take medicine.  You have vision changes. Summary  The third trimester is from week 28 through week 40, months 7 through 9. The third trimester is a time when the unborn baby (fetus) is growing rapidly.  During the third trimester, your discomfort may increase as you and your baby continue to gain weight. You may have abdominal, leg, and back pain, sleeping problems, and an increased need to urinate.  During the third trimester your breasts will keep growing and they will continue to become tender. A yellow fluid (colostrum) may leak from your breasts. This is the first milk you are producing for your baby.  False labor is a condition in which you feel small, irregular tightenings of the muscles in the womb (contractions) that eventually go away. These are called Braxton Hicks contractions. Contractions may last for hours, days, or even weeks before true labor sets in.  Signs of labor can include: abdominal cramps; regular contractions that start at 10 minutes apart and become stronger and more frequent with time; watery or bloody mucus discharge that comes from the vagina; increased pelvic pressure and dull back pain; and leaking of amniotic fluid. This information is not intended to replace advice given to you by your  health care provider. Make sure you discuss any questions you have with your health care provider. Document Revised: 12/13/2018 Document Reviewed: 09/27/2016 Elsevier Patient Education  2020 [employer]  

## 2020-01-28 NOTE — Addendum Note (Signed)
Addended by: Thom Chimes on: 01/28/2020 10:13 AM   Modules accepted: Orders

## 2020-01-29 LAB — PROTEIN / CREATININE RATIO, URINE
Creatinine, Urine: 107.4 mg/dL
Protein, Ur: 40.6 mg/dL
Protein/Creat Ratio: 378 mg/g creat — ABNORMAL HIGH (ref 0–200)

## 2020-01-30 LAB — URINE CULTURE

## 2020-02-06 ENCOUNTER — Telehealth: Payer: Medicaid Other | Admitting: Obstetrics and Gynecology

## 2020-02-07 ENCOUNTER — Ambulatory Visit: Payer: Medicaid Other | Admitting: Cardiovascular Disease

## 2020-02-11 ENCOUNTER — Telehealth (INDEPENDENT_AMBULATORY_CARE_PROVIDER_SITE_OTHER): Payer: Medicaid Other | Admitting: Obstetrics and Gynecology

## 2020-02-11 ENCOUNTER — Encounter: Payer: Self-pay | Admitting: Obstetrics and Gynecology

## 2020-02-11 DIAGNOSIS — O0990 Supervision of high risk pregnancy, unspecified, unspecified trimester: Secondary | ICD-10-CM

## 2020-02-11 DIAGNOSIS — Z3009 Encounter for other general counseling and advice on contraception: Secondary | ICD-10-CM | POA: Insufficient documentation

## 2020-02-11 DIAGNOSIS — O99213 Obesity complicating pregnancy, third trimester: Secondary | ICD-10-CM

## 2020-02-11 DIAGNOSIS — Z3A32 32 weeks gestation of pregnancy: Secondary | ICD-10-CM

## 2020-02-11 DIAGNOSIS — O99210 Obesity complicating pregnancy, unspecified trimester: Secondary | ICD-10-CM

## 2020-02-11 DIAGNOSIS — O0993 Supervision of high risk pregnancy, unspecified, third trimester: Secondary | ICD-10-CM

## 2020-02-11 DIAGNOSIS — E669 Obesity, unspecified: Secondary | ICD-10-CM

## 2020-02-11 DIAGNOSIS — O34219 Maternal care for unspecified type scar from previous cesarean delivery: Secondary | ICD-10-CM

## 2020-02-11 MED ORDER — BUTALBITAL-APAP-CAFFEINE 50-325-40 MG PO CAPS: 1.0000 | ORAL_CAPSULE | Freq: Four times a day (QID) | ORAL | 1 refills | Status: DC | PRN

## 2020-02-11 NOTE — Progress Notes (Signed)
Pt states she is having a lot of back pain and some cramping.  Pt states she is still having headaches- out of medication. Pt states she is still feeling like she will black out -  Pt states the usually follows her headaches.   Pt states that she is not able to check her BP at this time.

## 2020-02-11 NOTE — Progress Notes (Signed)
   OBSTETRICS PRENATAL VIRTUAL VISIT ENCOUNTER NOTE  Provider location: [facility] for [facility] [place]   I connected with Mia Wilkinson on 02/11/20 at  1:15 PM EDT by MyChart Video Encounter at home and verified that I am speaking with the correct person using two identifiers.   I discussed the limitations, risks, security and privacy concerns of performing an evaluation and management service virtually and the availability of in person appointments. I also discussed with the patient that there may be a patient responsible charge related to this service. The patient expressed understanding and agreed to proceed. Subjective:  Mia Wilkinson is a 34 y.o. G6P5005 at [redacted]w[redacted]d being seen today for ongoing prenatal care.  She is currently monitored for the following issues for this high-risk pregnancy and has History of cesarean delivery, currently pregnant; Supervision of high risk pregnancy, antepartum; Hernia, ventral; Maternal obesity affecting pregnancy, antepartum; Frequent headaches; Headache in pregnancy, antepartum, third trimester; and Unwanted fertility on their problem list.  Patient reports mild cramping and back pain. Ongoing headaches.   Contractions: Irritability. Vag. Bleeding: None.  Movement: Present. Denies any leaking of fluid.   The following portions of the patient's history were reviewed and updated as appropriate: allergies, current medications, past family history, past medical history, past social history, past surgical history and problem list.   Objective:  There were no vitals filed for this visit.  Fetal Status:     Movement: Present     General:  Alert, oriented and cooperative. Patient is in no acute distress.  Respiratory: Normal respiratory effort, no problems with respiration noted  Mental Status: Normal mood and affect. Normal behavior. Normal judgment and thought content.  Rest of physical exam deferred due to type of encounter  Imaging: No results  found.  Assessment and Plan:  Pregnancy: G6P5005 at [redacted]w[redacted]d  1. Maternal obesity affecting pregnancy, antepartum  2. Supervision of high risk pregnancy, antepartum Pt requesting first appt of the day for CS (currently scheduled for 12 pm), I sent request to scheduler to have appt moved up if possible but that it depends on OR schedule, reviewed we cannot move CS up before 39 weeks unless there is an issue and needs to be done sooner - fioricet sent for headache  3. History of cesarean delivery, currently pregnant RCS scheduled for 7/26  4. Unwanted fertility For BTL   Preterm labor symptoms and general obstetric precautions including but not limited to vaginal bleeding, contractions, leaking of fluid and fetal movement were reviewed in detail with the patient. I discussed the assessment and treatment plan with the patient. The patient was provided an opportunity to ask questions and all were answered. The patient agreed with the plan and demonstrated an understanding of the instructions. The patient was advised to call back or seek an in-person office evaluation/go to [facility] at [facility] for any urgent or concerning symptoms. Please refer to After Visit Summary for other counseling recommendations.   I provided 15 minutes of face-to-face time during this encounter.  Return in about 2 weeks (around 02/25/2020) for high OB, in person.  No future appointments.  Conan Bowens, MD Center for [facility], [facility]

## 2020-02-24 ENCOUNTER — Encounter (HOSPITAL_COMMUNITY): Payer: Self-pay | Admitting: Emergency Medicine

## 2020-02-24 ENCOUNTER — Emergency Department (HOSPITAL_COMMUNITY)
Admission: EM | Admit: 2020-02-24 | Discharge: 2020-02-24 | Disposition: A | Discharge status: Home / Self Care | Payer: Medicaid Other | Attending: Emergency Medicine | Admitting: Emergency Medicine

## 2020-02-24 ENCOUNTER — Other Ambulatory Visit: Payer: Self-pay

## 2020-02-24 ENCOUNTER — Emergency Department (HOSPITAL_COMMUNITY): Payer: Medicaid Other

## 2020-02-24 DIAGNOSIS — Z7982 Long term (current) use of aspirin: Secondary | ICD-10-CM | POA: Insufficient documentation

## 2020-02-24 DIAGNOSIS — O99891 Other specified diseases and conditions complicating pregnancy: Secondary | ICD-10-CM | POA: Insufficient documentation

## 2020-02-24 DIAGNOSIS — O99283 Endocrine, nutritional and metabolic diseases complicating pregnancy, third trimester: Secondary | ICD-10-CM | POA: Diagnosis not present

## 2020-02-24 DIAGNOSIS — R0602 Shortness of breath: Secondary | ICD-10-CM | POA: Insufficient documentation

## 2020-02-24 DIAGNOSIS — R42 Dizziness and giddiness: Secondary | ICD-10-CM | POA: Diagnosis not present

## 2020-02-24 DIAGNOSIS — Z79899 Other long term (current) drug therapy: Secondary | ICD-10-CM | POA: Diagnosis not present

## 2020-02-24 DIAGNOSIS — E876 Hypokalemia: Secondary | ICD-10-CM | POA: Diagnosis not present

## 2020-02-24 DIAGNOSIS — R55 Syncope and collapse: Secondary | ICD-10-CM | POA: Diagnosis not present

## 2020-02-24 DIAGNOSIS — G4489 Other headache syndrome: Secondary | ICD-10-CM | POA: Diagnosis not present

## 2020-02-24 DIAGNOSIS — R000 Tachycardia, unspecified: Secondary | ICD-10-CM | POA: Diagnosis not present

## 2020-02-24 DIAGNOSIS — Z3A35 35 weeks gestation of pregnancy: Secondary | ICD-10-CM | POA: Diagnosis not present

## 2020-02-24 DIAGNOSIS — I471 Supraventricular tachycardia: Secondary | ICD-10-CM | POA: Diagnosis not present

## 2020-02-24 LAB — URINALYSIS, ROUTINE W REFLEX MICROSCOPIC
Bilirubin Urine: NEGATIVE
Glucose, UA: NEGATIVE mg/dL
Ketones, ur: NEGATIVE mg/dL
Leukocytes,Ua: NEGATIVE
Nitrite: NEGATIVE
Protein, ur: NEGATIVE mg/dL
Specific Gravity, Urine: 1.009 (ref 1.005–1.030)
pH: 6 (ref 5.0–8.0)

## 2020-02-24 LAB — CBC WITH DIFFERENTIAL/PLATELET
Abs Immature Granulocytes: 0.03 10*3/uL (ref 0.00–0.07)
Basophils Absolute: 0 10*3/uL (ref 0.0–0.1)
Basophils Relative: 0 %
Eosinophils Absolute: 0 10*3/uL (ref 0.0–0.5)
Eosinophils Relative: 0 %
HCT: 35.7 % — ABNORMAL LOW (ref 36.0–46.0)
Hemoglobin: 10.7 g/dL — ABNORMAL LOW (ref 12.0–15.0)
Immature Granulocytes: 0 %
Lymphocytes Relative: 10 %
Lymphs Abs: 0.9 10*3/uL (ref 0.7–4.0)
MCH: 25.4 pg — ABNORMAL LOW (ref 26.0–34.0)
MCHC: 30 g/dL (ref 30.0–36.0)
MCV: 84.6 fL (ref 80.0–100.0)
Monocytes Absolute: 0.5 10*3/uL (ref 0.1–1.0)
Monocytes Relative: 5 %
Neutro Abs: 7.6 10*3/uL (ref 1.7–7.7)
Neutrophils Relative %: 85 %
Platelets: 158 10*3/uL (ref 150–400)
RBC: 4.22 MIL/uL (ref 3.87–5.11)
RDW: 17.1 % — ABNORMAL HIGH (ref 11.5–15.5)
WBC: 9.1 10*3/uL (ref 4.0–10.5)
nRBC: 0 % (ref 0.0–0.2)

## 2020-02-24 LAB — TROPONIN I (HIGH SENSITIVITY)
Troponin I (High Sensitivity): <2 ng/L (ref <18)
Troponin I (High Sensitivity): <2 ng/L (ref <18)

## 2020-02-24 LAB — BASIC METABOLIC PANEL
Anion gap: 10 (ref 5–15)
BUN: 6 mg/dL (ref 6–20)
CO2: 21 mmol/L — ABNORMAL LOW (ref 22–32)
Calcium: 8.3 mg/dL — ABNORMAL LOW (ref 8.9–10.3)
Chloride: 105 mmol/L (ref 98–111)
Creatinine, Ser: 0.54 mg/dL (ref 0.44–1.00)
GFR calc Af Amer: >60 mL/min (ref >60)
GFR calc non Af Amer: >60 mL/min (ref >60)
Glucose, Bld: 102 mg/dL — ABNORMAL HIGH (ref 70–99)
Potassium: 2.9 mmol/L — ABNORMAL LOW (ref 3.5–5.1)
Sodium: 136 mmol/L (ref 135–145)

## 2020-02-24 LAB — MAGNESIUM: Magnesium: 1.8 mg/dL (ref 1.7–2.4)

## 2020-02-24 LAB — TSH: TSH: 1.557 u[IU]/mL (ref 0.350–4.500)

## 2020-02-24 MED ORDER — ACETAMINOPHEN 500 MG PO TABS
1000.0000 mg | ORAL_TABLET | Freq: Once | ORAL | Status: DC
  Administered 2020-02-24: 1000 mg via ORAL
  Filled 2020-02-24: qty 2

## 2020-02-24 MED ORDER — DIPHENHYDRAMINE HCL 50 MG/ML IJ SOLN
25.0000 mg | Freq: Once | INTRAMUSCULAR | Status: AC
  Administered 2020-02-24: 25 mg via INTRAVENOUS
  Filled 2020-02-24 (×2): qty 1

## 2020-02-24 MED ORDER — HEPARIN SODIUM (PORCINE) 5000 UNIT/ML IJ SOLN: 4000.0000 [IU] | Freq: Once | INTRAMUSCULAR | Status: DC

## 2020-02-24 MED ORDER — METOCLOPRAMIDE HCL 5 MG/ML IJ SOLN
10.0000 mg | Freq: Once | INTRAMUSCULAR | Status: AC
  Administered 2020-02-24: 10 mg via INTRAVENOUS
  Filled 2020-02-24 (×2): qty 2

## 2020-02-24 MED ORDER — SODIUM CHLORIDE 0.9 % IV BOLUS
1000.0000 mL | Freq: Once | INTRAVENOUS | Status: AC
  Administered 2020-02-24: 1000 mL via INTRAVENOUS

## 2020-02-24 MED ORDER — POTASSIUM CHLORIDE CRYS ER 20 MEQ PO TBCR
40.0000 meq | EXTENDED_RELEASE_TABLET | Freq: Once | ORAL | Status: AC
  Administered 2020-02-24: 40 meq via ORAL
  Filled 2020-02-24: qty 2

## 2020-02-24 MED ORDER — SODIUM CHLORIDE 0.9 % IV BOLUS: 1000.0000 mL | Freq: Once | INTRAVENOUS | Status: DC

## 2020-02-24 MED ORDER — SODIUM CHLORIDE 0.9 % IV BOLUS
500.0000 mL | Freq: Once | INTRAVENOUS | Status: AC
  Administered 2020-02-24: 500 mL via INTRAVENOUS

## 2020-02-24 MED ORDER — NITROGLYCERIN IN D5W 200-5 MCG/ML-% IV SOLN: 0.0000 ug/min | INTRAVENOUS | Status: DC

## 2020-02-24 MED ORDER — POTASSIUM CHLORIDE ER 10 MEQ PO TBCR
10.0000 meq | EXTENDED_RELEASE_TABLET | Freq: Every day | ORAL | 0 refills | Status: DC
Start: 2020-02-24 — End: 2020-03-15

## 2020-02-24 MED ORDER — POTASSIUM CHLORIDE 10 MEQ/100ML IV SOLN
10.0000 meq | Freq: Once | INTRAVENOUS | Status: AC
  Administered 2020-02-24: 10 meq via INTRAVENOUS
  Filled 2020-02-24: qty 100

## 2020-02-24 MED ORDER — ACETAMINOPHEN 325 MG PO TABS: 650.0000 mg | ORAL_TABLET | Freq: Once | ORAL | Status: DC

## 2020-02-24 MED ORDER — BUTALBITAL-APAP-CAFFEINE 50-325-40 MG PO TABS: 1.0000 | ORAL_TABLET | Freq: Once | ORAL | Status: DC

## 2020-02-24 NOTE — ED Provider Notes (Signed)
[facility] EMERGENCY DEPARTMENT Provider Note   CSN: [id number] Arrival date & time: 02/24/20  1232     History Chief Complaint  Patient presents with  . Dizziness    Mia Wilkinson is a 34 y.o. female.  HPI   Patient is a 34 year old female with a history of anemia, GERD, who is currently [redacted] weeks pregnant who presents to the emergency department today for evaluation of an episode of lightheadedness. Denies dizziness or vertiginous sxs.  States she has had intermittent episodes of dizziness throughout her entire pregnancy and today she had an episode while she was sitting at her desk at work.  The episode improved somewhat however when she was being loaded into the ambulance it happened again.  Per EMS, they state they took the patient's pulse at that time and it was noted to be in the 150s, they tried to hook her up to the machine and it looks like she was in SVT however she converted before they were able to document a recording of this.  She states that she felt palpitations during both episodes today.  She denies any chest pain.  She did have a little bit of shortness of breath that she has had throughout this entire pregnancy, this is unchanged today. She has no pleuritic pain.  She had no cough or other URI symptoms.  She denies any fevers.  Denies any pain with inspiration.  She has been having migraines throughout this pregnancy and has been given Fioricet.  She took that earlier this morning.  She denies any neurologic deficits.  Past Medical History:  Diagnosis Date  . Anemia   . GERD (gastroesophageal reflux disease)    with pregnancy    Patient Active Problem List   Diagnosis Date Noted  . Unwanted fertility 02/11/2020  . Headache in pregnancy, antepartum, third trimester 01/08/2020  . Maternal obesity affecting pregnancy, antepartum 11/14/2019  . Frequent headaches 11/14/2019  . Supervision of high risk pregnancy, antepartum 09/09/2019  . Hernia,  ventral 09/09/2019  . History of cesarean delivery, currently pregnant 12/18/2013    Past Surgical History:  Procedure Laterality Date  . CESAREAN SECTION     x 3  . CESAREAN SECTION N/A 12/18/2013   Procedure: Repeat CESAREAN SECTION;  Surgeon: Frederico Hamman, MD;  Location: [facility];  Service: Obstetrics;  Laterality: N/A;  . CESAREAN SECTION N/A 01/13/2015   Procedure: REPEAT CESAREAN SECTION;  Surgeon: Frederico Hamman, MD;  Location: [facility];  Service: Obstetrics;  Laterality: N/A;  . LIPOSUCTION  05/02/2019     OB History    Gravida  6   Para  5   Term  5   Preterm  0   AB  0   Living  5     SAB  0   TAB  0   Ectopic  0   Multiple  0   Live Births  5           Family History  Problem Relation Age of Onset  . Hypertension Mother     Social History   Tobacco Use  . Smoking status: Never Smoker  . Smokeless tobacco: Never Used  Vaping Use  . Vaping Use: Never used  Substance Use Topics  . Alcohol use: No  . Drug use: No    Home Medications Prior to Admission medications   Medication Sig Start Date End Date Taking? Authorizing Provider  aspirin EC 81 MG tablet Take 1 tablet (  81 mg total) by mouth daily. Take after 12 weeks for prevention of preeclampsia later in pregnancy 11/14/19  Yes Constant, Peggy, MD  Blood Pressure Monitoring (BLOOD PRESSURE KIT) DEVI 1 Device by Does not apply route as needed. 01/27/20  Yes Anyanwu, Sallyanne Havers, MD  Butalbital-APAP-Caffeine 50-325-40 MG capsule Take 1-2 capsules by mouth every 6 (six) hours as needed for headache. 02/11/20  Yes Sloan Leiter, MD  FERROUSUL 325 (65 Fe) MG tablet Take 1 tablet (325 mg total) by mouth every other day. 09/20/19 02/24/20 Yes Sparacino, Hailey L, DO  Prenatal MV-Min-FA-Omega-3 (PRENATAL GUMMIES/DHA & FA) 0.4-32.5 MG CHEW Chew 1 tablet by mouth daily.    Yes [provider]  potassium chloride (KLOR-CON) 10 MEQ tablet Take 1 tablet (10 mEq total) by mouth daily for 3 days.  02/24/20 02/27/20  Jamy Whyte S, PA-C  terconazole (TERAZOL 7) 0.4 % vaginal cream Place 1 applicator vaginally at bedtime. Patient not taking: Reported on 01/28/2020 01/27/20   Osborne Oman, MD    Allergies    Patient has no known allergies.  Review of Systems   Review of Systems  Constitutional: Negative for fever.  HENT: Negative for ear pain and sore throat.   Eyes: Negative for visual disturbance.  Respiratory: Positive for shortness of breath (chronic throughout this pregnancy). Negative for cough.   Cardiovascular: Positive for palpitations. Negative for chest pain and leg swelling.  Gastrointestinal: Negative for abdominal pain, constipation, diarrhea, nausea and vomiting.  Genitourinary: Negative for dysuria and hematuria.  Musculoskeletal: Negative for back pain.  Skin: Negative for rash.  Neurological: Positive for light-headedness. Negative for headaches.  All other systems reviewed and are negative.   Physical Exam Updated Vital Signs BP 118/74   Pulse 88   Temp 98.2 F (36.8 C) (Oral)   Resp 20   Ht 5' 3"  (1.6 m)   Wt 86.2 kg   LMP 07/01/2019   SpO2 97%   BMI 33.66 kg/m   Physical Exam Vitals and nursing note reviewed.  Constitutional:      General: She is not in acute distress.    Appearance: She is well-developed.  HENT:     Head: Normocephalic and atraumatic.  Eyes:     Conjunctiva/sclera: Conjunctivae normal.  Cardiovascular:     Rate and Rhythm: Normal rate and regular rhythm.     Pulses: Normal pulses.     Heart sounds: Normal heart sounds. No murmur heard.   Pulmonary:     Effort: Pulmonary effort is normal. No respiratory distress.     Breath sounds: Normal breath sounds. No wheezing, rhonchi or rales.  Abdominal:     General: Bowel sounds are normal.     Palpations: Abdomen is soft.     Tenderness: There is no abdominal tenderness. There is no guarding or rebound.  Musculoskeletal:     Cervical back: Neck supple.  Skin:     General: Skin is warm and dry.  Neurological:     Mental Status: She is alert.     Comments: Mental Status:  Alert, thought content appropriate, able to give a coherent history. Speech fluent without evidence of aphasia. Able to follow 2 step commands without difficulty.  Cranial Nerves:  II:  pupils equal, round, reactive to light III,IV, VI: ptosis not present, extra-ocular motions intact bilaterally  V,VII: smile symmetric, facial light touch sensation equal VIII: hearing grossly normal to voice  X: uvula elevates symmetrically  XI: bilateral shoulder shrug symmetric and strong XII: midline  tongue extension without fassiculations Motor:  Normal tone. 5/5 strength of BUE and BLE major muscle groups including strong and equal grip strength and dorsiflexion/plantar flexion Sensory: light touch normal in all extremities.      ED Results / Procedures / Treatments   Labs (all labs ordered are listed, but only abnormal results are displayed) Labs Reviewed  CBC WITH DIFFERENTIAL/PLATELET - Abnormal; Notable for the following components:      Result Value   Hemoglobin 10.7 (*)    HCT 35.7 (*)    MCH 25.4 (*)    RDW 17.1 (*)    All other components within normal limits  BASIC METABOLIC PANEL - Abnormal; Notable for the following components:   Potassium 2.9 (*)    CO2 21 (*)    Glucose, Bld 102 (*)    Calcium 8.3 (*)    All other components within normal limits  URINALYSIS, ROUTINE W REFLEX MICROSCOPIC - Abnormal; Notable for the following components:   Hgb urine dipstick SMALL (*)    Bacteria, UA RARE (*)    All other components within normal limits  TSH  MAGNESIUM  TROPONIN I (HIGH SENSITIVITY)  TROPONIN I (HIGH SENSITIVITY)    EKG EKG Interpretation  Date/Time:  Monday February 24 2020 12:40:07 EDT Ventricular Rate:  99 PR Interval:    QRS Duration: 91 QT Interval:  351 QTC Calculation: 451 R Axis:   82 Text Interpretation: Sinus rhythm Borderline T abnormalities,  anterior leads Baseline wander in lead(s) I II aVR No prior ECG for comparison. No STEMI Confirmed by Antony Blackbird [phone]) on 02/24/2020 1:23:47 PM   Radiology DG Chest Portable 1 View  Result Date: 02/24/2020 CLINICAL DATA:  Shortness of breath EXAM: PORTABLE CHEST 1 VIEW COMPARISON:  None. FINDINGS: The heart size and mediastinal contours are within normal limits. Both lungs are clear. The visualized skeletal structures are unremarkable. IMPRESSION: No active disease. Electronically Signed   By: Inez Catalina M.D.   On: 02/24/2020 14:16    Procedures Procedures (including critical care time)  Medications Ordered in ED Medications  acetaminophen (TYLENOL) tablet 650 mg (650 mg Oral Not Given 02/24/20 1845)  sodium chloride 0.9 % bolus 1,000 mL (0 mLs Intravenous Stopped 02/24/20 1846)  potassium chloride SA (KLOR-CON) CR tablet 40 mEq (40 mEq Oral Given 02/24/20 1519)  potassium chloride 10 mEq in 100 mL IVPB (0 mEq Intravenous Stopped 02/24/20 1846)  metoCLOPramide (REGLAN) injection 10 mg (10 mg Intravenous Given 02/24/20 1612)  diphenhydrAMINE (BENADRYL) injection 25 mg (25 mg Intravenous Given 02/24/20 1611)  sodium chloride 0.9 % bolus 500 mL (0 mLs Intravenous Stopped 02/24/20 1846)    ED Course  I have reviewed the triage vital signs and the nursing notes.  Pertinent labs & imaging results that were available during my care of the patient were reviewed by me and considered in my medical decision making (see chart for details).    MDM Rules/Calculators/A&P                          34 year old female presenting for evaluation of an episode of lightheadedness. Currently [redacted] weeks pregnant. Has had several similar episodes throughout pregnancy. EMS reported episode of SVT in route when patient was symptomatic. Patient also complaining of headache and has had migraines throughout pregnancy.  Neurologic exam is within normal limits. Remainder of her exam is benign.  [facility]  contacted and evaluated the patient. They state patient is cleared from  OB standpoint.  CBC nonacute BMP with potassium of 2.9, slightly low bicarb at 21, otherwise reassuring  -Potassium supplemented IV and p.o. Mag normal Delta troponins negative TSH negative  EKG Sinus rhythm Borderline T abnormalities, anterior leads Baseline wander in lead(s) I II aVR No prior ECG for comparison. No STEMI  CXR with no acute disease  Patient was ambulated in the ED and maintain her sats at 96 and 100%.  I have low suspicion for PE or other emergent cardiopulmonary cause of her shortness of breath.  I suspect that her shortness of breath is likely related to her pregnancy.  With regards to her lightheadedness, she had an episode of possible SVT earlier today.  She has normal blood pressure and no proteinuria to suggest preeclampsia or other complication from her pregnancy.  She was evaluated by rapid response OB who has cleared her from their standpoint.  She has been monitored in the ED for a period of 7 hours and has remained stable on our telemetry monitoring without any episodes of SVT.  She was given IV fluid hydration.  Her orthostatics are negative.  Her potassium was somewhat low on her labs which could be contributing to her symptoms therefore she received potassium supplementation in the ED and was given a short course of potassium for home.  With regards to her headache, these have been present throughout the entirety of her pregnancy.  She was given Reglan and Benadryl in the ED today as well as Tylenol and on reassessment states her headache is improving.  Her neurologic exam is within normal limits.  I have low suspicion for intracranial hemorrhage, CVA, venous sinus thrombosis or other emergent cause of headache at this time.  I recommended that she follow-up for repeat labs.  She states that she has an appointment tomorrow.  Advised on specific return precautions.  She voiced understanding of the plan  and reasons to return.  All questions answered.  Patient stable for discharge  Pt was seen in conjunction with Dr. Francia Greaves who personally evaluated the patient and is in agreement with the plan.   Final Clinical Impression(s) / ED Diagnoses Final diagnoses:  Near syncope  Hypokalemia    Rx / DC Orders ED Discharge Orders         Ordered    potassium chloride (KLOR-CON) 10 MEQ tablet  Daily     Discontinue  Reprint     02/24/20 1915           Bishop Dublin 02/24/20 1925    Valarie Merino, MD 02/27/20 1115

## 2020-02-24 NOTE — ED Notes (Signed)
Pt ambulatory on room air, SpO2 maintained between 96% and 100% throughout.

## 2020-02-24 NOTE — Progress Notes (Signed)
Spoke with Dr. Debroah Loop. Mia Wilkinson is a G6P5 at [redacted] weeks gestation. She has a hx of five cesarean sections and is scheduled for a cesarean section on 03/30/20. She gets her care at [facility]. She is here because she had a syncopal episode at work. She did not pass out. She says she has had headaches and synopal episodes during the pregnancy. The EMS reported the Mia Wilkinson had a run of SVT in the ambulance. The Mia Wilkinson has been in NSR with occaional complaints of dizziness and increases in her heart rate. The highest being 116 BPM. Her v/s are stable. No c/o chest pain. FHR baseline is 140BPM, moderate variability, accesls, no decels, and no uc's noted. No vaginal bleeding or leaking of fluid. Mia Wilkinson is getting IVF, labs have been drawn, and she is to have a chest x-ray. The Mia Wilkinson is OB cleared and Dr. Debroah Loop can be reached at [phone] for any concerns. ED staff notified.

## 2020-02-24 NOTE — Progress Notes (Signed)
Pt is a G6P5 at [redacted] weeks gestation here because she had a syncopal episode at work. Pt says she didn"t lose consciousness, but did get dizzy. She is also complaining of a headache. She denies any problems with her blood presure or any problems with any of her pregnancies. She has had five cesarean sections and is scheduled for a cesarean section on 03/30/20. She gets her care at [facility]. Pt says that her heart rate was 150 BPM at one time in the ambulance. She says that she has had headaches, syncopal episodes throughout this pregnancy.

## 2020-02-24 NOTE — ED Triage Notes (Signed)
Pt BIB [facility] from work. Pt states she was working at her desk and had a very strong feeling of dizziness. With EMS pt has period of increased heart rate in the 150s. Pt also complains of headaches. Pt states that she has had these spells throughout her pregnancy. Pt has been treated throughout her pregnancy for pre-eclampsia. Pt states that she has had 3 syncopal episodes throughout her pregnancy. Pt scheduled for c-section in a few weeks.

## 2020-02-24 NOTE — ED Notes (Signed)
Discharge instructions reviewed with pt. Pt verbalized understanding.   

## 2020-02-24 NOTE — Discharge Instructions (Signed)
Take potassium as directed   Please follow up with your primary care provider within 5-7 days for re-evaluation of your symptoms. If you do not have a primary care provider, information for a healthcare clinic has been provided for you to make arrangements for follow up care. Please return to the emergency department for any new or worsening symptoms.  

## 2020-02-25 ENCOUNTER — Ambulatory Visit (INDEPENDENT_AMBULATORY_CARE_PROVIDER_SITE_OTHER): Payer: Medicaid Other | Admitting: Obstetrics and Gynecology

## 2020-02-25 VITALS — BP 125/76 | HR 107 | Wt 191.0 lb

## 2020-02-25 DIAGNOSIS — O34219 Maternal care for unspecified type scar from previous cesarean delivery: Secondary | ICD-10-CM

## 2020-02-25 DIAGNOSIS — Z98891 History of uterine scar from previous surgery: Secondary | ICD-10-CM

## 2020-02-25 DIAGNOSIS — Z3A34 34 weeks gestation of pregnancy: Secondary | ICD-10-CM

## 2020-02-25 DIAGNOSIS — O26893 Other specified pregnancy related conditions, third trimester: Secondary | ICD-10-CM

## 2020-02-25 DIAGNOSIS — E669 Obesity, unspecified: Secondary | ICD-10-CM

## 2020-02-25 DIAGNOSIS — O99210 Obesity complicating pregnancy, unspecified trimester: Secondary | ICD-10-CM

## 2020-02-25 DIAGNOSIS — O0990 Supervision of high risk pregnancy, unspecified, unspecified trimester: Secondary | ICD-10-CM

## 2020-02-25 DIAGNOSIS — R519 Headache, unspecified: Secondary | ICD-10-CM

## 2020-02-25 DIAGNOSIS — O99213 Obesity complicating pregnancy, third trimester: Secondary | ICD-10-CM

## 2020-02-25 DIAGNOSIS — O0993 Supervision of high risk pregnancy, unspecified, third trimester: Secondary | ICD-10-CM

## 2020-02-25 NOTE — Progress Notes (Signed)
ROB   CC: pt states she passed out at work yesterday went to [facility].  Pt states she has a HA today.

## 2020-02-25 NOTE — Progress Notes (Signed)
   PRENATAL VISIT NOTE  Subjective:  Mia Wilkinson is a 34 y.o. G6P5005 at [redacted]w[redacted]d being seen today for ongoing prenatal care.  She is currently monitored for the following issues for this high-risk pregnancy and has History of cesarean delivery, currently pregnant; Supervision of high risk pregnancy, antepartum; Hernia, ventral; Maternal obesity affecting pregnancy, antepartum; Frequent headaches; Headache in pregnancy, antepartum, third trimester; and Unwanted fertility on their problem list.  Patient doing well with no acute concerns today. She reports mild headache.  Contractions: Irritability. Vag. Bleeding: None.  Movement: Present. Denies leaking of fluid.  Pt was seen in the ED last night with possible SVT and Heart rate to 150.  Pt received potassium and was monitored.  Feels slightly better today. The following portions of the patient's history were reviewed and updated as appropriate: allergies, current medications, past family history, past medical history, past social history, past surgical history and problem list. Problem list updated.  Objective:   Vitals:   02/25/20 1106  BP: 125/76  Pulse: (!) 107  Weight: 191 lb (86.6 kg)    Fetal Status: Fetal Heart Rate (bpm): 142   Movement: Present     General:  Alert, oriented and cooperative. Patient is in no acute distress.  Skin: Skin is warm and dry. No rash noted.   Cardiovascular: Normal heart rate noted  Respiratory: Normal respiratory effort, no problems with respiration noted  Abdomen: Soft, gravid, appropriate for gestational age.  Pain/Pressure: Absent     Pelvic: Cervical exam deferred        Extremities: Normal range of motion.  Edema: None  Mental Status:  Normal mood and affect. Normal behavior. Normal judgment and thought content.   Assessment and Plan:  Pregnancy: G6P5005 at [redacted]w[redacted]d  1. History of cesarean delivery, currently pregnant Need to check and see if procedure can be moved to morning  2. Supervision  of high risk pregnancy, antepartum   3. Maternal obesity affecting pregnancy, antepartum   4. Frequent headaches   Preterm labor symptoms and general obstetric precautions including but not limited to vaginal bleeding, contractions, leaking of fluid and fetal movement were reviewed in detail with the patient.  Will look into potential  Cardiac referral for eval and possible Holter monitor  Please refer to After Visit Summary for other counseling recommendations.   Return in about 2 weeks (around 03/10/2020) for HOB, 36 weeks swabs.   Mariel Aloe, MD

## 2020-03-10 ENCOUNTER — Other Ambulatory Visit (HOSPITAL_COMMUNITY)
Admission: RE | Admit: 2020-03-10 | Discharge: 2020-03-10 | Disposition: A | Discharge status: Home / Self Care | Payer: Medicaid Other | Source: Ambulatory Visit | Attending: Obstetrics and Gynecology | Admitting: Obstetrics and Gynecology

## 2020-03-10 ENCOUNTER — Encounter: Payer: Self-pay | Admitting: Obstetrics and Gynecology

## 2020-03-10 ENCOUNTER — Ambulatory Visit (INDEPENDENT_AMBULATORY_CARE_PROVIDER_SITE_OTHER): Payer: Medicaid Other | Admitting: Obstetrics and Gynecology

## 2020-03-10 ENCOUNTER — Other Ambulatory Visit: Payer: Self-pay

## 2020-03-10 VITALS — BP 108/72 | HR 94 | Wt 191.5 lb

## 2020-03-10 DIAGNOSIS — Z98891 History of uterine scar from previous surgery: Secondary | ICD-10-CM

## 2020-03-10 DIAGNOSIS — O0993 Supervision of high risk pregnancy, unspecified, third trimester: Secondary | ICD-10-CM

## 2020-03-10 DIAGNOSIS — E669 Obesity, unspecified: Secondary | ICD-10-CM

## 2020-03-10 DIAGNOSIS — O0990 Supervision of high risk pregnancy, unspecified, unspecified trimester: Secondary | ICD-10-CM | POA: Diagnosis not present

## 2020-03-10 DIAGNOSIS — O26893 Other specified pregnancy related conditions, third trimester: Secondary | ICD-10-CM

## 2020-03-10 DIAGNOSIS — O99213 Obesity complicating pregnancy, third trimester: Secondary | ICD-10-CM

## 2020-03-10 DIAGNOSIS — R109 Unspecified abdominal pain: Secondary | ICD-10-CM

## 2020-03-10 DIAGNOSIS — O34219 Maternal care for unspecified type scar from previous cesarean delivery: Secondary | ICD-10-CM

## 2020-03-10 DIAGNOSIS — Z3A36 36 weeks gestation of pregnancy: Secondary | ICD-10-CM

## 2020-03-10 NOTE — Progress Notes (Signed)
Pt is here for ROB, [redacted]w[redacted]d.

## 2020-03-10 NOTE — Progress Notes (Signed)
   PRENATAL VISIT NOTE  Subjective:  Mia Wilkinson is a 34 y.o. G6P5005 at [redacted]w[redacted]d being seen today for ongoing prenatal care.  She is currently monitored for the following issues for this high-risk pregnancy and has History of cesarean delivery, currently pregnant; Supervision of high risk pregnancy, antepartum; Hernia, ventral; Maternal obesity affecting pregnancy, antepartum; Frequent headaches; Headache in pregnancy, antepartum, third trimester; and Unwanted fertility on their problem list.  Patient reports irregular contractions.  The patient states that the contractions last night were a 10/10.  She denies any leakage of fluids or vaginal bleeding.  The patient reports good fetal movement.  .  Contractions: Irregular. Vag. Bleeding: None.  Movement: Present. Denies leaking of fluid.   The following portions of the patient's history were reviewed and updated as appropriate: allergies, current medications, past family history, past medical history, past social history, past surgical history and problem list.   Objective:   Vitals:   03/10/20 1041  BP: 108/72  Pulse: 94  Weight: 86.9 kg    Fetal Status: Fetal Heart Rate (bpm): 145 Fundal Height: 36 cm Movement: Present  Presentation: Vertex  General:  Alert, oriented and cooperative. Patient is in no acute distress.  Skin: Skin is warm and dry. No rash noted.   Cardiovascular: Normal heart rate noted  Respiratory: Normal respiratory effort, no problems with respiration noted  Abdomen: Soft, gravid, appropriate for gestational age. Mild, suprapubic tenderness noted. Pain/Pressure: Present     Pelvic: closed Dilation: Closed Effacement (%): 40 Station: -3  Extremities: Normal range of motion.  Edema: None  Mental Status: Normal mood and affect. Normal behavior. Normal judgment and thought content.   Assessment and Plan:  Pregnancy: G6P5005 at [redacted]w[redacted]d 1. Supervision of high risk pregnancy, antepartum GBS and genital cultures collected  today.  2. History of cesarean delivery, currently pregnant X 5.  Patient scheduled for c/section on 03/30/2020.  3. Abdominal pain during pregnancy in third trimester Cervix currently closed.  With history of 5 c/sections, will send to L&D for extended monitoring.    Preterm labor symptoms and general obstetric precautions including but not limited to vaginal bleeding, contractions, leaking of fluid and fetal movement were reviewed in detail with the patient.  Patient instructed that she should present to labor and delivery if she has any contractions.  Strict abdominal pain precautions given.   Please refer to After Visit Summary for other counseling recommendations.   Return in about 1 week (around 03/17/2020) for HROB.  No future appointments.  Johnny Bridge, MD

## 2020-03-11 LAB — CERVICOVAGINAL ANCILLARY ONLY
Chlamydia: NEGATIVE
Comment: NEGATIVE
Comment: NORMAL
Neisseria Gonorrhea: NEGATIVE

## 2020-03-12 LAB — STREP GP B NAA: Strep Gp B NAA: NEGATIVE

## 2020-03-15 ENCOUNTER — Other Ambulatory Visit: Payer: Self-pay | Admitting: Registered Nurse

## 2020-03-15 ENCOUNTER — Inpatient Hospital Stay (HOSPITAL_COMMUNITY)
Admission: AD | Admit: 2020-03-15 | Discharge: 2020-03-15 | Disposition: A | Discharge status: Home / Self Care | Payer: Medicaid Other | Attending: Obstetrics and Gynecology | Admitting: Obstetrics and Gynecology

## 2020-03-15 DIAGNOSIS — O99013 Anemia complicating pregnancy, third trimester: Secondary | ICD-10-CM | POA: Diagnosis not present

## 2020-03-15 DIAGNOSIS — O4703 False labor before 37 completed weeks of gestation, third trimester: Secondary | ICD-10-CM | POA: Diagnosis not present

## 2020-03-15 DIAGNOSIS — R0902 Hypoxemia: Secondary | ICD-10-CM | POA: Diagnosis not present

## 2020-03-15 DIAGNOSIS — O26893 Other specified pregnancy related conditions, third trimester: Secondary | ICD-10-CM | POA: Diagnosis present

## 2020-03-15 DIAGNOSIS — R55 Syncope and collapse: Secondary | ICD-10-CM | POA: Insufficient documentation

## 2020-03-15 DIAGNOSIS — K219 Gastro-esophageal reflux disease without esophagitis: Secondary | ICD-10-CM | POA: Diagnosis not present

## 2020-03-15 DIAGNOSIS — D649 Anemia, unspecified: Secondary | ICD-10-CM | POA: Diagnosis not present

## 2020-03-15 DIAGNOSIS — O99613 Diseases of the digestive system complicating pregnancy, third trimester: Secondary | ICD-10-CM | POA: Insufficient documentation

## 2020-03-15 DIAGNOSIS — Z7982 Long term (current) use of aspirin: Secondary | ICD-10-CM | POA: Insufficient documentation

## 2020-03-15 DIAGNOSIS — Z79899 Other long term (current) drug therapy: Secondary | ICD-10-CM | POA: Insufficient documentation

## 2020-03-15 DIAGNOSIS — R000 Tachycardia, unspecified: Secondary | ICD-10-CM | POA: Diagnosis not present

## 2020-03-15 DIAGNOSIS — Z3A36 36 weeks gestation of pregnancy: Secondary | ICD-10-CM | POA: Diagnosis not present

## 2020-03-15 DIAGNOSIS — O479 False labor, unspecified: Secondary | ICD-10-CM

## 2020-03-15 DIAGNOSIS — O9089 Other complications of the puerperium, not elsewhere classified: Secondary | ICD-10-CM | POA: Diagnosis not present

## 2020-03-15 DIAGNOSIS — Z98891 History of uterine scar from previous surgery: Secondary | ICD-10-CM | POA: Diagnosis not present

## 2020-03-15 DIAGNOSIS — O0943 Supervision of pregnancy with grand multiparity, third trimester: Secondary | ICD-10-CM | POA: Insufficient documentation

## 2020-03-15 LAB — COMPREHENSIVE METABOLIC PANEL
ALT: 14 U/L (ref 0–44)
AST: 14 U/L — ABNORMAL LOW (ref 15–41)
Albumin: 2.5 g/dL — ABNORMAL LOW (ref 3.5–5.0)
Alkaline Phosphatase: 82 U/L (ref 38–126)
Anion gap: 12 (ref 5–15)
BUN: 7 mg/dL (ref 6–20)
CO2: 18 mmol/L — ABNORMAL LOW (ref 22–32)
Calcium: 8.4 mg/dL — ABNORMAL LOW (ref 8.9–10.3)
Chloride: 104 mmol/L (ref 98–111)
Creatinine, Ser: 0.51 mg/dL (ref 0.44–1.00)
GFR calc Af Amer: >60 mL/min (ref >60)
GFR calc non Af Amer: >60 mL/min (ref >60)
Glucose, Bld: 112 mg/dL — ABNORMAL HIGH (ref 70–99)
Potassium: 3 mmol/L — ABNORMAL LOW (ref 3.5–5.1)
Sodium: 134 mmol/L — ABNORMAL LOW (ref 135–145)
Total Bilirubin: 0.2 mg/dL — ABNORMAL LOW (ref 0.3–1.2)
Total Protein: 6.8 g/dL (ref 6.5–8.1)

## 2020-03-15 LAB — CBC
HCT: 34 % — ABNORMAL LOW (ref 36.0–46.0)
Hemoglobin: 10 g/dL — ABNORMAL LOW (ref 12.0–15.0)
MCH: 24.4 pg — ABNORMAL LOW (ref 26.0–34.0)
MCHC: 29.4 g/dL — ABNORMAL LOW (ref 30.0–36.0)
MCV: 83.1 fL (ref 80.0–100.0)
Platelets: 158 10*3/uL (ref 150–400)
RBC: 4.09 MIL/uL (ref 3.87–5.11)
RDW: 17.2 % — ABNORMAL HIGH (ref 11.5–15.5)
WBC: 9.5 10*3/uL (ref 4.0–10.5)
nRBC: 0 % (ref 0.0–0.2)

## 2020-03-15 MED ORDER — POTASSIUM CHLORIDE ER 10 MEQ PO TBCR: 10.0000 meq | EXTENDED_RELEASE_TABLET | Freq: Every day | ORAL | 0 refills | Status: DC

## 2020-03-15 MED ORDER — LACTATED RINGERS IV BOLUS
1000.0000 mL | Freq: Once | INTRAVENOUS | Status: AC
  Administered 2020-03-15: 1000 mL via INTRAVENOUS

## 2020-03-15 MED ORDER — SODIUM CHLORIDE 0.9 % IV SOLN
510.0000 mg | Freq: Once | INTRAVENOUS | Status: AC
  Administered 2020-03-15: 510 mg via INTRAVENOUS
  Filled 2020-03-15: qty 17

## 2020-03-15 MED ORDER — SODIUM CHLORIDE 0.9 % IV SOLN: INTRAVENOUS | Status: DC

## 2020-03-15 NOTE — Discharge Instructions (Signed)

## 2020-03-15 NOTE — MAU Note (Addendum)
Pt reports to [facility] via ems with c/o dizziness this morning that happened suddenly.  Pt also reports irregular ctx for the past few days.  Pt denies LOF and reports good fetal movement.  Denies bleeding. Pt also reporting SOB and headache.

## 2020-03-15 NOTE — MAU Provider Note (Signed)
History     CSN: [id number]  Arrival date and time: 03/15/20 1327   First Provider Initiated Contact with Patient 03/15/20 1345      Chief Complaint  Patient presents with  . Contractions  . Near Syncope   HPI Mia Wilkinson is a 34 y.o. [id number] at 62w6dwho presents via EMS for near syncope. She states she just all of a sudden felt dizzy like she might pass out. This has happened before. She reports intermittent contractions. Denies any leaking or bleeding. Reports normal fetal movement. She reports eating before the episode. She is requesting to be delivered.   OB History    Gravida  6   Para  5   Term  5   Preterm  0   AB  0   Living  5     SAB  0   TAB  0   Ectopic  0   Multiple  0   Live Births  5           Past Medical History:  Diagnosis Date  . Anemia   . GERD (gastroesophageal reflux disease)    with pregnancy    Past Surgical History:  Procedure Laterality Date  . CESAREAN SECTION     x 3  . CESAREAN SECTION N/A 12/18/2013   Procedure: Repeat CESAREAN SECTION;  Surgeon: BFrederico Hamman MD;  [facility];  Service: Obstetrics;  Laterality: N/A;  . CESAREAN SECTION N/A 01/13/2015   Procedure: REPEAT CESAREAN SECTION;  Surgeon: BFrederico Hamman MD;  [facility];  Service: Obstetrics;  Laterality: N/A;  . LIPOSUCTION  05/02/2019    Family History  Problem Relation Age of Onset  . Hypertension Mother     Social History   Tobacco Use  . Smoking status: Never Smoker  . Smokeless tobacco: Never Used  Vaping Use  . Vaping Use: Never used  Substance Use Topics  . Alcohol use: No  . Drug use: No    Allergies: No Known Allergies  Medications Prior to Admission  Medication Sig Dispense Refill Last Dose  . aspirin EC 81 MG tablet Take 1 tablet (81 mg total) by mouth daily. Take after 12 weeks for prevention of preeclampsia later in pregnancy 300 tablet 2   . Blood Pressure Monitoring (BLOOD PRESSURE KIT) DEVI 1 Device  by Does not apply route as needed. 1 each 0   . Butalbital-APAP-Caffeine 50-325-40 MG capsule Take 1-2 capsules by mouth every 6 (six) hours as needed for headache. 20 capsule 1   . FERROUSUL 325 (65 Fe) MG tablet Take 1 tablet (325 mg total) by mouth every other day. 45 tablet 3   . potassium chloride (KLOR-CON) 10 MEQ tablet Take 1 tablet (10 mEq total) by mouth daily for 3 days. 3 tablet 0   . Prenatal MV-Min-FA-Omega-3 (PRENATAL GUMMIES/DHA & FA) 0.4-32.5 MG CHEW Chew 1 tablet by mouth daily.      .Marland Kitchenterconazole (TERAZOL 7) 0.4 % vaginal cream Place 1 applicator vaginally at bedtime. (Patient not taking: Reported on 01/28/2020) 45 g 0     Review of Systems  Constitutional: Negative.  Negative for fatigue and fever.  HENT: Negative.   Respiratory: Negative.  Negative for shortness of breath.   Cardiovascular: Negative.  Negative for chest pain.  Gastrointestinal: Positive for abdominal pain. Negative for constipation, diarrhea, nausea and vomiting.  Genitourinary: Negative.  Negative for dysuria.  Neurological: Positive for dizziness and light-headedness. Negative for headaches.   Physical  Exam   Blood pressure 94/75, pulse (!) 116, temperature 97.9 F (36.6 C), temperature source Oral, resp. rate 20, last menstrual period 07/01/2019, unknown if currently breastfeeding.  Physical Exam Vitals and nursing note reviewed.  Constitutional:      General: She is not in acute distress.    Appearance: She is well-developed.  HENT:     Head: Normocephalic.  Eyes:     Pupils: Pupils are equal, round, and reactive to light.  Cardiovascular:     Rate and Rhythm: Normal rate and regular rhythm.     Heart sounds: Normal heart sounds.  Pulmonary:     Effort: Pulmonary effort is normal. No respiratory distress.     Breath sounds: Normal breath sounds.  Abdominal:     General: Bowel sounds are normal. There is no distension.     Palpations: Abdomen is soft.     Tenderness: There is no  abdominal tenderness.  Skin:    General: Skin is warm and dry.  Neurological:     Mental Status: She is alert and oriented to person, place, and time.  Psychiatric:        Behavior: Behavior normal.        Thought Content: Thought content normal.        Judgment: Judgment normal.    Fetal Tracing:  Baseline: 145 Variability: moderate Accels: 15x15 Decels: none  Toco: occasional uc's  Dilation: Closed Effacement (%): Thick Presentation: Undeterminable Exam by:: Fredda Hammed RN  MAU Course  Procedures Results for orders placed or performed during the hospital encounter of 03/15/20 (from the past 24 hour(s))  CBC     Status: Abnormal   Collection Time: 03/15/20  2:05 PM  Result Value Ref Range   WBC 9.5 4.0 - 10.5 K/uL   RBC 4.09 3.87 - 5.11 MIL/uL   Hemoglobin 10.0 (L) 12.0 - 15.0 g/dL   HCT 34.0 (L) 36 - 46 %   MCV 83.1 80.0 - 100.0 fL   MCH 24.4 (L) 26.0 - 34.0 pg   MCHC 29.4 (L) 30.0 - 36.0 g/dL   RDW 17.2 (H) 11.5 - 15.5 %   Platelets 158 150 - 400 K/uL   nRBC 0.0 0.0 - 0.2 %  Comprehensive metabolic panel     Status: Abnormal   Collection Time: 03/15/20  2:05 PM  Result Value Ref Range   Sodium 134 (L) 135 - 145 mmol/L   Potassium 3.0 (L) 3.5 - 5.1 mmol/L   Chloride 104 98 - 111 mmol/L   CO2 18 (L) 22 - 32 mmol/L   Glucose, Bld 112 (H) 70 - 99 mg/dL   BUN 7 6 - 20 mg/dL   Creatinine, Ser 0.51 0.44 - 1.00 mg/dL   Calcium 8.4 (L) 8.9 - 10.3 mg/dL   Total Protein 6.8 6.5 - 8.1 g/dL   Albumin 2.5 (L) 3.5 - 5.0 g/dL   AST 14 (L) 15 - 41 U/L   ALT 14 0 - 44 U/L   Alkaline Phosphatase 82 38 - 126 U/L   Total Bilirubin 0.2 (L) 0.3 - 1.2 mg/dL   GFR calc non Af Amer >60 >60 mL/min   GFR calc Af Amer >60 >60 mL/min   Anion gap 12 5 - 15   MDM UA CBC, CMP LR bolus IV Feraheme  Assessment and Plan   1. Anemia affecting pregnancy in third trimester   2. Near syncope   3. [redacted] weeks gestation of pregnancy   4. Braxton Hick's contraction    -  Discharge  home in stable condition -Rx for PO potassium sent to pharmacy -Labor precautions discussed -Patient advised to follow-up with OB as scheduled for prenatal care -Patient may return to MAU as needed or if her condition were to change or worsen   Wende Mott CNM 03/15/2020, 1:45 PM

## 2020-03-16 ENCOUNTER — Encounter (HOSPITAL_COMMUNITY): Payer: Self-pay

## 2020-03-16 NOTE — Patient Instructions (Signed)
Mia Wilkinson  03/16/2020   Your procedure is scheduled on:  03/30/2020  Arrive at 1030 at Entrance C on [employer] at [facility]  and [address]. You are invited to use the FREE valet parking or use the Visitor's parking deck.  Pick up the phone at the desk and dial [phone].  Call this number if you have problems the morning of surgery: [phone]  Remember:   Do not eat food:(After Midnight) Desps de medianoche.  Do not drink clear liquids: (After Midnight) Desps de medianoche.  Take these medicines the morning of surgery with A SIP OF WATER:  none   Do not wear jewelry, make-up or nail polish.  Do not wear lotions, powders, or perfumes. Do not wear deodorant.  Do not shave 48 hours prior to surgery.  Do not bring valuables to the hospital.  [facility] is not   responsible for any belongings or valuables brought to the hospital.  Contacts, dentures or bridgework may not be worn into surgery.  Leave suitcase in the car. After surgery it may be brought to your room.  For patients admitted to the hospital, checkout time is 11:00 AM the day of              discharge.      Please read over the following fact sheets that you were given:     Preparing for Surgery

## 2020-03-17 ENCOUNTER — Encounter (HOSPITAL_COMMUNITY): Payer: Self-pay

## 2020-03-17 ENCOUNTER — Encounter: Payer: Self-pay | Admitting: Obstetrics

## 2020-03-17 ENCOUNTER — Ambulatory Visit (INDEPENDENT_AMBULATORY_CARE_PROVIDER_SITE_OTHER): Payer: Medicaid Other | Admitting: Obstetrics

## 2020-03-17 ENCOUNTER — Other Ambulatory Visit: Payer: Self-pay

## 2020-03-17 VITALS — BP 113/69 | HR 97 | Wt 192.0 lb

## 2020-03-17 DIAGNOSIS — E669 Obesity, unspecified: Secondary | ICD-10-CM

## 2020-03-17 DIAGNOSIS — O99210 Obesity complicating pregnancy, unspecified trimester: Secondary | ICD-10-CM

## 2020-03-17 DIAGNOSIS — Z98891 History of uterine scar from previous surgery: Secondary | ICD-10-CM

## 2020-03-17 DIAGNOSIS — O0990 Supervision of high risk pregnancy, unspecified, unspecified trimester: Secondary | ICD-10-CM

## 2020-03-17 DIAGNOSIS — Z3A37 37 weeks gestation of pregnancy: Secondary | ICD-10-CM

## 2020-03-17 DIAGNOSIS — O99213 Obesity complicating pregnancy, third trimester: Secondary | ICD-10-CM

## 2020-03-17 DIAGNOSIS — O0993 Supervision of high risk pregnancy, unspecified, third trimester: Secondary | ICD-10-CM

## 2020-03-17 DIAGNOSIS — O34219 Maternal care for unspecified type scar from previous cesarean delivery: Secondary | ICD-10-CM

## 2020-03-17 NOTE — Progress Notes (Signed)
Subjective:  Mia Wilkinson is a 34 y.o. [phone] at [redacted]w[redacted]d being seen today for ongoing prenatal care.  She is currently monitored for the following issues for this high-risk pregnancy and has History of cesarean delivery, currently pregnant; Supervision of high risk pregnancy, antepartum; Hernia, ventral; Maternal obesity affecting pregnancy, antepartum; Frequent headaches; Headache in pregnancy, antepartum, third trimester; and Unwanted fertility on their problem list.  Patient reports no complaints.  Contractions: Irregular. Vag. Bleeding: None.  Movement: Present. Denies leaking of fluid.   The following portions of the patient's history were reviewed and updated as appropriate: allergies, current medications, past family history, past medical history, past social history, past surgical history and problem list. Problem list updated.  Objective:   Vitals:   03/17/20 1314  BP: 113/69  Pulse: 97  Weight: 192 lb (87.1 kg)    Fetal Status:     Movement: Present     General:  Alert, oriented and cooperative. Patient is in no acute distress.  Skin: Skin is warm and dry. No rash noted.   Cardiovascular: Normal heart rate noted  Respiratory: Normal respiratory effort, no problems with respiration noted  Abdomen: Soft, gravid, appropriate for gestational age. Pain/Pressure: Present     Pelvic:  Cervical exam deferred        Extremities: Normal range of motion.     Mental Status: Normal mood and affect. Normal behavior. Normal judgment and thought content.   Urinalysis:      Assessment and Plan:  Pregnancy: G6P5005 at [redacted]w[redacted]d  1. Supervision of high risk pregnancy, antepartum  2. History of cesarean delivery, currently pregnant - wants repeat C/S and BTL. - BTL papers signed   3. Maternal obesity affecting pregnancy, antepartum - taking Baby ASA  Term labor symptoms and general obstetric precautions including but not limited to vaginal bleeding, contractions, leaking of fluid and  fetal movement were reviewed in detail with the patient. Please refer to After Visit Summary for other counseling recommendations.   Return in about 1 week (around 03/24/2020) for MyChart.   Brock Bad, MD  03/17/20

## 2020-03-17 NOTE — Progress Notes (Signed)
Patient reports fetal movement with irregular contractions. 

## 2020-03-24 ENCOUNTER — Telehealth (INDEPENDENT_AMBULATORY_CARE_PROVIDER_SITE_OTHER): Payer: Medicaid Other | Admitting: Family Medicine

## 2020-03-24 ENCOUNTER — Encounter: Payer: Self-pay | Admitting: Family Medicine

## 2020-03-24 DIAGNOSIS — Z3009 Encounter for other general counseling and advice on contraception: Secondary | ICD-10-CM

## 2020-03-24 DIAGNOSIS — K439 Ventral hernia without obstruction or gangrene: Secondary | ICD-10-CM

## 2020-03-24 DIAGNOSIS — O0990 Supervision of high risk pregnancy, unspecified, unspecified trimester: Secondary | ICD-10-CM

## 2020-03-24 DIAGNOSIS — O34219 Maternal care for unspecified type scar from previous cesarean delivery: Secondary | ICD-10-CM

## 2020-03-24 DIAGNOSIS — E669 Obesity, unspecified: Secondary | ICD-10-CM

## 2020-03-24 DIAGNOSIS — Z3A38 38 weeks gestation of pregnancy: Secondary | ICD-10-CM

## 2020-03-24 DIAGNOSIS — O26893 Other specified pregnancy related conditions, third trimester: Secondary | ICD-10-CM

## 2020-03-24 DIAGNOSIS — R519 Headache, unspecified: Secondary | ICD-10-CM

## 2020-03-24 DIAGNOSIS — O99613 Diseases of the digestive system complicating pregnancy, third trimester: Secondary | ICD-10-CM

## 2020-03-24 DIAGNOSIS — O99210 Obesity complicating pregnancy, unspecified trimester: Secondary | ICD-10-CM

## 2020-03-24 NOTE — Progress Notes (Signed)
  Patient ID: Mia Wilkinson, female   DOB: 28-Apr-1986, 34 y.o.   MRN: [id number]  I connected with@ on 03/24/20 at 10:15 AM EDT by: MyChart video and verified that I am speaking with the correct person using two identifiers.  Patient is located at home and provider is located at [facility].     The purpose of this virtual visit is to provide medical care while limiting exposure to the novel coronavirus. I discussed the limitations, risks, security and privacy concerns of performing an evaluation and management service by MyChart video and the availability of in person appointments. I also discussed with the patient that there may be a patient responsible charge related to this service. By engaging in this virtual visit, you consent to the provision of healthcare.  Additionally, you authorize for your insurance to be billed for the services provided during this visit.  The patient expressed understanding and agreed to proceed.  The following staff members participated in the virtual visit:  Venora Maples MD/MPH    PRENATAL VISIT NOTE  Subjective:  Mia Wilkinson is a 34 y.o. [id number] at [redacted]w[redacted]d  for phone visit for ongoing prenatal care.  She is currently monitored for the following issues for this high-risk pregnancy and has History of cesarean delivery, currently pregnant; Supervision of high risk pregnancy, antepartum; Hernia, ventral; Maternal obesity affecting pregnancy, antepartum; Frequent headaches; Headache in pregnancy, antepartum, third trimester; and Unwanted fertility on their problem list.  Patient reports no complaints.  Contractions: Irregular. Vag. Bleeding: None.  Movement: Present. Denies leaking of fluid.   The following portions of the patient's history were reviewed and updated as appropriate: allergies, current medications, past family history, past medical history, past social history, past surgical history and problem list.   Objective:  There were no vitals filed for  this visit. Self-Obtained  Fetal Status:     Movement: Present     Assessment and Plan:  Pregnancy: G6P5005 at [redacted]w[redacted]d 1. Maternal obesity affecting pregnancy, antepartum   2. Frequent headaches   3. Supervision of high risk pregnancy, antepartum Stable Intermittent contractions though none today Discussed hospital precautions in detail especially contractions but also LOF, VB, DFM Discussed delivery in detail, mother will be coming as support person but has medical appt scheduled for 1330 so she is worried she may not be able to stay for whole surgery Discussed that given RCS x6 may encounter significant scarring and may take a while, hopefully if earlier scheduled sections (two others ahead of her) are completed early she can be taken earlier  4. Ventral hernia without obstruction or gangrene   5. Unwanted fertility Discussed salpingectomy if technically feasible, she is OK with this   6. History of cesarean delivery, currently pregnant RCS x6 scheduled for 03/30/2020  Term labor symptoms and general obstetric precautions including but not limited to vaginal bleeding, contractions, leaking of fluid and fetal movement were reviewed in detail with the patient.  Return in about 7 weeks (around 05/12/2020), or post partum visit, for PP check.  Future Appointments  Date Time Provider Department Center  03/28/2020  8:30 AM [facility] 1 [facility] None     Time spent on virtual visit: 20 minutes  Venora Maples, MD

## 2020-03-24 NOTE — Patient Instructions (Signed)
Contraception Choices Contraception, also called birth control, refers to methods or devices that prevent pregnancy. Hormonal methods Contraceptive implant  A contraceptive implant is a thin, plastic tube that contains a hormone. It is inserted into the upper part of the arm. It can remain in place for up to 3 years. Progestin-only injections Progestin-only injections are injections of progestin, a synthetic form of the hormone progesterone. They are given every 3 months by a health care provider. Birth control pills  Birth control pills are pills that contain hormones that prevent pregnancy. They must be taken once a day, preferably at the same time each day. Birth control patch  The birth control patch contains hormones that prevent pregnancy. It is placed on the skin and must be changed once a week for three weeks and removed on the fourth week. A prescription is needed to use this method of contraception. Vaginal ring  A vaginal ring contains hormones that prevent pregnancy. It is placed in the vagina for three weeks and removed on the fourth week. After that, the process is repeated with a new ring. A prescription is needed to use this method of contraception. Emergency contraceptive Emergency contraceptives prevent pregnancy after unprotected sex. They come in pill form and can be taken up to 5 days after sex. They work best the sooner they are taken after having sex. Most emergency contraceptives are available without a prescription. This method should not be used as your only form of birth control. Barrier methods Female condom  A female condom is a thin sheath that is worn over the penis during sex. Condoms keep sperm from going inside a woman's body. They can be used with a spermicide to increase their effectiveness. They should be disposed after a single use. Female condom  A female condom is a soft, loose-fitting sheath that is put into the vagina before sex. The condom keeps sperm  from going inside a woman's body. They should be disposed after a single use. Diaphragm  A diaphragm is a soft, dome-shaped barrier. It is inserted into the vagina before sex, along with a spermicide. The diaphragm blocks sperm from entering the uterus, and the spermicide kills sperm. A diaphragm should be left in the vagina for 6-8 hours after sex and removed within 24 hours. A diaphragm is prescribed and fitted by a health care provider. A diaphragm should be replaced every 1-2 years, after giving birth, after gaining more than 15 lb (6.8 kg), and after pelvic surgery. Cervical cap  A cervical cap is a round, soft latex or plastic cup that fits over the cervix. It is inserted into the vagina before sex, along with spermicide. It blocks sperm from entering the uterus. The cap should be left in place for 6-8 hours after sex and removed within 48 hours. A cervical cap must be prescribed and fitted by a health care provider. It should be replaced every 2 years. Sponge  A sponge is a soft, circular piece of polyurethane foam with spermicide on it. The sponge helps block sperm from entering the uterus, and the spermicide kills sperm. To use it, you make it wet and then insert it into the vagina. It should be inserted before sex, left in for at least 6 hours after sex, and removed and thrown away within 30 hours. Spermicides Spermicides are chemicals that kill or block sperm from entering the cervix and uterus. They can come as a cream, jelly, suppository, foam, or tablet. A spermicide should be inserted into the   vagina with an applicator at least 10-15 minutes before sex to allow time for it to work. The process must be repeated every time you have sex. Spermicides do not require a prescription. Intrauterine contraception Intrauterine device (IUD) An IUD is a T-shaped device that is put in a woman's uterus. There are two types:  Hormone IUD.This type contains progestin, a synthetic form of the hormone  progesterone. This type can stay in place for 3-5 years.  Copper IUD.This type is wrapped in copper wire. It can stay in place for 10 years.  Permanent methods of contraception Female tubal ligation In this method, a woman's fallopian tubes are sealed, tied, or blocked during surgery to prevent eggs from traveling to the uterus. Hysteroscopic sterilization In this method, a small, flexible insert is placed into each fallopian tube. The inserts cause scar tissue to form in the fallopian tubes and block them, so sperm cannot reach an egg. The procedure takes about 3 months to be effective. Another form of birth control must be used during those 3 months. Female sterilization This is a procedure to tie off the tubes that carry sperm (vasectomy). After the procedure, the man can still ejaculate fluid (semen). Natural planning methods Natural family planning In this method, a couple does not have sex on days when the woman could become pregnant. Calendar method This means keeping track of the length of each menstrual cycle, identifying the days when pregnancy can happen, and not having sex on those days. Ovulation method In this method, a couple avoids sex during ovulation. Symptothermal method This method involves not having sex during ovulation. The woman typically checks for ovulation by watching changes in her temperature and in the consistency of cervical mucus. Post-ovulation method In this method, a couple waits to have sex until after ovulation. Summary  Contraception, also called birth control, means methods or devices that prevent pregnancy.  Hormonal methods of contraception include implants, injections, pills, patches, vaginal rings, and emergency contraceptives.  Barrier methods of contraception can include female condoms, female condoms, diaphragms, cervical caps, sponges, and spermicides.  There are two types of IUDs (intrauterine devices). An IUD can be put in a woman's uterus to  prevent pregnancy for 3-5 years.  Permanent sterilization can be done through a procedure for males, females, or both.  Natural family planning methods involve not having sex on days when the woman could become pregnant. This information is not intended to replace advice given to you by your health care provider. Make sure you discuss any questions you have with your health care provider. Document Revised: 08/24/2017 Document Reviewed: 09/24/2016 Elsevier Patient Education  2020 [employer]  

## 2020-03-24 NOTE — Progress Notes (Signed)
Pt does not have BP cuff at home- never received from pharmacy.

## 2020-03-28 ENCOUNTER — Other Ambulatory Visit: Payer: Self-pay

## 2020-03-28 ENCOUNTER — Other Ambulatory Visit (HOSPITAL_COMMUNITY)
Admission: RE | Admit: 2020-03-28 | Discharge: 2020-03-28 | Disposition: A | Discharge status: Home / Self Care | Payer: Medicaid Other | Source: Ambulatory Visit | Attending: Obstetrics and Gynecology | Admitting: Obstetrics and Gynecology

## 2020-03-28 DIAGNOSIS — Z20822 Contact with and (suspected) exposure to covid-19: Secondary | ICD-10-CM | POA: Insufficient documentation

## 2020-03-28 DIAGNOSIS — Z01812 Encounter for preprocedural laboratory examination: Secondary | ICD-10-CM | POA: Diagnosis present

## 2020-03-28 HISTORY — DX: Cardiac arrhythmia, unspecified: I49.9

## 2020-03-28 LAB — CBC
HCT: 36.5 % (ref 36.0–46.0)
Hemoglobin: 10.8 g/dL — ABNORMAL LOW (ref 12.0–15.0)
MCH: 25.4 pg — ABNORMAL LOW (ref 26.0–34.0)
MCHC: 29.6 g/dL — ABNORMAL LOW (ref 30.0–36.0)
MCV: 85.7 fL (ref 80.0–100.0)
Platelets: 125 10*3/uL — ABNORMAL LOW (ref 150–400)
RBC: 4.26 MIL/uL (ref 3.87–5.11)
RDW: 20.6 % — ABNORMAL HIGH (ref 11.5–15.5)
WBC: 7.4 10*3/uL (ref 4.0–10.5)
nRBC: 0 % (ref 0.0–0.2)

## 2020-03-28 LAB — RPR: RPR Ser Ql: NONREACTIVE

## 2020-03-28 LAB — SARS CORONAVIRUS 2 BY RT PCR (HOSPITAL ORDER, PERFORMED IN ~~LOC~~ HOSPITAL LAB): SARS Coronavirus 2: NEGATIVE

## 2020-03-28 NOTE — MAU Note (Signed)
Pt reports to mau for covid swab and preob labs.  Pt denies any complaints at this time.  Preop directions reviewed, pt verbalized understanding

## 2020-03-30 ENCOUNTER — Inpatient Hospital Stay (HOSPITAL_COMMUNITY)
Admission: AD | Admit: 2020-03-30 | Discharge: 2020-04-01 | DRG: 785 | Disposition: A | Discharge status: Home / Self Care | Payer: Medicaid Other | Attending: Obstetrics & Gynecology | Admitting: Obstetrics & Gynecology

## 2020-03-30 ENCOUNTER — Inpatient Hospital Stay (HOSPITAL_COMMUNITY): Payer: Medicaid Other | Admitting: Certified Registered Nurse Anesthetist

## 2020-03-30 ENCOUNTER — Other Ambulatory Visit: Payer: Self-pay

## 2020-03-30 ENCOUNTER — Encounter (HOSPITAL_COMMUNITY): Payer: Self-pay | Admitting: Obstetrics and Gynecology

## 2020-03-30 ENCOUNTER — Encounter (HOSPITAL_COMMUNITY)
Admission: AD | Disposition: A | Discharge status: Home / Self Care | Payer: Self-pay | Source: Home / Self Care | Attending: Obstetrics & Gynecology

## 2020-03-30 ENCOUNTER — Inpatient Hospital Stay (HOSPITAL_COMMUNITY)
Admission: RE | Admit: 2020-03-30 | Payer: Medicaid Other | Source: Home / Self Care | Admitting: Obstetrics & Gynecology

## 2020-03-30 DIAGNOSIS — O99210 Obesity complicating pregnancy, unspecified trimester: Secondary | ICD-10-CM | POA: Diagnosis present

## 2020-03-30 DIAGNOSIS — Z302 Encounter for sterilization: Secondary | ICD-10-CM | POA: Diagnosis not present

## 2020-03-30 DIAGNOSIS — O4292 Full-term premature rupture of membranes, unspecified as to length of time between rupture and onset of labor: Secondary | ICD-10-CM | POA: Diagnosis not present

## 2020-03-30 DIAGNOSIS — O34211 Maternal care for low transverse scar from previous cesarean delivery: Principal | ICD-10-CM | POA: Diagnosis present

## 2020-03-30 DIAGNOSIS — O34219 Maternal care for unspecified type scar from previous cesarean delivery: Secondary | ICD-10-CM | POA: Diagnosis present

## 2020-03-30 DIAGNOSIS — E669 Obesity, unspecified: Secondary | ICD-10-CM | POA: Diagnosis present

## 2020-03-30 DIAGNOSIS — O4202 Full-term premature rupture of membranes, onset of labor within 24 hours of rupture: Secondary | ICD-10-CM | POA: Diagnosis not present

## 2020-03-30 DIAGNOSIS — Z3009 Encounter for other general counseling and advice on contraception: Secondary | ICD-10-CM | POA: Diagnosis present

## 2020-03-30 DIAGNOSIS — Z3A39 39 weeks gestation of pregnancy: Secondary | ICD-10-CM | POA: Diagnosis not present

## 2020-03-30 DIAGNOSIS — Z3A37 37 weeks gestation of pregnancy: Secondary | ICD-10-CM | POA: Diagnosis not present

## 2020-03-30 DIAGNOSIS — O99214 Obesity complicating childbirth: Secondary | ICD-10-CM | POA: Diagnosis not present

## 2020-03-30 LAB — POCT FERN TEST
POCT Fern Test: POSITIVE — AB
POCT Fern Test: POSITIVE

## 2020-03-30 LAB — PREPARE RBC (CROSSMATCH)

## 2020-03-30 SURGERY — Surgical Case
Anesthesia: Spinal | Site: Abdomen | Laterality: Bilateral | Wound class: Clean Contaminated

## 2020-03-30 MED ORDER — SIMETHICONE 80 MG PO CHEW
80.0000 mg | CHEWABLE_TABLET | ORAL | Status: DC
  Administered 2020-03-30: 80 mg via ORAL
  Filled 2020-03-30 (×2): qty 1

## 2020-03-30 MED ORDER — ONDANSETRON HCL 4 MG/2ML IJ SOLN: 4.0000 mg | Freq: Three times a day (TID) | INTRAMUSCULAR | Status: DC | PRN

## 2020-03-30 MED ORDER — COCONUT OIL OIL: 1.0000 "application " | TOPICAL_OIL | Status: DC | PRN

## 2020-03-30 MED ORDER — DEXAMETHASONE SODIUM PHOSPHATE 4 MG/ML IJ SOLN
INTRAMUSCULAR | Status: DC | PRN
  Administered 2020-03-30: 4 mg via INTRAVENOUS

## 2020-03-30 MED ORDER — ENOXAPARIN SODIUM 40 MG/0.4ML ~~LOC~~ SOLN
40.0000 mg | SUBCUTANEOUS | Status: DC
  Filled 2020-03-30 (×2): qty 0.4

## 2020-03-30 MED ORDER — SENNOSIDES-DOCUSATE SODIUM 8.6-50 MG PO TABS
2.0000 | ORAL_TABLET | ORAL | Status: DC
  Administered 2020-03-30: 2 via ORAL
  Filled 2020-03-30 (×2): qty 2

## 2020-03-30 MED ORDER — OXYTOCIN-SODIUM CHLORIDE 30-0.9 UT/500ML-% IV SOLN: 2.5000 [IU]/h | INTRAVENOUS | Status: AC

## 2020-03-30 MED ORDER — CEFAZOLIN SODIUM-DEXTROSE 2-4 GM/100ML-% IV SOLN
INTRAVENOUS | Status: AC
  Filled 2020-03-30: qty 100

## 2020-03-30 MED ORDER — MENTHOL 3 MG MT LOZG: 1.0000 | LOZENGE | OROMUCOSAL | Status: DC | PRN

## 2020-03-30 MED ORDER — TRANEXAMIC ACID-NACL 1000-0.7 MG/100ML-% IV SOLN
INTRAVENOUS | Status: DC | PRN
  Administered 2020-03-30: 1000 mg via INTRAVENOUS

## 2020-03-30 MED ORDER — PRENATAL MULTIVITAMIN CH
1.0000 | ORAL_TABLET | Freq: Every day | ORAL | Status: DC
  Administered 2020-03-31: 1 via ORAL
  Filled 2020-03-30: qty 1

## 2020-03-30 MED ORDER — IBUPROFEN 600 MG PO TABS
600.0000 mg | ORAL_TABLET | Freq: Four times a day (QID) | ORAL | Status: DC | PRN
  Administered 2020-03-30 – 2020-04-01 (×4): 600 mg via ORAL
  Filled 2020-03-30 (×4): qty 1

## 2020-03-30 MED ORDER — SOD CITRATE-CITRIC ACID 500-334 MG/5ML PO SOLN
30.0000 mL | Freq: Once | ORAL | Status: AC
  Administered 2020-03-30: 30 mL via ORAL

## 2020-03-30 MED ORDER — NALBUPHINE HCL 10 MG/ML IJ SOLN
5.0000 mg | INTRAMUSCULAR | Status: DC | PRN
  Administered 2020-03-30: 5 mg via INTRAVENOUS
  Filled 2020-03-30: qty 1

## 2020-03-30 MED ORDER — NALOXONE HCL 0.4 MG/ML IJ SOLN: 0.4000 mg | INTRAMUSCULAR | Status: DC | PRN

## 2020-03-30 MED ORDER — PHENYLEPHRINE HCL (PRESSORS) 10 MG/ML IV SOLN
INTRAVENOUS | Status: DC | PRN
  Administered 2020-03-30 (×2): 80 ug via INTRAVENOUS

## 2020-03-30 MED ORDER — FENTANYL CITRATE (PF) 100 MCG/2ML IJ SOLN
INTRAMUSCULAR | Status: AC
  Filled 2020-03-30: qty 2

## 2020-03-30 MED ORDER — WITCH HAZEL-GLYCERIN EX PADS: 1.0000 "application " | MEDICATED_PAD | CUTANEOUS | Status: DC | PRN

## 2020-03-30 MED ORDER — FENTANYL CITRATE (PF) 100 MCG/2ML IJ SOLN
25.0000 ug | INTRAMUSCULAR | Status: DC | PRN
  Administered 2020-03-30: 50 ug via INTRAVENOUS
  Administered 2020-03-30: 25 ug via INTRAVENOUS

## 2020-03-30 MED ORDER — CHLORHEXIDINE GLUCONATE 0.12 % MT SOLN
15.0000 mL | Freq: Once | OROMUCOSAL | Status: DC
  Filled 2020-03-30: qty 15

## 2020-03-30 MED ORDER — CEFAZOLIN SODIUM-DEXTROSE 2-3 GM-%(50ML) IV SOLR
INTRAVENOUS | Status: DC | PRN
  Administered 2020-03-30: 2 g via INTRAVENOUS

## 2020-03-30 MED ORDER — NALBUPHINE HCL 10 MG/ML IJ SOLN: 5.0000 mg | Freq: Once | INTRAMUSCULAR | Status: DC | PRN

## 2020-03-30 MED ORDER — SCOPOLAMINE 1 MG/3DAYS TD PT72
MEDICATED_PATCH | TRANSDERMAL | Status: AC
  Filled 2020-03-30: qty 1

## 2020-03-30 MED ORDER — NALBUPHINE HCL 10 MG/ML IJ SOLN: 5.0000 mg | INTRAMUSCULAR | Status: DC | PRN

## 2020-03-30 MED ORDER — SIMETHICONE 80 MG PO CHEW: 80.0000 mg | CHEWABLE_TABLET | ORAL | Status: DC | PRN

## 2020-03-30 MED ORDER — SODIUM CHLORIDE 0.9% IV SOLUTION: Freq: Once | INTRAVENOUS | Status: DC

## 2020-03-30 MED ORDER — OXYTOCIN-SODIUM CHLORIDE 30-0.9 UT/500ML-% IV SOLN
INTRAVENOUS | Status: DC | PRN
  Administered 2020-03-30: 41.7 mL/h via INTRAVENOUS
  Administered 2020-03-30: 500 mL via INTRAVENOUS

## 2020-03-30 MED ORDER — DIPHENHYDRAMINE HCL 50 MG/ML IJ SOLN: 12.5000 mg | INTRAMUSCULAR | Status: DC | PRN

## 2020-03-30 MED ORDER — MORPHINE SULFATE (PF) 0.5 MG/ML IJ SOLN
INTRAMUSCULAR | Status: DC | PRN
  Administered 2020-03-30: .15 ug via INTRATHECAL

## 2020-03-30 MED ORDER — LACTATED RINGERS IV SOLN: INTRAVENOUS | Status: DC

## 2020-03-30 MED ORDER — IBUPROFEN 800 MG PO TABS
800.0000 mg | ORAL_TABLET | Freq: Four times a day (QID) | ORAL | Status: DC
Start: 2020-03-31 — End: 2020-03-30

## 2020-03-30 MED ORDER — KETOROLAC TROMETHAMINE 30 MG/ML IJ SOLN: 30.0000 mg | Freq: Four times a day (QID) | INTRAMUSCULAR | Status: DC | PRN

## 2020-03-30 MED ORDER — SIMETHICONE 80 MG PO CHEW
80.0000 mg | CHEWABLE_TABLET | Freq: Three times a day (TID) | ORAL | Status: DC
  Administered 2020-03-30 – 2020-04-01 (×3): 80 mg via ORAL
  Filled 2020-03-30 (×3): qty 1

## 2020-03-30 MED ORDER — KETOROLAC TROMETHAMINE 30 MG/ML IJ SOLN: 30.0000 mg | Freq: Four times a day (QID) | INTRAMUSCULAR | Status: DC

## 2020-03-30 MED ORDER — ORAL CARE MOUTH RINSE: 15.0000 mL | Freq: Once | OROMUCOSAL | Status: DC

## 2020-03-30 MED ORDER — ZOLPIDEM TARTRATE 5 MG PO TABS
5.0000 mg | ORAL_TABLET | Freq: Every evening | ORAL | Status: DC | PRN
  Administered 2020-03-31: 5 mg via ORAL
  Filled 2020-03-30: qty 1

## 2020-03-30 MED ORDER — ACETAMINOPHEN 500 MG PO TABS
1000.0000 mg | ORAL_TABLET | Freq: Three times a day (TID) | ORAL | Status: DC | PRN
  Administered 2020-03-30 – 2020-04-01 (×2): 1000 mg via ORAL
  Filled 2020-03-30 (×2): qty 2

## 2020-03-30 MED ORDER — SOD CITRATE-CITRIC ACID 500-334 MG/5ML PO SOLN
ORAL | Status: AC
  Filled 2020-03-30: qty 30

## 2020-03-30 MED ORDER — MEPERIDINE HCL 25 MG/ML IJ SOLN: 6.2500 mg | INTRAMUSCULAR | Status: DC | PRN

## 2020-03-30 MED ORDER — NALOXONE HCL 4 MG/10ML IJ SOLN
1.0000 ug/kg/h | INTRAVENOUS | Status: DC | PRN
  Filled 2020-03-30: qty 5

## 2020-03-30 MED ORDER — LACTATED RINGERS IV SOLN: INTRAVENOUS | Status: DC | PRN

## 2020-03-30 MED ORDER — SODIUM CHLORIDE 0.9% FLUSH: 3.0000 mL | INTRAVENOUS | Status: DC | PRN

## 2020-03-30 MED ORDER — BUPIVACAINE IN DEXTROSE 0.75-8.25 % IT SOLN
INTRATHECAL | Status: DC | PRN
  Administered 2020-03-30: 1.6 mL via INTRATHECAL

## 2020-03-30 MED ORDER — TETANUS-DIPHTH-ACELL PERTUSSIS 5-2.5-18.5 LF-MCG/0.5 IM SUSP: 0.5000 mL | Freq: Once | INTRAMUSCULAR | Status: DC

## 2020-03-30 MED ORDER — MEPERIDINE HCL 25 MG/ML IJ SOLN
INTRAMUSCULAR | Status: DC | PRN
  Administered 2020-03-30 (×2): 12.5 mg via INTRAVENOUS

## 2020-03-30 MED ORDER — OXYCODONE HCL 5 MG PO TABS
5.0000 mg | ORAL_TABLET | ORAL | Status: DC | PRN
  Administered 2020-03-30 – 2020-03-31 (×3): 10 mg via ORAL
  Administered 2020-04-01: 5 mg via ORAL
  Administered 2020-04-01: 10 mg via ORAL
  Filled 2020-03-30 (×4): qty 2
  Filled 2020-03-30: qty 1

## 2020-03-30 MED ORDER — CEFAZOLIN SODIUM-DEXTROSE 2-4 GM/100ML-% IV SOLN: 2.0000 g | INTRAVENOUS | Status: DC

## 2020-03-30 MED ORDER — ACETAMINOPHEN 325 MG PO TABS: 650.0000 mg | ORAL_TABLET | Freq: Four times a day (QID) | ORAL | Status: DC | PRN

## 2020-03-30 MED ORDER — FENTANYL CITRATE (PF) 100 MCG/2ML IJ SOLN
INTRAMUSCULAR | Status: DC | PRN
  Administered 2020-03-30: 85 ug via INTRATHECAL
  Administered 2020-03-30: 15 ug via INTRATHECAL

## 2020-03-30 MED ORDER — PHENYLEPHRINE HCL-NACL 20-0.9 MG/250ML-% IV SOLN
INTRAVENOUS | Status: DC | PRN
  Administered 2020-03-30: 60 ug/min via INTRAVENOUS

## 2020-03-30 MED ORDER — ONDANSETRON HCL 4 MG/2ML IJ SOLN
INTRAMUSCULAR | Status: DC | PRN
  Administered 2020-03-30: 4 mg via INTRAVENOUS

## 2020-03-30 MED ORDER — DIPHENHYDRAMINE HCL 25 MG PO CAPS
25.0000 mg | ORAL_CAPSULE | Freq: Four times a day (QID) | ORAL | Status: DC | PRN
  Administered 2020-03-31 (×2): 25 mg via ORAL
  Filled 2020-03-30: qty 1

## 2020-03-30 MED ORDER — DIPHENHYDRAMINE HCL 25 MG PO CAPS
25.0000 mg | ORAL_CAPSULE | ORAL | Status: DC | PRN
  Administered 2020-03-30 – 2020-04-01 (×2): 25 mg via ORAL
  Filled 2020-03-30 (×3): qty 1

## 2020-03-30 MED ORDER — DIBUCAINE (PERIANAL) 1 % EX OINT: 1.0000 "application " | TOPICAL_OINTMENT | CUTANEOUS | Status: DC | PRN

## 2020-03-30 MED ORDER — SCOPOLAMINE 1 MG/3DAYS TD PT72
1.0000 | MEDICATED_PATCH | Freq: Once | TRANSDERMAL | Status: DC
  Administered 2020-03-30: 1.5 mg via TRANSDERMAL

## 2020-03-30 SURGICAL SUPPLY — 35 items
BENZOIN TINCTURE PRP APPL 2/3 (GAUZE/BANDAGES/DRESSINGS) IMPLANT
CHLORAPREP W/TINT 26ML (MISCELLANEOUS) ×2 IMPLANT
CLAMP CORD UMBIL (MISCELLANEOUS) IMPLANT
CLOTH BEACON ORANGE TIMEOUT ST (SAFETY) ×2 IMPLANT
DRSG OPSITE POSTOP 4X10 (GAUZE/BANDAGES/DRESSINGS) ×2 IMPLANT
ELECT REM PT RETURN 9FT ADLT (ELECTROSURGICAL) ×2
ELECTRODE REM PT RTRN 9FT ADLT (ELECTROSURGICAL) ×1 IMPLANT
EXTRACTOR VACUUM KIWI (MISCELLANEOUS) IMPLANT
EXTRACTOR VACUUM M CUP 4 TUBE (SUCTIONS) IMPLANT
GLOVE BIO SURGEON STRL SZ7.5 (GLOVE) ×2 IMPLANT
GLOVE BIOGEL PI IND STRL 7.0 (GLOVE) ×2 IMPLANT
GLOVE BIOGEL PI INDICATOR 7.0 (GLOVE) ×2
GOWN STRL REUS W/TWL 2XL LVL3 (GOWN DISPOSABLE) ×2 IMPLANT
GOWN STRL REUS W/TWL LRG LVL3 (GOWN DISPOSABLE) ×4 IMPLANT
HEMOSTAT ARISTA ABSORB 3G PWDR (HEMOSTASIS) ×2 IMPLANT
KIT ABG SYR 3ML LUER SLIP (SYRINGE) IMPLANT
NEEDLE HYPO 25X5/8 SAFETYGLIDE (NEEDLE) IMPLANT
NS IRRIG 1000ML POUR BTL (IV SOLUTION) ×2 IMPLANT
PACK C SECTION WH (CUSTOM PROCEDURE TRAY) ×2 IMPLANT
PAD ABD 7.5X8 STRL (GAUZE/BANDAGES/DRESSINGS) ×2 IMPLANT
PAD OB MATERNITY 4.3X12.25 (PERSONAL CARE ITEMS) ×2 IMPLANT
RTRCTR C-SECT PINK 25CM LRG (MISCELLANEOUS) ×2 IMPLANT
SPONGE GAUZE 4X4 12PLY STER LF (GAUZE/BANDAGES/DRESSINGS) ×4 IMPLANT
STRIP CLOSURE SKIN 1/2X4 (GAUZE/BANDAGES/DRESSINGS) IMPLANT
SUT PDS AB 0 CTX 36 PDP370T (SUTURE) IMPLANT
SUT PLAIN 0 NONE (SUTURE) IMPLANT
SUT PLAIN 2 0 (SUTURE)
SUT PLAIN ABS 2-0 CT1 27XMFL (SUTURE) IMPLANT
SUT VIC AB 0 CT1 36 (SUTURE) ×4 IMPLANT
SUT VIC AB 2-0 CT1 27 (SUTURE) ×4
SUT VIC AB 2-0 CT1 TAPERPNT 27 (SUTURE) ×4 IMPLANT
SUT VIC AB 4-0 KS 27 (SUTURE) ×2 IMPLANT
TOWEL OR 17X24 6PK STRL BLUE (TOWEL DISPOSABLE) ×2 IMPLANT
TRAY FOLEY W/BAG SLVR 14FR LF (SET/KITS/TRAYS/PACK) IMPLANT
WATER STERILE IRR 1000ML POUR (IV SOLUTION) ×2 IMPLANT

## 2020-03-30 NOTE — Anesthesia Preprocedure Evaluation (Addendum)
Anesthesia Evaluation  Patient identified by MRN, date of birth, ID band Patient awake    Reviewed: Allergy & Precautions, NPO status , Patient's Chart, lab work & pertinent test results  Airway Mallampati: II  TM Distance: >3 FB Neck ROM: Full    Dental no notable dental hx. (+) Teeth Intact   Pulmonary neg pulmonary ROS,    Pulmonary exam normal breath sounds clear to auscultation       Cardiovascular Normal cardiovascular exam+ dysrhythmias  Rhythm:Regular Rate:Normal  Tachycardia with pregnancy   Neuro/Psych  Headaches, negative psych ROS   GI/Hepatic Neg liver ROS, GERD  ,  Endo/Other  Obesity  Renal/GU negative Renal ROS  negative genitourinary   Musculoskeletal negative musculoskeletal ROS (+)   Abdominal (+) + obese,   Peds  Hematology  (+) anemia , Thrombocytopenia- mild 120k   Anesthesia Other Findings   Reproductive/Obstetrics (+) Pregnancy                             Anesthesia Physical Anesthesia Plan  ASA: II  Anesthesia Plan: Spinal   Post-op Pain Management:    Induction:   PONV Risk Score and Plan: 4 or greater and Scopolamine patch - Pre-op  Airway Management Planned: Natural Airway  Additional Equipment:   Intra-op Plan:   Post-operative Plan:   Informed Consent: I have reviewed the patients History and Physical, chart, labs and discussed the procedure including the risks, benefits and alternatives for the proposed anesthesia with the patient or authorized representative who has indicated his/her understanding and acceptance.     Dental advisory given  Plan Discussed with: CRNA and Anesthesiologist  Anesthesia Plan Comments:         Anesthesia Quick Evaluation

## 2020-03-30 NOTE — Discharge Summary (Signed)
Postpartum Discharge Summary      Patient Name: Mia Wilkinson DOB: 1985-09-18 MRN: [id number]  Date of admission: 03/30/2020 Delivery date:03/30/2020  Delivering provider: Juanna Cao T  Date of discharge: 04/01/2020  Admitting diagnosis: Labor and delivery, indication for care [O75.9] Intrauterine pregnancy: [redacted]w[redacted]d    Secondary diagnosis:  Active Problems:   History of cesarean delivery, currently pregnant   Maternal obesity affecting pregnancy, antepartum   Unwanted fertility   Labor and delivery, indication for care  Additional problems: None    Discharge diagnosis: Term Pregnancy Delivered                                              Post partum procedures: None Augmentation: N/A Complications: None  Hospital course: Sceduled C/S   34y.o. yo [id number] admitted to the hospital 03/30/2020 for scheduled cesarean section with the following indication: Elective Repeat (5 prior c-sections) and patient also with PROM the morning of her section. Delivery details are as follows:  Membrane Rupture Time/Date: 5:45 AM ,03/30/2020   Delivery Method:C-Section, Low Transverse  Details of operation can be found in separate operative note. Bilateral salpingectomy done. Patient had an uncomplicated postpartum course.  She is ambulating, tolerating a regular diet, passing flatus, and urinating well. Patient is discharged home in stable condition on  04/01/20        Newborn Data: Birth date:03/30/2020  Birth time:10:03 AM  Gender:Female  Living status:Living  Apgars:9 ,9  Weight:2846 g     Magnesium Sulfate received: No BMZ received: No Rhophylac:N/A MMR:N/A T-DaP:declined Flu: No Transfusion:No  Physical exam  Vitals:   03/31/20 1444 03/31/20 2053 03/31/20 2104 04/01/20 0500  BP: (!) 114/62 107/74 110/70 108/68  Pulse: 86 85 90 88  Resp: _0 Temp: 98.9 F (37.2 C) 98 F (36.7 C) 98 F (36.7 C) 98 F (36.7 C)  TempSrc: Oral  Oral Oral  SpO2: 100%  91% 95%   Weight:      Height:       General: alert, cooperative and no distress Lochia: appropriate Uterine Fundus: firm Incision: Dressing is clean, dry, and intact DVT Evaluation: No evidence of DVT seen on physical exam. Labs: Lab Results  Component Value Date   WBC 10.3 03/31/2020   HGB 9.2 (L) 03/31/2020   HCT 31.1 (L) 03/31/2020   MCV 85.7 03/31/2020   PLT 130 (L) 03/31/2020   CMP Latest Ref Rng & Units 03/31/2020  Glucose 70 - 99 mg/dL -  BUN 6 - 20 mg/dL -  Creatinine 0.44 - 1.00 mg/dL 0.56  Sodium 135 - 145 mmol/L -  Potassium 3.5 - 5.1 mmol/L -  Chloride 98 - 111 mmol/L -  CO2 22 - 32 mmol/L -  Calcium 8.9 - 10.3 mg/dL -  Total Protein 6.5 - 8.1 g/dL -  Total Bilirubin 0.3 - 1.2 mg/dL -  Alkaline Phos 38 - 126 U/L -  AST 15 - 41 U/L -  ALT 0 - 44 U/L -   Edinburgh Score: Edinburgh Postnatal Depression Scale Screening Tool 03/31/2020  I have been able to laugh and see the funny side of things. 0  I have looked forward with enjoyment to things. 0  I have blamed myself unnecessarily when things went wrong. 0  I have been anxious or worried for no good  reason. 0  I have felt scared or panicky for no good reason. 0  Things have been getting on top of me. 0  I have been so unhappy that I have had difficulty sleeping. 0  I have felt sad or miserable. 0  I have been so unhappy that I have been crying. 0  The thought of harming myself has occurred to me. 0  Edinburgh Postnatal Depression Scale Total 0     After visit meds:  Allergies as of 04/01/2020   No Known Allergies     Medication List    STOP taking these medications   aspirin EC 81 MG tablet   Butalbital-APAP-Caffeine 50-325-40 MG capsule   potassium chloride 10 MEQ tablet Commonly known as: KLOR-CON   terconazole 0.4 % vaginal cream Commonly known as: Terazol 7     TAKE these medications   acetaminophen 500 MG tablet Commonly known as: TYLENOL Take 2 tablets (1,000 mg total) by mouth every  8 (eight) hours as needed for mild pain (temperature > 101.5.). What changed:   medication strength  how much to take  when to take this  reasons to take this   Blood Pressure Kit Devi 1 Device by Does not apply route as needed.   FerrouSul 325 (65 FE) MG tablet Generic drug: ferrous sulfate Take 1 tablet (325 mg total) by mouth every other day.   ibuprofen 600 MG tablet Commonly known as: ADVIL Take 1 tablet (600 mg total) by mouth every 6 (six) hours as needed for fever or headache.   oxyCODONE 5 MG immediate release tablet Commonly known as: Oxy IR/ROXICODONE Take 1-2 tablets (5-10 mg total) by mouth every 4 (four) hours as needed for moderate pain.   zolpidem 5 MG tablet Commonly known as: AMBIEN Take 1 tablet (5 mg total) by mouth at bedtime as needed for sleep.        Discharge home in stable condition Infant Feeding: Bottle Infant Disposition:home with mother Discharge instruction: per After Visit Summary and Postpartum booklet. Activity: Advance as tolerated. Pelvic rest for 6 weeks.  Diet: routine diet Future Appointments: Future Appointments  Date Time Provider [place]  04/06/2020  1:40 PM [place] None  04/28/2020  2:30 PM Cephas Darby, MD [place] None   Follow up Visit:   Please schedule this patient for a Virtual postpartum visit in 4 weeks with the following provider: Any provider. Additional Postpartum F/U:Incision check 1 week  Low risk pregnancy complicated by: none Delivery mode:  C-Section, Low Transverse  Anticipated Birth Control:  BTL done PP   04/01/2020 Tephanie Escorcia L Erby Sanderson, DO

## 2020-03-30 NOTE — Transfer of Care (Signed)
Immediate Anesthesia Transfer of Care Note  Patient: ARRIYANNA MERSCH  Procedure(s) Performed: CESAREAN SECTION WITH BILATERAL TUBAL LIGATION (Bilateral Abdomen)  Patient Location: PACU  Anesthesia Type:Spinal and Epidural  Level of Consciousness: awake, alert  and oriented  Airway & Oxygen Therapy: Patient Spontanous Breathing  Post-op Assessment: Report given to RN and Post -op Vital signs reviewed and stable  Post vital signs: Reviewed and stable  Last Vitals:  Vitals Value Taken Time  BP 107/66 03/30/20 1137  Temp    Pulse 71 03/30/20 1142  Resp 15 03/30/20 1142  SpO2 100 % 03/30/20 1142  Vitals shown include unvalidated device data.  Last Pain:  Vitals:   03/30/20 0748  TempSrc: Oral  PainSc:          Complications: No complications documented.

## 2020-03-30 NOTE — Op Note (Addendum)
Mia Wilkinson PROCEDURE DATE: 03/30/2020  PREOPERATIVE DIAGNOSES: Intrauterine pregnancy at [redacted]w[redacted]d weeks gestation; elective repeat with 5 prior c-sections; undesired fertility  POSTOPERATIVE DIAGNOSES: The same  PROCEDURE: Repeat Low Transverse Cesarean Section, Bilateral Tubal Salpingectomy  SURGEONS:  Dr. Raynelle Dick - Primary Dr. Jerilynn Birkenhead - Fellow Dr. Casper Harrison - Fellow  ANESTHESIOLOGIST: Dr. Malen Gauze  INDICATIONS: Mia Wilkinson is a 34 y.o. [phone] at [redacted]w[redacted]d here for cesarean section and bilateral tubal sterilization secondary to the indications listed under preoperative diagnoses; please see preoperative note for further details.  The risks of surgery were discussed with the patient including but were not limited to: bleeding which may require transfusion or reoperation; infection which may require antibiotics; injury to bowel, bladder, ureters or other surrounding organs; injury to the fetus; need for additional procedures including hysterectomy in the event of a life-threatening hemorrhage; formation of adhesions; placental abnormalities wth subsequent pregnancies; incisional problems; thromboembolic phenomenon and other postoperative/anesthesia complications.  Patient also desires permanent sterilization.  Other reversible forms of contraception were discussed with patient; she declines all other modalities.   Risks of sterilization procedure discussed with patient including but not limited to: risk of regret, permanence of method, bleeding, infection, injury to surrounding organs and need for additional procedures.  Failure risk of about 1% with increased risk of ectopic gestation if pregnancy occurs was also discussed with patient.  Also discussed possibility of post-tubal pain syndrome. The patient concurred with the proposed plan, giving informed written consent for the procedures.  FINDINGS:  Viable female infant in cephalic presentation. Clear amniotic fluid.  Intact  placenta, three vessel cord.  Normal uterus, fallopian tubes and ovaries bilaterally. Fallopian tubes were resected bilaterally. Overall paucity of adhesions. APGAR (1 MIN): 9 APGAR (5 MINS): 9  APGAR (10 MINS):        ANESTHESIA: CSE INTRAVENOUS FLUIDS: 1200 ml ESTIMATED BLOOD LOSS: 383 ml URINE OUTPUT:  550 ml SPECIMENS: Placenta sent to L&D and bilateral fallopian tubes sent to pathology COMPLICATIONS: None immediate  PROCEDURE IN DETAIL:  The patient preoperatively received intravenous antibiotics and had sequential compression devices applied to her lower extremities.   She was then taken to the operating room where spinal anesthesia was administered and was found to be adequate. She was then placed in a dorsal supine position with a leftward tilt, and prepped and draped in a sterile manner.  A foley catheter was placed into her bladder and attached to constant gravity.  After an adequate timeout was performed, a Pfannenstiel skin incision was made with scalpel over her preexisting scar and carried through to the underlying layer of fascia. The fascia was incised in the midline, and this incision was extended bilaterally using blunt traction.  Kocher clamps were applied to the superior aspect of the fascial incision and the underlying rectus muscles were dissected off bluntly.  The rectus muscles were separated in the midline and the peritoneum was entered bluntly. The Alexis self-retaining retractor was introduced into the abdominal cavity.  Attention was turned to the lower uterine segment where a bladder flap was created and then a low transverse hysterotomy was made with a scalpel and extended bilaterally bluntly.  The infant was successfully delivered, the cord was clamped and cut after one minute and the infant was handed over to the awaiting neonatology team. Uterine massage was then administered, and the placenta delivered intact with a three-vessel cord. The uterus was then cleared of clots  and debris.  The hysterotomy was closed with 0  Vicryl in a running locked fashion.  Figure-of-eight 0 Vicryl serosal stitches were placed to help with hemostasis. Attention was then turned to the fallopian tubes, where the left fallopian tube was identified and grasped with a Babcock clamp, and followed out to the fimbriated end. This tube was serially clamped doubly ligated and excised allowing for salpingectomy. A similar process was carried out on the right side allowing for bilateral tubal salpingectomy.  Good hemostasis was noted overall. The pelvis was cleared of all clot and debris. Hemostasis was confirmed on all surfaces.  The retractor was removed. The fascia was then closed using 0 PDS in a running fashion.  The subcutaneous layer was irrigated and the skin was closed with a 4-0 Vicryl subcuticular stitch. The patient tolerated the procedure well. Sponge, instrument and needle counts were correct x 3.  She was taken to the recovery room in stable condition.   Jerilynn Birkenhead, MD [facility] Family Medicine Fellow, [facility] for [employer], [facility]

## 2020-03-30 NOTE — Anesthesia Procedure Notes (Signed)
Spinal  Patient location during procedure: OR Start time: 03/30/2020 9:37 AM End time: 03/30/2020 9:43 AM Staffing Performed: anesthesiologist  Anesthesiologist: Mal Amabile, MD Preanesthetic Checklist Completed: patient identified, IV checked, site marked, risks and benefits discussed, surgical consent, monitors and equipment checked, pre-op evaluation and timeout performed Spinal Block Patient position: sitting Prep: DuraPrep and site prepped and draped Patient monitoring: cardiac monitor, continuous pulse ox, blood pressure and heart rate Approach: midline Location: L4-5 Injection technique: catheter Needle Needle type: Tuohy and Spinocan  Needle gauge: 25 G Needle length: 12.7 cm Needle insertion depth: 7 cm Catheter type: closed end flexible Catheter size: 19 g Catheter at skin depth: 12 cm Assessment Sensory level: T4 Additional Notes Epidural performed using LOR with air technique. No CSF, Heme or paresthesias. SAB performed through the epidural needle using 24ga Spinocan needle. CSF clear with free flow and no paresthesias. Local anesthetic and narcotics injected through the spinal needle and withdrawn. Epidural catheter threaded 5cm into the epidural space and the epidural needle was withdrawn. A sterile dressing was applied and the patient placed supine with LUD. The patient tolerated the procedure well and adequate sensory level was obtained.

## 2020-03-30 NOTE — Anesthesia Postprocedure Evaluation (Signed)
Anesthesia Post Note  Patient: Mia Wilkinson  Procedure(s) Performed: CESAREAN SECTION WITH BILATERAL TUBAL LIGATION (Bilateral Abdomen)     Patient location during evaluation: PACU Anesthesia Type: Spinal Level of consciousness: oriented and awake and alert Pain management: pain level controlled Vital Signs Assessment: post-procedure vital signs reviewed and stable Respiratory status: spontaneous breathing, respiratory function stable and nonlabored ventilation Cardiovascular status: blood pressure returned to baseline and stable Postop Assessment: no headache, no backache, no apparent nausea or vomiting, spinal receding and patient able to bend at knees Anesthetic complications: no   No complications documented.  Last Vitals:  Vitals:   03/30/20 1245 03/30/20 1258  BP: 108/65 (!) 104/47  Pulse:    Resp: 15 18  Temp:  37.3 C  SpO2:  99%    Last Pain:  Vitals:   03/30/20 1244  TempSrc:   PainSc: 6    Pain Goal:    LLE Motor Response: Purposeful movement (03/30/20 1230) LLE Sensation: Numbness (03/30/20 1230) RLE Motor Response: Purposeful movement (03/30/20 1230) RLE Sensation: Numbness (03/30/20 1230)     Epidural/Spinal Function Cutaneous sensation: Vague (03/30/20 1230), Patient able to flex knees: Yes (03/30/20 1230), Patient able to lift hips off bed: No (03/30/20 1230), Back pain beyond tenderness at insertion site: No (03/30/20 1230), Progressively worsening motor and/or sensory loss: No (03/30/20 1230), Bowel and/or bladder incontinence post epidural: No (03/30/20 1230)  Monti Jilek A.

## 2020-03-30 NOTE — MAU Note (Signed)
Report given to Marrion Coy and [facility] Anesthesia. Marrion Coy advised that patient was ok to come to PACU for pre op prep.

## 2020-03-30 NOTE — Progress Notes (Signed)
1230 Epidural catheter removed per Dr. Malen Gauze order-tip intact.

## 2020-03-30 NOTE — H&P (Signed)
Obstetric Preoperative History and Physical  Mia Wilkinson is a 34 y.o. [id number] with IUP at 42w0dpresenting for scheduled cesarean section.  Reports good fetal movement, no bleeding, no contractions. Water did break at 0545 this morning.  No acute preoperative concerns.    Cesarean Section Indication: elective repeat (5 prior c-sections) with bilateral sterilization  Prenatal Course Source of Care: [facility]  Pregnancy complications or risks: Patient Active Problem List   Diagnosis Date Noted  . Unwanted fertility 02/11/2020  . Headache in pregnancy, antepartum, third trimester 01/08/2020  . Maternal obesity affecting pregnancy, antepartum 11/14/2019  . Frequent headaches 11/14/2019  . Supervision of high risk pregnancy, antepartum 09/09/2019  . Hernia, ventral 09/09/2019  . History of cesarean delivery, currently pregnant 12/18/2013   She plans to bottle feed She desires bilateral tubal ligation for postpartum contraception.   Prenatal labs and studies: ABO, Rh: --/--/A POS (07/24 0900) Antibody: NEG (07/24 0900) Rubella: 1.10 (01/14 1408) RPR: NON REACTIVE (07/24 0900)  HBsAg: Negative (01/14 1408)  HIV: Non Reactive (05/05 0944)  GPIR:JJOACZYS/- (07/06 1112) 2 hr Glucola  WNL Genetic screening normal Anatomy UKoreanormal  Prenatal Transfer Tool  Maternal Diabetes: No Genetic Screening: Normal Maternal Ultrasounds/Referrals: Normal Fetal Ultrasounds or other Referrals:  None Maternal Substance Abuse:  No Significant Maternal Medications:  None Significant Maternal Lab Results: Group B Strep negative  Past Medical History:  Diagnosis Date  . Anemia   . Dysrhythmia    tachycardia with pregnancy  . GERD (gastroesophageal reflux disease)    with pregnancy    Past Surgical History:  Procedure Laterality Date  . CESAREAN SECTION     x 3  . CESAREAN SECTION N/A 12/18/2013   Procedure: Repeat CESAREAN SECTION;  Surgeon: BFrederico Hamman MD;  [facility];   Service: Obstetrics;  Laterality: N/A;  . CESAREAN SECTION N/A 01/13/2015   Procedure: REPEAT CESAREAN SECTION;  Surgeon: BFrederico Hamman MD;  [facility];  Service: Obstetrics;  Laterality: N/A;  . LIPOSUCTION  05/02/2019    OB History  Gravida Para Term Preterm AB Living  6 5 5  0 0 5  SAB TAB Ectopic Multiple Live Births  0 0 0 0 5    # Outcome Date GA Lbr Len/2nd Weight Sex Delivery Anes PTL Lv  6 Current           5 Term 01/13/15 [id number] g M CS-LTranv Spinal  LIV  4 Term 12/18/13 3967w1d715 g M CS-LVertical Spinal  LIV  3 Term 2012 39w39w0d78 g M CS-LTranv     2 Term 2009 39w073w0d5 g M CS-LTranv   LIV  1 Term 2006 55w0d72w0d g M CS-LTranv   LIV    Social History   Socioeconomic History  . Marital status: Single    Spouse name: Not on file  . Number of children: Not on file  . Years of education: Not on file  . Highest education level: Not on file  Occupational History  . Not on file  Tobacco Use  . Smoking status: Never Smoker  . Smokeless tobacco: Never Used  Vaping Use  . Vaping Use: Never used  Substance and Sexual Activity  . Alcohol use: No  . Drug use: No  . Sexual activity: Yes    Birth control/protection: None  Other Topics Concern  . Not on file  Social History Narrative  . Not on file   Social Determinants of Health  Financial Resource Strain:   . Difficulty of Paying Living Expenses:   Food Insecurity:   . Worried About Charity fundraiser in the Last Year:   . Arboriculturist in the Last Year:   Transportation Needs:   . Film/video editor (Medical):   Marland Kitchen Lack of Transportation (Non-Medical):   Physical Activity:   . Days of Exercise per Week:   . Minutes of Exercise per Session:   Stress:   . Feeling of Stress :   Social Connections:   . Frequency of Communication with Friends and Family:   . Frequency of Social Gatherings with Friends and Family:   . Attends Religious Services:   . Active Member of Clubs or  Organizations:   . Attends Archivist Meetings:   Marland Kitchen Marital Status:     Family History  Problem Relation Age of Onset  . Hypertension Mother     Medications Prior to Admission  Medication Sig Dispense Refill Last Dose  . aspirin EC 81 MG tablet Take 1 tablet (81 mg total) by mouth daily. Take after 12 weeks for prevention of preeclampsia later in pregnancy 300 tablet 2 Past Week at Unknown time  . Blood Pressure Monitoring (BLOOD PRESSURE KIT) DEVI 1 Device by Does not apply route as needed. 1 each 0   . Butalbital-APAP-Caffeine 50-325-40 MG capsule Take 1-2 capsules by mouth every 6 (six) hours as needed for headache. (Patient not taking: Reported on 03/17/2020) 20 capsule 1   . FERROUSUL 325 (65 Fe) MG tablet Take 1 tablet (325 mg total) by mouth every other day. 45 tablet 3   . potassium chloride (KLOR-CON) 10 MEQ tablet Take 1 tablet (10 mEq total) by mouth daily for 4 days. (Patient not taking: Reported on 03/17/2020) 4 tablet 0   . Prenatal MV-Min-FA-Omega-3 (PRENATAL GUMMIES/DHA & FA) 0.4-32.5 MG CHEW Chew 1 tablet by mouth daily.      Marland Kitchen terconazole (TERAZOL 7) 0.4 % vaginal cream Place 1 applicator vaginally at bedtime. (Patient not taking: Reported on 01/28/2020) 45 g 0     No Known Allergies  Review of Systems: Pertinent items noted in HPI and remainder of comprehensive ROS otherwise negative.  Physical Exam: BP 111/77   Pulse 96   Temp 98.1 F (36.7 C) (Oral)   Resp 18   Wt 88 kg   LMP 07/01/2019   SpO2 100%   BMI 34.37 kg/m  FHR by Doppler: 150 bpm CONSTITUTIONAL: Well-developed, well-nourished female in no acute distress.  HENT:  Normocephalic, atraumatic, External right and left ear normal. Oropharynx is clear and moist EYES: Conjunctivae and EOM are normal. No scleral icterus.  NECK: Normal range of motion, supple, no masses SKIN: Skin is warm and dry. No rash noted. Not diaphoretic. No erythema. No pallor. [place]: Alert and oriented to person,  place, and time. Normal reflexes, muscle tone coordination. No cranial nerve deficit noted. PSYCHIATRIC: Normal mood and affect. Normal behavior. Normal judgment and thought content. CARDIOVASCULAR: Normal heart rate noted RESPIRATORY: Effort and breath sounds normal, no problems with respiration noted ABDOMEN: Soft, nontender, nondistended, gravid. Well-healed Pfannenstiel incision, appears to have several scars a few cm apart but no keloiding. PELVIC: Deferred MUSCULOSKELETAL: Normal range of motion. No edema and no tenderness. 2+ distal pulses.   Pertinent Labs/Studies:   Results for orders placed or performed during the hospital encounter of 03/30/20 (from the past 72 hour(s))  Fern Test     Status: Abnormal   Collection Time:  03/30/20  7:11 AM  Result Value Ref Range   POCT Fern Test Positive = ruptured amniotic membanes (A)   Fern Test     Status: None   Collection Time: 03/30/20  7:44 AM  Result Value Ref Range   POCT Fern Test Positive = ruptured amniotic membanes   Prepare RBC (crossmatch)     Status: None   Collection Time: 03/30/20  8:01 AM  Result Value Ref Range   Order Confirmation      ORDER PROCESSED BY BLOOD BANK Performed at [facility], [place] [address], [place], [place] [address]     Assessment and Plan: NADJA LINA is a 34 y.o. [id number] at 3w0dbeing admitted for scheduled cesarean section. The risks of cesarean section were discussed with the patient including but were not limited to: bleeding which may require transfusion or reoperation; infection which may require antibiotics; injury to bowel, bladder, ureters or other surrounding organs; injury to the fetus; need for additional procedures including hysterectomy in the event of a life-threatening hemorrhage; formation of adhesions; placental abnormalities wth subsequent pregnancies; incisional problems; thromboembolic phenomenon and other postoperative/anesthesia complications.  Patient also desires  permanent sterilization.  Other reversible forms of contraception were discussed with patient; she declines all other modalities. This will be done either via bilateral salpingectomy or bilateral application of Filshie clips.  Risks of procedure discussed with patient including but not limited to: risk of regret, permanence of method, bleeding, infection, injury to surrounding organs and need for additional procedures.  Failure risk of about 1% with increased risk of ectopic gestation if pregnancy occurs was also discussed with patient.  Also discussed possibility of post-tubal pain syndrome. The patient concurred with the proposed plan, giving informed written consent for the procedures.  Patient has been NPO since midnight she will remain NPO for procedure. Anesthesia and OR aware.  Preoperative prophylactic antibiotics and SCDs ordered on call to the OR.  To OR when ready.  Pregnancy Complications:   - 5 prior c-sections: 2 units of blood ordered, Anesthesia planning CSE. Also consented for possible hysterectomy. Reports that she was told the previous c-sections were uncomplicated. Contraception: Consented for possible salpingectomy if able. Also informed of possible ties vs clips. Circumcision: NA; girl MOF: Bottle   CBarrington Ellison MD OB Family Medicine Fellow, [facility] [place]

## 2020-03-30 NOTE — MAU Note (Signed)
Patient reports SROM of clear fluid around 0545.  Denies VB.  Endorses + FM.  RCS scheduled today w/ BTL.

## 2020-03-31 LAB — CREATININE, SERUM
Creatinine, Ser: 0.56 mg/dL (ref 0.44–1.00)
GFR calc Af Amer: >60 mL/min (ref >60)
GFR calc non Af Amer: >60 mL/min (ref >60)

## 2020-03-31 LAB — CBC
HCT: 31.1 % — ABNORMAL LOW (ref 36.0–46.0)
Hemoglobin: 9.2 g/dL — ABNORMAL LOW (ref 12.0–15.0)
MCH: 25.3 pg — ABNORMAL LOW (ref 26.0–34.0)
MCHC: 29.6 g/dL — ABNORMAL LOW (ref 30.0–36.0)
MCV: 85.7 fL (ref 80.0–100.0)
Platelets: 130 10*3/uL — ABNORMAL LOW (ref 150–400)
RBC: 3.63 MIL/uL — ABNORMAL LOW (ref 3.87–5.11)
RDW: 20.5 % — ABNORMAL HIGH (ref 11.5–15.5)
WBC: 10.3 10*3/uL (ref 4.0–10.5)
nRBC: 0 % (ref 0.0–0.2)

## 2020-03-31 LAB — SURGICAL PATHOLOGY

## 2020-03-31 MED ORDER — FERROUS SULFATE 325 (65 FE) MG PO TABS
325.0000 mg | ORAL_TABLET | ORAL | Status: DC
  Administered 2020-03-31: 325 mg via ORAL
  Filled 2020-03-31: qty 1

## 2020-03-31 NOTE — Progress Notes (Signed)
Walked with patient in the hallway, tolerated well.

## 2020-03-31 NOTE — Progress Notes (Signed)
POSTPARTUM PROGRESS NOTE  Subjective: Mia Wilkinson is a 34 y.o. [phone] on POD#1 s/p rLTCS at [redacted]w[redacted]d.  She reports she doing well. No acute events overnight. She denies any problems with ambulating or po intake. She has not voided yet. Denies nausea or vomiting. She has not passed flatus. Pain is well controlled.  Lochia is appropriate.  Objective: Blood pressure (!) 92/57, pulse 57, temperature 98.9 F (37.2 C), temperature source Oral, resp. rate 18, weight 88 kg, last menstrual period 07/01/2019, SpO2 100 %, unknown if currently breastfeeding.  Physical Exam:  General: alert, cooperative and no distress Chest: no respiratory distress Abdomen: soft, non-tender; dressing dry, clean and intact Uterine Fundus: firm, appropriately tender Extremities: No calf swelling or tenderness  No edema  Recent Labs    03/28/20 0900 03/31/20 0505  HGB 10.8* 9.2*  HCT 36.5 31.1*    Assessment/Plan: Mia Wilkinson is a 34 y.o. [id number] on POD#1 s/p rLTCS at [redacted]w[redacted]d.  Routine Postpartum Care: Doing well, pain well-controlled.  -- Continue routine care, lactation support  -- Contraception: BTL, done during surgery -- Feeding: bottle -- Hgb 10.8>9.2: PO iron  Dispo: Plan for discharge tomorrow.  Marlowe Alt, DO OB/GYN Fellow, [facility] for [facility]

## 2020-04-01 LAB — TYPE AND SCREEN
ABO/RH(D): A POS
Antibody Screen: NEGATIVE
Unit division: 0
Unit division: 0

## 2020-04-01 LAB — BPAM RBC
Blood Product Expiration Date: 202108182359
Blood Product Expiration Date: 202108182359
ISSUE DATE / TIME: 202107260808
ISSUE DATE / TIME: 202107260808
Unit Type and Rh: 6200
Unit Type and Rh: 6200

## 2020-04-01 MED ORDER — ZOLPIDEM TARTRATE 5 MG PO TABS: 5.0000 mg | ORAL_TABLET | Freq: Every evening | ORAL | 0 refills | Status: DC | PRN

## 2020-04-01 MED ORDER — OXYCODONE HCL 5 MG PO TABS: 5.0000 mg | ORAL_TABLET | ORAL | 0 refills | Status: DC | PRN

## 2020-04-01 MED ORDER — IBUPROFEN 600 MG PO TABS: 600.0000 mg | ORAL_TABLET | Freq: Four times a day (QID) | ORAL | 0 refills | Status: DC | PRN

## 2020-04-01 MED ORDER — ACETAMINOPHEN 500 MG PO TABS: 1000.0000 mg | ORAL_TABLET | Freq: Three times a day (TID) | ORAL | 0 refills | Status: DC | PRN

## 2020-04-06 ENCOUNTER — Ambulatory Visit: Payer: Medicaid Other

## 2020-04-15 ENCOUNTER — Ambulatory Visit: Payer: Medicaid Other

## 2020-04-17 ENCOUNTER — Ambulatory Visit: Payer: Medicaid Other

## 2020-04-17 ENCOUNTER — Other Ambulatory Visit: Payer: Self-pay

## 2020-04-17 VITALS — BP 130/88 | HR 70 | Wt 181.0 lb

## 2020-04-17 DIAGNOSIS — Z98891 History of uterine scar from previous surgery: Secondary | ICD-10-CM

## 2020-04-17 DIAGNOSIS — O34219 Maternal care for unspecified type scar from previous cesarean delivery: Secondary | ICD-10-CM

## 2020-04-17 MED ORDER — IBUPROFEN 800 MG PO TABS: 800.0000 mg | ORAL_TABLET | Freq: Three times a day (TID) | ORAL | 2 refills | Status: DC | PRN

## 2020-04-17 NOTE — Progress Notes (Signed)
Post Partum patient presents for Incision Check today.  Repeat Low Transverse Cesarean Section, Bilateral Tubal Salpingectomy on 03/30/20. Pt notes pain on  Left side of incision and noted hard area, no bleeding, no drainage, no odor Would like refill on pain medication  ASSESSMENT: Incision does not appear red, no drainage, no signs of infection consulted w/provider on left area pt noted to be hard.   PLAN: Keep scheduled post partum appt. Will Refill Ibuprofen 800 mg. Patient advised any increase in pain, bleeding, swelling to report to [facility].  Pt agreeable and voiced understanding.

## 2020-04-28 ENCOUNTER — Telehealth (INDEPENDENT_AMBULATORY_CARE_PROVIDER_SITE_OTHER): Payer: Medicaid Other | Admitting: Obstetrics and Gynecology

## 2020-04-28 DIAGNOSIS — Z5329 Procedure and treatment not carried out because of patient's decision for other reasons: Secondary | ICD-10-CM

## 2020-04-28 NOTE — Progress Notes (Signed)
Pt did not answer nurse call x2, no option for voicemail.

## 2020-05-05 ENCOUNTER — Telehealth: Payer: Medicaid Other | Admitting: Obstetrics and Gynecology

## 2020-07-07 ENCOUNTER — Telehealth: Payer: Self-pay | Admitting: Registered Nurse

## 2020-07-07 NOTE — Telephone Encounter (Signed)
Pt is needing a referral sent in  again about a hernia that pt was diagnosed with back in 07/31/19 by provider. Please advise.

## 2020-07-08 NOTE — Telephone Encounter (Signed)
Will patient need an appointment for re-evaluation or can she have the same referral sent in for hernia evaluation , last ordered 07/2019.

## 2020-07-22 NOTE — Telephone Encounter (Signed)
Pt is calling back regarding the hernia referral. Pt stated it is causing issues and would like to have surgery. Pt is really wanting this soon. Please advise.

## 2020-07-23 ENCOUNTER — Other Ambulatory Visit: Payer: Self-pay | Admitting: Registered Nurse

## 2020-07-23 DIAGNOSIS — K439 Ventral hernia without obstruction or gangrene: Secondary | ICD-10-CM

## 2020-07-23 NOTE — Telephone Encounter (Signed)
There was a misunderstanding on my end - my apologies. She will be called within the next few days  Thank you  Luan Pulling

## 2020-07-23 NOTE — Telephone Encounter (Signed)
I see this referral was sent to you already but I did not see a response. Please advise on referral

## 2020-08-02 DIAGNOSIS — M542 Cervicalgia: Secondary | ICD-10-CM | POA: Diagnosis not present

## 2020-08-02 DIAGNOSIS — S39012A Strain of muscle, fascia and tendon of lower back, initial encounter: Secondary | ICD-10-CM | POA: Diagnosis not present

## 2020-08-07 ENCOUNTER — Ambulatory Visit: Payer: Self-pay | Admitting: Registered Nurse

## 2020-08-10 ENCOUNTER — Encounter: Payer: Self-pay | Admitting: General Practice

## 2020-08-10 ENCOUNTER — Encounter: Payer: Self-pay | Admitting: Registered Nurse

## 2020-08-26 ENCOUNTER — Ambulatory Visit (INDEPENDENT_AMBULATORY_CARE_PROVIDER_SITE_OTHER): Payer: Medicaid Other | Admitting: Family Medicine

## 2020-08-26 ENCOUNTER — Encounter: Payer: Self-pay | Admitting: Family Medicine

## 2020-08-26 ENCOUNTER — Other Ambulatory Visit: Payer: Self-pay

## 2020-08-26 VITALS — BP 106/75 | HR 88 | Temp 97.8°F | Ht 63.0 in | Wt 201.0 lb

## 2020-08-26 DIAGNOSIS — S0990XD Unspecified injury of head, subsequent encounter: Secondary | ICD-10-CM

## 2020-08-26 DIAGNOSIS — G44309 Post-traumatic headache, unspecified, not intractable: Secondary | ICD-10-CM | POA: Diagnosis not present

## 2020-08-26 DIAGNOSIS — S0990XS Unspecified injury of head, sequela: Secondary | ICD-10-CM

## 2020-08-26 MED ORDER — BUTALBITAL-APAP-CAFFEINE 50-325-40 MG PO TABS: 1.0000 | ORAL_TABLET | Freq: Four times a day (QID) | ORAL | 0 refills | Status: AC | PRN

## 2020-08-26 MED ORDER — BUTALBITAL-ASPIRIN-CAFFEINE 50-325-40 MG PO CAPS: 2.0000 | ORAL_CAPSULE | Freq: Two times a day (BID) | ORAL | 0 refills | Status: DC | PRN

## 2020-08-26 MED ORDER — CYCLOBENZAPRINE HCL 5 MG PO TABS: 5.0000 mg | ORAL_TABLET | Freq: Every day | ORAL | 0 refills | Status: AC
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
## 2020-08-26 NOTE — Patient Instructions (Addendum)
   Post-Concussion Syndrome  Post-concussion syndrome is when symptoms last longer than normal after a head injury. What are the signs or symptoms? After a head injury, you may:  Have headaches.  Feel tired.  Feel dizzy.  Feel weak.  Have trouble seeing.  Have trouble in bright lights.  Have trouble hearing.  Not be able to remember things.  Not be able to focus.  Have trouble sleeping.  Have mood swings.  Have trouble learning new things. These can last from weeks to months. Follow these instructions at home: Medicines  Take all medicines only as told by your doctor.  Do not take prescription pain medicines. Activity  Limit activities as told by your doctor. This includes: ? Homework. ? Job-related work. ? Thinking. ? Watching TV. ? Using a computer or phone. ? Puzzles. ? Exercise. ? Sports.  Slowly return to your normal activity as told by your doctor.  Stop an activity if you have symptoms.  Do not do anything that may cause you to get injured again. General instructions  Rest. Try to: ? Sleep 7-9 hours each night. ? Take naps or breaks when you feel tired during the day.  Do not drink alcohol until your doctor says that you can.  Keep track of your symptoms.  Keep all follow-up visits as told by your doctor. This is important. Contact a doctor if:  You do not improve.  You get worse.  You have another injury. Get help right away if:  You have a very bad headache.  You feel confused.  You feel very sleepy.  You pass out (faint).  You throw up (vomit).  You feel weak in any part of your body.  You feel numb in any part of your body.  You start shaking (have a seizure).  You have trouble talking. Summary  Post-concussion syndrome is when symptoms last longer than normal after a head injury.  Limit all activity after your injury. Gradually return to normal activity as told by your doctor.  Rest, do not drink alcohol, and  avoid prescription pain medicines after a concussion.  Call your doctor if your symptoms get worse. This information is not intended to replace advice given to you by your health care provider. Make sure you discuss any questions you have with your health care provider. Document Revised: 06/14/2018 Document Reviewed: 09/26/2017 Elsevier Patient Education  [employer].  If you have lab work done today you will be contacted with your lab results within the next 2 weeks.  If you have not heard from [place] then please contact us. The fastest way to get your results is to register for My Chart.   IF you received an x-ray today, you will receive an invoice from [facility]. Please contact [facility] at [phone] with questions or concerns regarding your invoice.   IF you received labwork today, you will receive an invoice from [facility]. Please contact [facility] at [phone] with questions or concerns regarding your invoice.   Our billing staff will not be able to assist you with questions regarding bills from these companies.  You will be contacted with the lab results as soon as they are available. The fastest way to get your results is to activate your My Chart account. Instructions are located on the last page of this paperwork. If you have not heard from [place] regarding the results in 2 weeks, please contact this office.

## 2020-09-09 ENCOUNTER — Inpatient Hospital Stay: Admission: RE | Admit: 2020-09-09 | Payer: Medicaid Other | Source: Ambulatory Visit

## 2020-10-06 ENCOUNTER — Ambulatory Visit: Payer: Medicaid Other | Admitting: Registered Nurse

## 2020-10-07 ENCOUNTER — Encounter: Payer: Self-pay | Admitting: Registered Nurse

## 2020-10-09 ENCOUNTER — Telehealth: Payer: Self-pay | Admitting: Registered Nurse

## 2020-10-09 NOTE — Telephone Encounter (Signed)
Received call from [facility] to confirm receipt of medical records request they sent via fax in January. Sharol Harness will refax request to our fax number of [phone]. Please advise at [phone]. Reference record ID: [id number]

## 2020-10-20 ENCOUNTER — Encounter: Payer: Self-pay | Admitting: Registered Nurse

## 2020-10-20 ENCOUNTER — Other Ambulatory Visit (HOSPITAL_COMMUNITY)
Admission: RE | Admit: 2020-10-20 | Discharge: 2020-10-20 | Disposition: A | Discharge status: Home / Self Care | Payer: Medicaid Other | Source: Ambulatory Visit | Attending: Registered Nurse | Admitting: Registered Nurse

## 2020-10-20 ENCOUNTER — Other Ambulatory Visit: Payer: Self-pay

## 2020-10-20 ENCOUNTER — Ambulatory Visit (INDEPENDENT_AMBULATORY_CARE_PROVIDER_SITE_OTHER): Payer: Medicaid Other | Admitting: Registered Nurse

## 2020-10-20 VITALS — BP 112/79 | HR 89 | Temp 98.0°F | Resp 18 | Ht 63.0 in | Wt 207.0 lb

## 2020-10-20 DIAGNOSIS — R350 Frequency of micturition: Secondary | ICD-10-CM | POA: Diagnosis not present

## 2020-10-20 DIAGNOSIS — N898 Other specified noninflammatory disorders of vagina: Secondary | ICD-10-CM

## 2020-10-20 LAB — POCT URINALYSIS DIP (CLINITEK)
Bilirubin, UA: NEGATIVE
Glucose, UA: NEGATIVE mg/dL
Ketones, POC UA: NEGATIVE mg/dL
Leukocytes, UA: NEGATIVE
Nitrite, UA: NEGATIVE
POC PROTEIN,UA: NEGATIVE
Spec Grav, UA: >=1.03 — AB (ref 1.010–1.025)
Urobilinogen, UA: 0.2 E.U./dL
pH, UA: 5.5 (ref 5.0–8.0)

## 2020-10-20 MED ORDER — FLUCONAZOLE 150 MG PO TABS: 150.0000 mg | ORAL_TABLET | Freq: Once | ORAL | 0 refills | Status: AC

## 2020-10-20 NOTE — Progress Notes (Signed)
Acute Office Visit  Subjective:    Patient ID: Mia Wilkinson, female    DOB: 1986/05/28, 35 y.o.   MRN: [id number]  Chief Complaint  Patient presents with  . Urinary Tract Infection    Patient states she has been having some frequent urination and also some itching and a tingling sensation when she is done urinating.    HPI Patient is in today for frequent urination  Ongoing 2-3 days Some itching and tingling in vulva usually after voiding No discharge. Had odor, now resolved. No pelvic pain, flank pain, or gross hematuria No new sexual partners or known exposure to STI  Otherwise no concerns.   Past Medical History:  Diagnosis Date  . Anemia   . Dysrhythmia    tachycardia with pregnancy  . GERD (gastroesophageal reflux disease)    with pregnancy    Past Surgical History:  Procedure Laterality Date  . CESAREAN SECTION     x 3  . CESAREAN SECTION N/A 12/18/2013   Procedure: Repeat CESAREAN SECTION;  Surgeon: Frederico Hamman, MD;  Location: [facility];  Service: Obstetrics;  Laterality: N/A;  . CESAREAN SECTION N/A 01/13/2015   Procedure: REPEAT CESAREAN SECTION;  Surgeon: Frederico Hamman, MD;  Location: [facility];  Service: Obstetrics;  Laterality: N/A;  . CESAREAN SECTION WITH BILATERAL TUBAL LIGATION Bilateral 03/30/2020   Procedure: CESAREAN SECTION WITH BILATERAL TUBAL LIGATION;  Surgeon: Cherre Blanc, MD;  Location: [facility];  Service: Obstetrics;  Laterality: Bilateral;  . LIPOSUCTION  05/02/2019    Family History  Problem Relation Age of Onset  . Hypertension Mother     Social History   Socioeconomic History  . Marital status: Single    Spouse name: Not on file  . Number of children: Not on file  . Years of education: Not on file  . Highest education level: Not on file  Occupational History  . Not on file  Tobacco Use  . Smoking status: Never Smoker  . Smokeless tobacco: Never Used  Vaping Use  . Vaping Use: Never used  Substance  and Sexual Activity  . Alcohol use: No  . Drug use: No  . Sexual activity: Yes    Birth control/protection: None  Other Topics Concern  . Not on file  Social History Narrative  . Not on file   Social Determinants of Health   Financial Resource Strain: Not on file  Food Insecurity: Not on file  Transportation Needs: Not on file  Physical Activity: Not on file  Stress: Not on file  Social Connections: Not on file  Intimate Partner Violence: Not on file    Outpatient Medications Prior to Visit  Medication Sig Dispense Refill  . acetaminophen (TYLENOL) 325 MG tablet Take 650 mg by mouth every 6 (six) hours as needed. (Patient not taking: Reported on 10/20/2020)    . aspirin-acetaminophen-caffeine (EXCEDRIN MIGRAINE) 250-250-65 MG tablet Take by mouth every 6 (six) hours as needed for headache. (Patient not taking: Reported on 10/20/2020)    . Blood Pressure Monitoring (BLOOD PRESSURE KIT) DEVI 1 Device by Does not apply route as needed. (Patient not taking: Reported on 10/20/2020) 1 each 0  . butalbital-acetaminophen-caffeine (FIORICET) 50-325-40 MG tablet Take 1-2 tablets by mouth every 6 (six) hours as needed for headache. (Patient not taking: Reported on 10/20/2020) 20 tablet 0  . FERROUSUL 325 (65 Fe) MG tablet Take 1 tablet (325 mg total) by mouth every other day. 45 tablet 3   No facility-administered  medications prior to visit.    No Known Allergies  Review of Systems Per hpi  Otherwise negative     Objective:    Physical Exam Vitals and nursing note reviewed.  Constitutional:      General: She is not in acute distress.    Appearance: Normal appearance. She is not ill-appearing, toxic-appearing or diaphoretic.  Cardiovascular:     Rate and Rhythm: Normal rate and regular rhythm.     Pulses: Normal pulses.     Heart sounds: Normal heart sounds. No murmur heard. No friction rub. No gallop.   Pulmonary:     Effort: Pulmonary effort is normal. No respiratory distress.      Breath sounds: Normal breath sounds. No stridor. No wheezing, rhonchi or rales.  Chest:     Chest wall: No tenderness.  Genitourinary:    Comments: Pt declines Skin:    General: Skin is warm and dry.     Capillary Refill: Capillary refill takes less than 2 seconds.  Neurological:     General: No focal deficit present.     Mental Status: She is alert and oriented to person, place, and time. Mental status is at baseline.  Psychiatric:        Mood and Affect: Mood normal.        Behavior: Behavior normal.        Thought Content: Thought content normal.        Judgment: Judgment normal.     BP 112/79   Pulse 89   Temp 98 F (36.7 C) (Temporal)   Resp 18   Ht 5' 3"  (1.6 m)   Wt 207 lb (93.9 kg)   SpO2 100%   BMI 36.67 kg/m  Wt Readings from Last 3 Encounters:  10/20/20 207 lb (93.9 kg)  08/26/20 201 lb (91.2 kg)  04/17/20 181 lb (82.1 kg)    There are no preventive care reminders to display for this patient.  There are no preventive care reminders to display for this patient.   Lab Results  Component Value Date   TSH 1.557 02/24/2020   Lab Results  Component Value Date   WBC 10.3 03/31/2020   HGB 9.2 (L) 03/31/2020   HCT 31.1 (L) 03/31/2020   MCV 85.7 03/31/2020   PLT 130 (L) 03/31/2020   Lab Results  Component Value Date   NA 134 (L) 03/15/2020   K 3.0 (L) 03/15/2020   CO2 18 (L) 03/15/2020   GLUCOSE 112 (H) 03/15/2020   BUN 7 03/15/2020   CREATININE 0.56 03/31/2020   BILITOT 0.2 (L) 03/15/2020   ALKPHOS 82 03/15/2020   AST 14 (L) 03/15/2020   ALT 14 03/15/2020   PROT 6.8 03/15/2020   ALBUMIN 2.5 (L) 03/15/2020   CALCIUM 8.4 (L) 03/15/2020   ANIONGAP 12 03/15/2020   No results found for: CHOL No results found for: HDL No results found for: LDLCALC No results found for: TRIG No results found for: CHOLHDL Lab Results  Component Value Date   HGBA1C 5.6 09/19/2019       Assessment & Plan:   Problem List Items Addressed This Visit    None   Visit Diagnoses    Frequent urination    -  Primary   Relevant Orders   POCT URINALYSIS DIP (CLINITEK) (Completed)   Urine cytology ancillary only   Urine Culture   Vaginal itching       Relevant Medications   fluconazole (DIFLUCAN) 150 MG tablet  Meds ordered this encounter  Medications  . fluconazole (DIFLUCAN) 150 MG tablet    Sig: Take 1 tablet (150 mg total) by mouth once for 1 dose.    Dispense:  1 tablet    Refill:  0    Order Specific Question:   Supervising Provider    Answer:   Carlota Raspberry, JEFFREY R [[id number]]   PLAN  Pt declines wet prep / self swab  Will sent urine culture and gc/ct/trich testing  poct ua wnl  Will give diflucan for itching  Follow up if symptoms persist  Patient encouraged to call clinic with any questions, comments, or concerns.   Maximiano Coss, NP

## 2020-10-20 NOTE — Patient Instructions (Signed)
° ° ° °  If you have lab work done today you will be contacted with your lab results within the next 2 weeks.  If you have not heard from us then please contact us. The fastest way to get your results is to register for My Chart. ° ° °IF you received an x-ray today, you will receive an invoice from [facility]. Please contact [facility] at [phone] with questions or concerns regarding your invoice.  ° °IF you received labwork today, you will receive an invoice from [facility]. Please contact [facility] at [phone] with questions or concerns regarding your invoice.  ° °Our billing staff will not be able to assist you with questions regarding bills from these companies. ° °You will be contacted with the lab results as soon as they are available. The fastest way to get your results is to activate your My Chart account. Instructions are located on the last page of this paperwork. If you have not heard from us regarding the results in 2 weeks, please contact this office. °  ° ° ° °

## 2020-10-21 LAB — URINE CYTOLOGY ANCILLARY ONLY
Chlamydia: NEGATIVE
Comment: NEGATIVE
Comment: NEGATIVE
Comment: NORMAL
Neisseria Gonorrhea: NEGATIVE
Trichomonas: NEGATIVE

## 2020-10-21 LAB — URINE CULTURE

## 2020-10-22 DIAGNOSIS — K429 Umbilical hernia without obstruction or gangrene: Secondary | ICD-10-CM | POA: Diagnosis not present

## 2020-12-29 ENCOUNTER — Ambulatory Visit: Payer: Medicaid Other

## 2020-12-31 ENCOUNTER — Ambulatory Visit: Payer: Medicaid Other | Admitting: Registered Nurse

## 2021-01-03 IMAGING — US US MFM OB DETAIL+14 WK
1 series · 13 of 28 positions shown · non-contrast
Comparison: none

[Series 1: us mfm ob detail+14 wk · 13 of 70 slices shown]
[im 3/70]
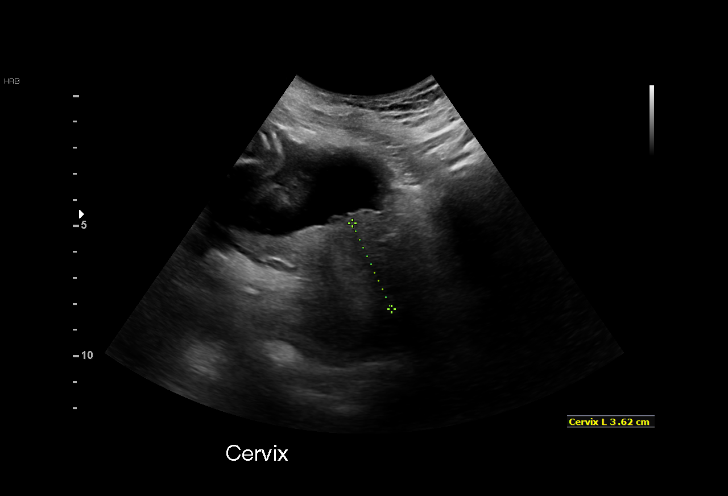
[im 8/70]
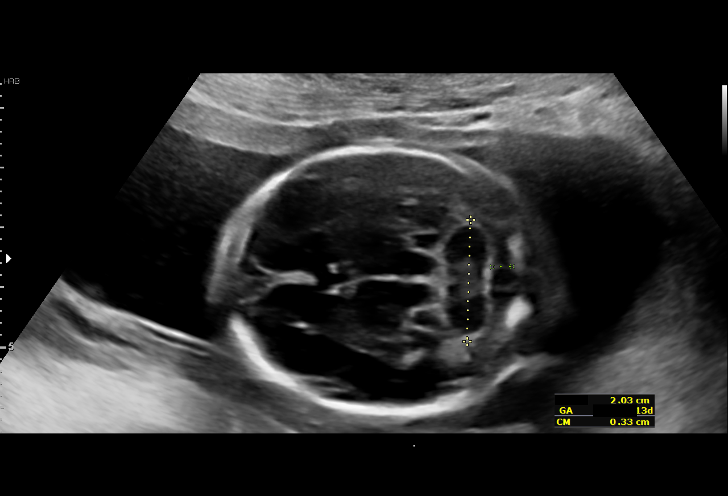
[im 13/70]
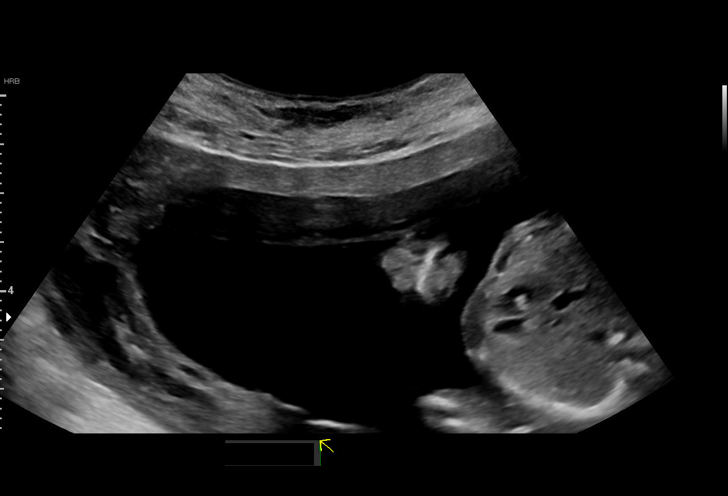
[im 18/70]
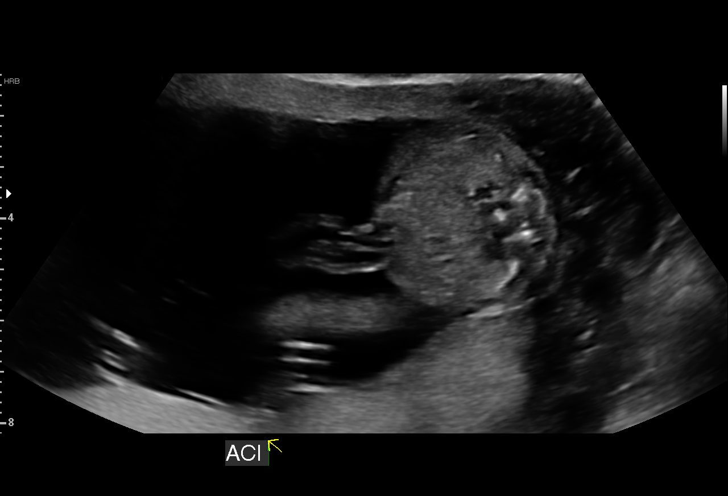
[im 24/70]
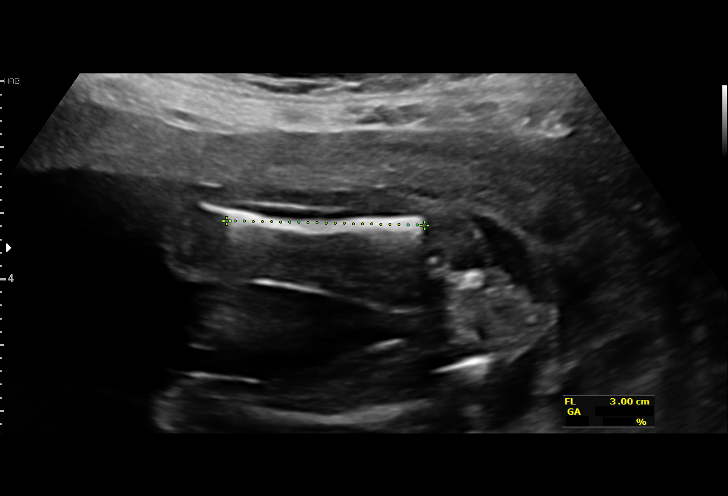
[im 29/70]
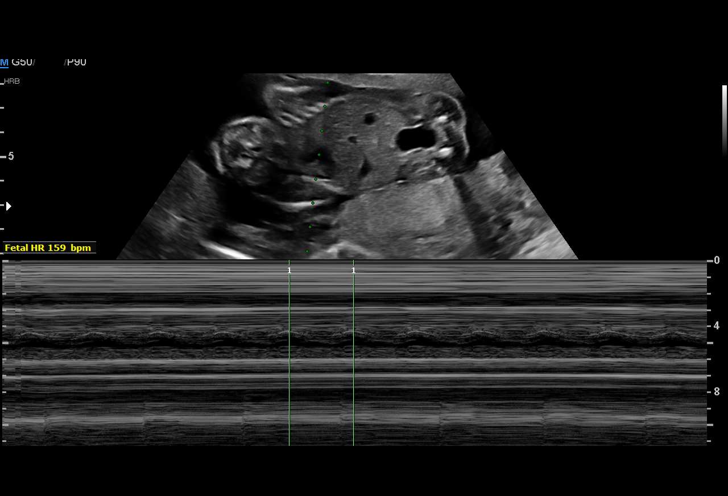
[im 36/70]
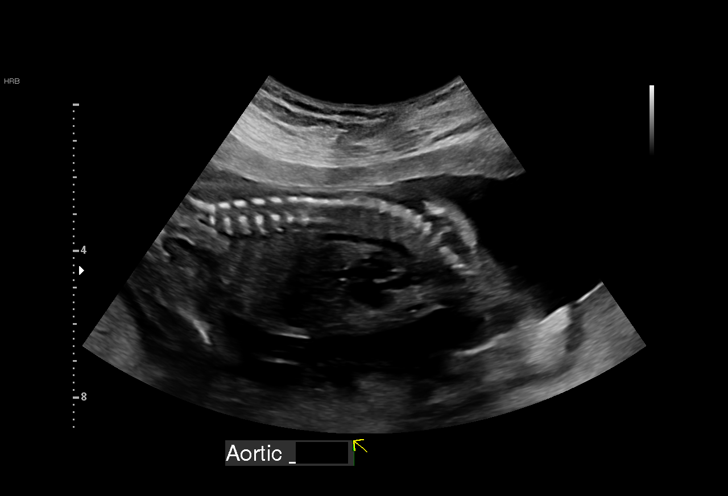
[im 41/70]
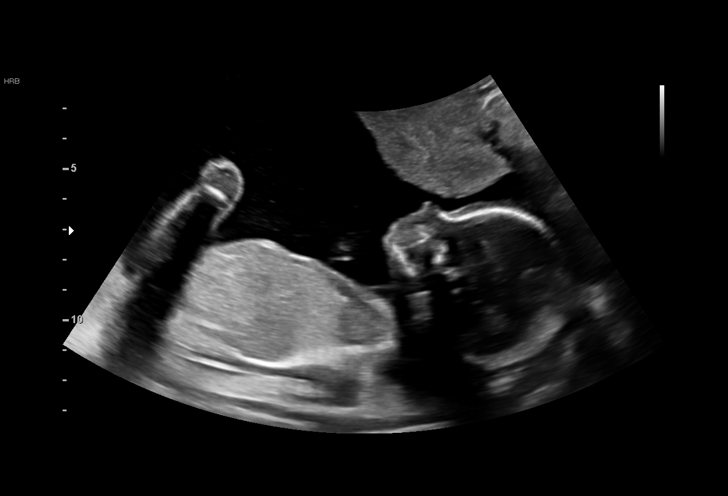
[im 47/70]
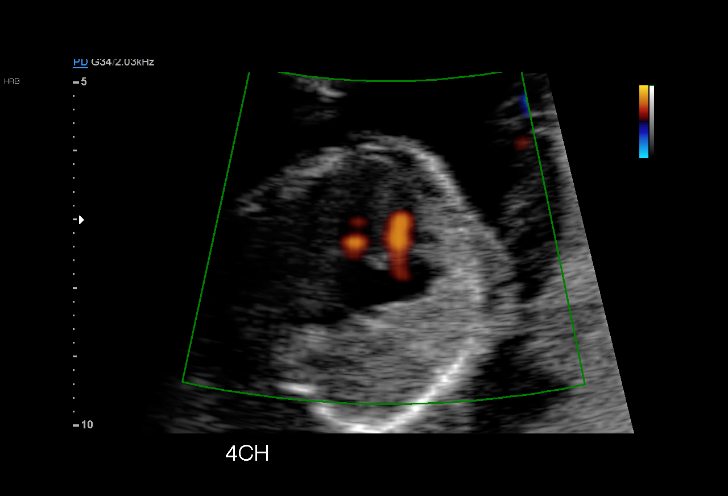
[im 52/70]
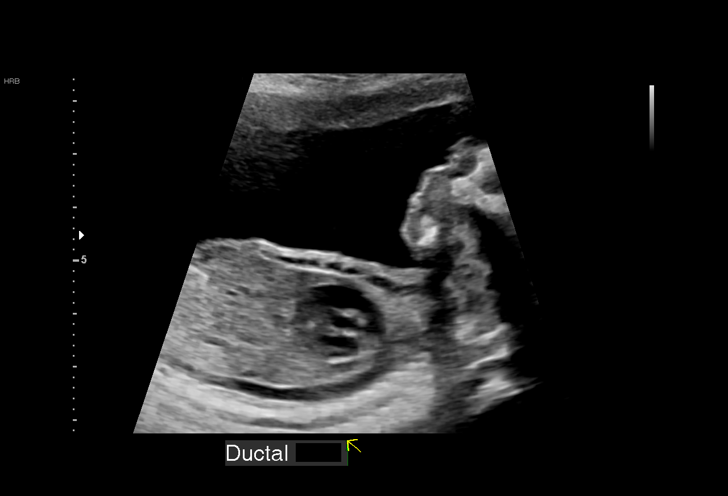
[im 57/70]
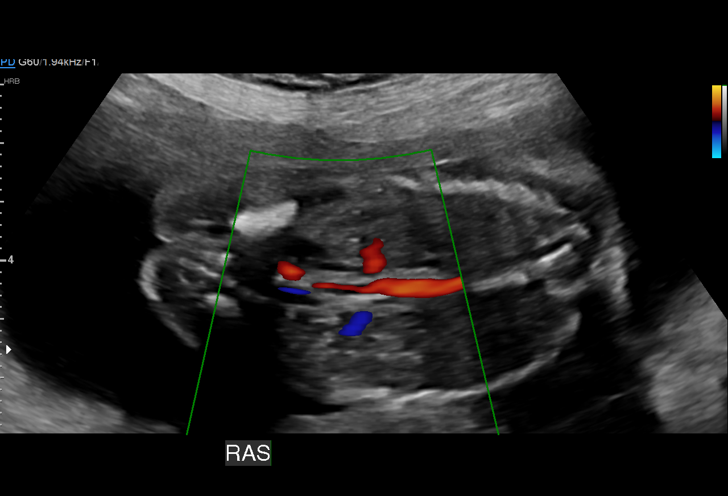
[im 62/70]
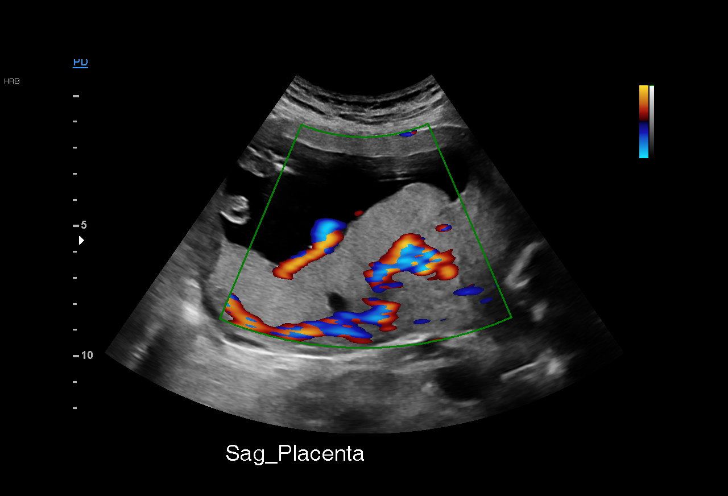
[im 67/70]
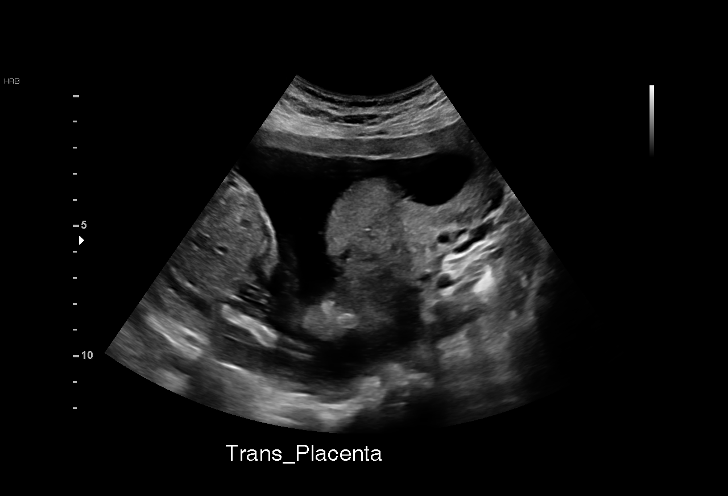

[13 of 28 positions shown; findings below may reference images not displayed]

CNM

 ----------------------------------------------------------------------

 ----------------------------------------------------------------------
Indications

  Obesity complicating pregnancy, second
  trimester
  Encounter for antenatal screening for
  malformations
  History of cesarean delivery, currently
  pregnant x5
  19 weeks gestation of pregnancy
 ----------------------------------------------------------------------
Vital Signs

 BMI:
Fetal Evaluation

 Num Of Fetuses:         1
 Fetal Heart Rate(bpm):  159
 Cardiac Activity:       Observed
 Presentation:           Variable
 Placenta:               Posterior
 P. Cord Insertion:      Visualized, central

 Amniotic Fluid
 AFI FV:      Within normal limits

                             Largest Pocket(cm)

Biometry

 BPD:      43.3  mm     G. Age:  19w 1d         49  %    CI:        76.54   %    70 - 86
                                                         FL/HC:      19.3   %    16.1 -
 HC:      156.8  mm     G. Age:  18w 4d         18  %    HC/AC:      1.11        1.09 -
 AC:      140.8  mm     G. Age:  19w 3d         56  %    FL/BPD:     69.7   %
 FL:       30.2  mm     G. Age:  19w 2d         50  %    FL/AC:      21.4   %    20 - 24
 CER:      20.1  mm     G. Age:  19w 1d         49  %

 LV:        5.8  mm
 CM:        3.2  mm

 Est. FW:     285  gm    0 lb 10 oz      55  %
OB History

 Gravidity:    6         Term:   5
 Living:       5
Gestational Age

 LMP:           19w 1d        Date:  07/01/19                 EDD:   04/06/20
 U/S Today:     19w 1d                                        EDD:   04/06/20
 Best:          19w 1d     Det. By:  LMP  (07/01/19)          EDD:   04/06/20
Anatomy

 Cranium:               Appears normal         Aortic Arch:            Appears normal
 Cavum:                 Appears normal         Ductal Arch:            Appears normal
 Ventricles:            Appears normal         Diaphragm:              Appears normal
 Choroid Plexus:        Appears normal         Stomach:                Appears normal, left
                                                                       sided
 Cerebellum:            Appears normal         Abdomen:                Appears normal
 Posterior Fossa:       Appears normal         Abdominal Wall:         Appears nml (cord
                                                                       insert, abd wall)
 Nuchal Fold:           Appears normal         Cord Vessels:           Appears normal (3
                                                                       vessel cord)
 Face:                  Appears normal         Kidneys:                Appear normal
                        (orbits and profile)
 Lips:                  Appears normal         Bladder:                Appears normal
 Thoracic:              Appears normal         Spine:                  Appears normal
 Heart:                 Appears normal         Upper Extremities:      Appears normal
                        (4CH, axis, and
                        situs)
 RVOT:                  Appears normal         Lower Extremities:      Appears normal
 LVOT:                  Appears normal

 Other:  Heels and 5th digit visualized. Nasal bone visualized. Open hands
         visualized.
Cervix Uterus Adnexa

 Cervix
 Length:           3.62  cm.
 Normal appearance by transabdominal scan.

 Uterus
 No abnormality visualized.

 Left Ovary
 No adnexal mass visualized.

 Right Ovary
 No adnexal mass visualized.

 Cul De Sac
 No free fluid seen.
 Adnexa
 No abnormality visualized.
Comments

 This patient was seen for a detailed fetal anatomy scan due
 to maternal obesity. She denies any significant past medical
 history and denies any problems in her current pregnancy.
 She had a cell free DNA test earlier in her pregnancy which
 indicated a low risk for trisomy 21, 18, and 13. A female fetus
 is predicted.
 She was informed that the fetal growth and amniotic fluid
 level were appropriate for her gestational age.
 There were no obvious fetal anomalies noted on today's
 ultrasound exam.
 The patient was informed that anomalies may be missed due
 to technical limitations. If the fetus is in a suboptimal position
 or maternal habitus is increased, visualization of the fetus in
 the maternal uterus may be impaired.
 Follow up as indicated.

## 2021-01-05 ENCOUNTER — Ambulatory Visit: Payer: Medicaid Other | Admitting: Registered Nurse

## 2021-01-14 ENCOUNTER — Encounter: Payer: Self-pay | Admitting: Registered Nurse

## 2021-01-14 ENCOUNTER — Other Ambulatory Visit: Payer: Self-pay

## 2021-01-14 ENCOUNTER — Ambulatory Visit (INDEPENDENT_AMBULATORY_CARE_PROVIDER_SITE_OTHER): Payer: Medicaid Other | Admitting: Registered Nurse

## 2021-01-14 VITALS — BP 102/72 | HR 74 | Temp 98.0°F | Resp 18 | Ht 63.0 in | Wt 205.8 lb

## 2021-01-14 DIAGNOSIS — E611 Iron deficiency: Secondary | ICD-10-CM | POA: Diagnosis not present

## 2021-01-14 DIAGNOSIS — K5904 Chronic idiopathic constipation: Secondary | ICD-10-CM | POA: Diagnosis not present

## 2021-01-14 MED ORDER — POLYETHYLENE GLYCOL 3350 POWD: 17.0000 g | Freq: Every day | 2 refills | Status: AC | PRN

## 2021-01-14 MED ORDER — PSYLLIUM 0.52 G PO CAPS: 0.5200 g | ORAL_CAPSULE | Freq: Every day | ORAL | 3 refills | Status: AC

## 2021-01-14 MED ORDER — FERROUSUL 325 (65 FE) MG PO TABS: 325.0000 mg | ORAL_TABLET | ORAL | 3 refills | Status: AC

## 2021-01-14 NOTE — Patient Instructions (Addendum)
Ms. Dutta -  As always, good to see you. The plan for today:  Psyllium Husk (Fiber supplement): I have sent this to your pharmacy in capsule form. It appears to be covered. Please take 1-2 capsules daily with a large glass of water.   Detox Tea: okay to use this once daily or as instructed by package contents.   B12: please pick up an over the counter supplement. This may help with anemia and constipation  Iron: I have sent refills. Please take 1 tablet every other day. It may be wise to take this with something acidic like orange juice to help absorb the iron and keep it from contributing to constipation.  Back up plan: use MiraLax one scoop once daily in your choice of beverage as needed.  Nonmedicinal: Stay active, stay very well hydrated, and don't spend too long sitting on the toilet - try not to force things, let them come naturally. I want to make sure you don't put yourself at risk for hemorrhoids.    See you in a couple of weeks for pre-op  Thank you  Rich     If you have lab work done today you will be contacted with your lab results within the next 2 weeks.  If you have not heard from [place] then please contact us. The fastest way to get your results is to register for My Chart.   IF you received an x-ray today, you will receive an invoice from [facility]. Please contact [facility] at [phone] with questions or concerns regarding your invoice.   IF you received labwork today, you will receive an invoice from [place]. Please contact [facility] at [phone] with questions or concerns regarding your invoice.   Our billing staff will not be able to assist you with questions regarding bills from these companies.  You will be contacted with the lab results as soon as they are available. The fastest way to get your results is to activate your My Chart account. Instructions are located on the last page of this paperwork. If you have not heard from [place]  regarding the results in 2 weeks, please contact this office.

## 2021-01-14 NOTE — Progress Notes (Signed)
Established Patient Office Visit  Subjective:  Patient ID: Mia Wilkinson, female    DOB: 1986-06-15  Age: 35 y.o. MRN: [id number]  CC:  Chief Complaint  Patient presents with  . Constipation    Patient states she is has been constipated and have been taking multiple laxatives with no relief at this time. Patient also want to discuss iron and potassium     HPI [facility] presents for constipation  Ongoing. Worsening Feels as though she has to force bowel movements daily When she does pass stool they are generally soft. No hard lumps. No blood or mucus in stool. Denies NV.  No weight changes of concern No substantial changes to diet or exercise. No rectal pain or swelling  Has tried dulcolax OTC without relief. Also tried a hemp tea that didn't help.   Notes she is out of iron supplement. Would like refill. Is planning to return to the office in 2 weeks for a pre-op visit.  Otherwise feeling well.  Past Medical History:  Diagnosis Date  . Anemia   . Dysrhythmia    tachycardia with pregnancy  . GERD (gastroesophageal reflux disease)    with pregnancy    Past Surgical History:  Procedure Laterality Date  . CESAREAN SECTION     x 3  . CESAREAN SECTION N/A 12/18/2013   Procedure: Repeat CESAREAN SECTION;  Surgeon: Frederico Hamman, MD;  Location: [place] [facility];  Service: Obstetrics;  Laterality: N/A;  . CESAREAN SECTION N/A 01/13/2015   Procedure: REPEAT CESAREAN SECTION;  Surgeon: Frederico Hamman, MD;  Location: [place] [facility];  Service: Obstetrics;  Laterality: N/A;  . CESAREAN SECTION WITH BILATERAL TUBAL LIGATION Bilateral 03/30/2020   Procedure: CESAREAN SECTION WITH BILATERAL TUBAL LIGATION;  Surgeon: Cherre Blanc, MD;  Location: [facility];  Service: Obstetrics;  Laterality: Bilateral;  . LIPOSUCTION  05/02/2019    Family History  Problem Relation Age of Onset  . Hypertension Mother     Social History   Socioeconomic History  . Marital  status: Single    Spouse name: Not on file  . Number of children: Not on file  . Years of education: Not on file  . Highest education level: Not on file  Occupational History  . Not on file  Tobacco Use  . Smoking status: Never Smoker  . Smokeless tobacco: Never Used  Vaping Use  . Vaping Use: Never used  Substance and Sexual Activity  . Alcohol use: No  . Drug use: No  . Sexual activity: Yes    Birth control/protection: None  Other Topics Concern  . Not on file  Social History Narrative  . Not on file   Social Determinants of Health   Financial Resource Strain: Not on file  Food Insecurity: Not on file  Transportation Needs: Not on file  Physical Activity: Not on file  Stress: Not on file  Social Connections: Not on file  Intimate Partner Violence: Not on file    Outpatient Medications Prior to Visit  Medication Sig Dispense Refill  . acetaminophen (TYLENOL) 325 MG tablet Take 650 mg by mouth every 6 (six) hours as needed. (Patient not taking: Reported on 01/14/2021)    . aspirin-acetaminophen-caffeine (EXCEDRIN MIGRAINE) 250-250-65 MG tablet Take by mouth every 6 (six) hours as needed for headache. (Patient not taking: Reported on 01/14/2021)    . Blood Pressure Monitoring (BLOOD PRESSURE KIT) DEVI 1 Device by Does not apply route as needed. (Patient not taking: Reported on  01/14/2021) 1 each 0  . butalbital-acetaminophen-caffeine (FIORICET) 50-325-40 MG tablet Take 1-2 tablets by mouth every 6 (six) hours as needed for headache. (Patient not taking: Reported on 01/14/2021) 20 tablet 0  . FERROUSUL 325 (65 Fe) MG tablet Take 1 tablet (325 mg total) by mouth every other day. 45 tablet 3   No facility-administered medications prior to visit.    No Known Allergies  ROS Review of Systems  Constitutional: Negative.   HENT: Negative.   Eyes: Negative.   Respiratory: Negative.   Cardiovascular: Negative.   Gastrointestinal: Positive for constipation.  Endocrine: Negative.    Genitourinary: Negative.   Musculoskeletal: Negative.   Skin: Negative.   Allergic/Immunologic: Negative.   Neurological: Negative.   Hematological: Negative.   Psychiatric/Behavioral: Negative.   All other systems reviewed and are negative.     Objective:    Physical Exam Vitals and nursing note reviewed.  Constitutional:      General: She is not in acute distress.    Appearance: Normal appearance. She is normal weight. She is not ill-appearing, toxic-appearing or diaphoretic.  Cardiovascular:     Rate and Rhythm: Normal rate and regular rhythm.     Heart sounds: Normal heart sounds. No murmur heard. No friction rub. No gallop.   Pulmonary:     Effort: Pulmonary effort is normal. No respiratory distress.     Breath sounds: Normal breath sounds. No stridor. No wheezing, rhonchi or rales.  Chest:     Chest wall: No tenderness.  Skin:    General: Skin is warm and dry.  Neurological:     General: No focal deficit present.     Mental Status: She is alert and oriented to person, place, and time. Mental status is at baseline.  Psychiatric:        Mood and Affect: Mood normal.        Behavior: Behavior normal.        Thought Content: Thought content normal.        Judgment: Judgment normal.     BP 102/72   Pulse 74   Temp 98 F (36.7 C) (Temporal)   Resp 18   Ht 5' 3"  (1.6 m)   Wt 205 lb 12.8 oz (93.4 kg)   SpO2 99%   BMI 36.46 kg/m  Wt Readings from Last 3 Encounters:  01/14/21 205 lb 12.8 oz (93.4 kg)  10/20/20 207 lb (93.9 kg)  08/26/20 201 lb (91.2 kg)     Health Maintenance Due  Topic Date Due  . PAP SMEAR-Modifier  03/05/2021    There are no preventive care reminders to display for this patient.  Lab Results  Component Value Date   TSH 1.557 02/24/2020   Lab Results  Component Value Date   WBC 10.3 03/31/2020   HGB 9.2 (L) 03/31/2020   HCT 31.1 (L) 03/31/2020   MCV 85.7 03/31/2020   PLT 130 (L) 03/31/2020   Lab Results  Component Value  Date   NA 134 (L) 03/15/2020   K 3.0 (L) 03/15/2020   CO2 18 (L) 03/15/2020   GLUCOSE 112 (H) 03/15/2020   BUN 7 03/15/2020   CREATININE 0.56 03/31/2020   BILITOT 0.2 (L) 03/15/2020   ALKPHOS 82 03/15/2020   AST 14 (L) 03/15/2020   ALT 14 03/15/2020   PROT 6.8 03/15/2020   ALBUMIN 2.5 (L) 03/15/2020   CALCIUM 8.4 (L) 03/15/2020   ANIONGAP 12 03/15/2020   No results found for: CHOL No results found for: HDL No  results found for: LDLCALC No results found for: TRIG No results found for: CHOLHDL Lab Results  Component Value Date   HGBA1C 5.6 09/19/2019      Assessment & Plan:   Problem List Items Addressed This Visit   None   Visit Diagnoses    Chronic idiopathic constipation    -  Primary   Relevant Medications   psyllium (REGULOID) 0.52 g capsule   Polyethylene Glycol 3350 POWD   Iron deficiency       Relevant Medications   FERROUSUL 325 (65 Fe) MG tablet      Meds ordered this encounter  Medications  . psyllium (REGULOID) 0.52 g capsule    Sig: Take 1-2 capsules (0.52-1.04 g total) by mouth daily.    Dispense:  180 capsule    Refill:  3    Order Specific Question:   Supervising Provider    Answer:   Carlota Raspberry, JEFFREY R [[id number]]  . Polyethylene Glycol 3350 POWD    Sig: 17 g by Does not apply route daily as needed.    Dispense:  1000 g    Refill:  2    Order Specific Question:   Supervising Provider    Answer:   Carlota Raspberry, JEFFREY R [[id number]]  . FERROUSUL 325 (65 Fe) MG tablet    Sig: Take 1 tablet (325 mg total) by mouth every other day.    Dispense:  45 tablet    Refill:  3    Order Specific Question:   Supervising Provider    Answer:   Carlota Raspberry, JEFFREY R [[id number]]    Follow-up: Return in about 2 weeks (around 01/28/2021) for pre op.   PLAN  Suspect functional rather than anatomical. Perhaps a b12 deficiency given her known hx of anemia  Suggest lab work today but pt declines.  Focus on nonpharm: hydrate, exercise, don't spend too long on toilet,  etc.  Psyllium husk daily, supplement with detox tea and MiraLax as needed  Patient encouraged to call clinic with any questions, comments, or concerns.  Maximiano Coss, NP

## 2021-01-25 ENCOUNTER — Encounter: Payer: Self-pay | Admitting: Registered Nurse

## 2021-01-25 ENCOUNTER — Other Ambulatory Visit: Payer: Self-pay

## 2021-01-25 ENCOUNTER — Ambulatory Visit (INDEPENDENT_AMBULATORY_CARE_PROVIDER_SITE_OTHER): Payer: Medicaid Other | Admitting: Registered Nurse

## 2021-01-25 VITALS — BP 107/68 | HR 74 | Temp 98.0°F | Resp 18 | Ht 63.0 in | Wt 204.6 lb

## 2021-01-25 DIAGNOSIS — K439 Ventral hernia without obstruction or gangrene: Secondary | ICD-10-CM

## 2021-01-25 DIAGNOSIS — Z01818 Encounter for other preprocedural examination: Secondary | ICD-10-CM

## 2021-01-25 LAB — URINALYSIS
Bilirubin Urine: NEGATIVE
Hgb urine dipstick: NEGATIVE
Ketones, ur: NEGATIVE
Leukocytes,Ua: NEGATIVE
Nitrite: NEGATIVE
Specific Gravity, Urine: >=1.03 — AB (ref 1.000–1.030)
Total Protein, Urine: NEGATIVE
Urine Glucose: NEGATIVE
Urobilinogen, UA: 0.2 (ref 0.0–1.0)
pH: 6 (ref 5.0–8.0)

## 2021-01-25 LAB — PROTIME-INR
INR: 1.1 ratio — ABNORMAL HIGH (ref 0.8–1.0)
Prothrombin Time: 12.4 s (ref 9.6–13.1)

## 2021-01-25 LAB — CBC WITH DIFFERENTIAL/PLATELET
Basophils Absolute: 0 10*3/uL (ref 0.0–0.1)
Basophils Relative: 0.5 % (ref 0.0–3.0)
Eosinophils Absolute: 0.2 10*3/uL (ref 0.0–0.7)
Eosinophils Relative: 2.3 % (ref 0.0–5.0)
HCT: 38.2 % (ref 36.0–46.0)
Hemoglobin: 12.2 g/dL (ref 12.0–15.0)
Lymphocytes Relative: 25.5 % (ref 12.0–46.0)
Lymphs Abs: 1.7 10*3/uL (ref 0.7–4.0)
MCHC: 31.9 g/dL (ref 30.0–36.0)
MCV: 78.6 fl (ref 78.0–100.0)
Monocytes Absolute: 0.4 10*3/uL (ref 0.1–1.0)
Monocytes Relative: 5.4 % (ref 3.0–12.0)
Neutro Abs: 4.4 10*3/uL (ref 1.4–7.7)
Neutrophils Relative %: 66.3 % (ref 43.0–77.0)
Platelets: 154 10*3/uL (ref 150.0–400.0)
RBC: 4.86 Mil/uL (ref 3.87–5.11)
RDW: 15.9 % — ABNORMAL HIGH (ref 11.5–15.5)
WBC: 6.7 10*3/uL (ref 4.0–10.5)

## 2021-01-25 LAB — COMPREHENSIVE METABOLIC PANEL
ALT: 14 U/L (ref 0–35)
AST: 13 U/L (ref 0–37)
Albumin: 4.1 g/dL (ref 3.5–5.2)
Alkaline Phosphatase: 72 U/L (ref 39–117)
BUN: 10 mg/dL (ref 6–23)
CO2: 28 mEq/L (ref 19–32)
Calcium: 8.9 mg/dL (ref 8.4–10.5)
Chloride: 103 mEq/L (ref 96–112)
Creatinine, Ser: 0.69 mg/dL (ref 0.40–1.20)
GFR: 112.29 mL/min (ref >60.00)
Glucose, Bld: 86 mg/dL (ref 70–99)
Potassium: 3.5 mEq/L (ref 3.5–5.1)
Sodium: 140 mEq/L (ref 135–145)
Total Bilirubin: 0.3 mg/dL (ref 0.2–1.2)
Total Protein: 8.2 g/dL (ref 6.0–8.3)

## 2021-01-25 LAB — HCG, QUANTITATIVE, PREGNANCY: Quantitative HCG: <0.6 m[IU]/mL

## 2021-01-25 LAB — HEMOGLOBIN A1C: Hgb A1c MFr Bld: 5.9 % (ref 4.6–6.5)

## 2021-01-25 LAB — TSH: TSH: 1.4 u[IU]/mL (ref 0.35–4.50)

## 2021-01-25 NOTE — Patient Instructions (Addendum)
Ms. Baroni-   Randie Heinz to see you again  No acute concerns today  If the labs are good, then there's no reason not to go forward with surgery  Ultrasound has been ordered to [facility] on [address] to check on the hernia. They should call soon, but you can give them a buzz if you'd prefer to get this done sooner.  [phone]  Good luck with everything  Thank you  Rich     If you have lab work done today you will be contacted with your lab results within the next 2 weeks.  If you have not heard from [place] then please contact us. The fastest way to get your results is to register for My Chart.   IF you received an x-ray today, you will receive an invoice from [facility]. Please contact [facility] at [phone] with questions or concerns regarding your invoice.   IF you received labwork today, you will receive an invoice from [place]. Please contact [facility] at [phone] with questions or concerns regarding your invoice.   Our billing staff will not be able to assist you with questions regarding bills from these companies.  You will be contacted with the lab results as soon as they are available. The fastest way to get your results is to activate your My Chart account. Instructions are located on the last page of this paperwork. If you have not heard from [place] regarding the results in 2 weeks, please contact this office.

## 2021-01-25 NOTE — Progress Notes (Addendum)
Established Patient Office Visit  Subjective:  Patient ID: Mia Wilkinson, female    DOB: Jan 22, 1986  Age: 35 y.o. MRN: [id number]  CC:  Chief Complaint  Patient presents with   Follow-up    Follow up foe her pre opp. Patient has no other concerns.    HPI [name] presents for preoperative visit  Has upcoming procedure: Liposuction with fat transfer to buttocks Surgery to take place on 02/04/21 with Dr. Leilani Merl.  Hx of anemia, dysrhythmia (sinus tach w/ pregnancy) and reflux, no further concerns.  Last labs reviewed with patient. Will collect labs today as well.  Past Medical History:  Diagnosis Date   Anemia    Dysrhythmia    tachycardia with pregnancy   GERD (gastroesophageal reflux disease)    with pregnancy    Past Surgical History:  Procedure Laterality Date   CESAREAN SECTION     x 3   CESAREAN SECTION N/A 12/18/2013   Procedure: Repeat CESAREAN SECTION;  Surgeon: Frederico Hamman, MD;  Location: [place] [facility];  Service: Obstetrics;  Laterality: N/A;   CESAREAN SECTION N/A 01/13/2015   Procedure: REPEAT CESAREAN SECTION;  Surgeon: Frederico Hamman, MD;  Location: [place] [facility];  Service: Obstetrics;  Laterality: N/A;   CESAREAN SECTION WITH BILATERAL TUBAL LIGATION Bilateral 03/30/2020   Procedure: CESAREAN SECTION WITH BILATERAL TUBAL LIGATION;  Surgeon: Cherre Blanc, MD;  Location: [facility];  Service: Obstetrics;  Laterality: Bilateral;   LIPOSUCTION  05/02/2019    Family History  Problem Relation Age of Onset   Hypertension Mother     Social History   Socioeconomic History   Marital status: Single    Spouse name: Not on file   Number of children: Not on file   Years of education: Not on file   Highest education level: Not on file  Occupational History   Not on file  Tobacco Use   Smoking status: Never Smoker   Smokeless tobacco: Never Used  Vaping Use   Vaping Use: Never used  Substance and Sexual Activity   Alcohol use:  No   Drug use: No   Sexual activity: Yes    Birth control/protection: None  Other Topics Concern   Not on file  Social History Narrative   Not on file   Social Determinants of Health   Financial Resource Strain: Not on file  Food Insecurity: Not on file  Transportation Needs: Not on file  Physical Activity: Not on file  Stress: Not on file  Social Connections: Not on file  Intimate Partner Violence: Not on file    Outpatient Medications Prior to Visit  Medication Sig Dispense Refill   FERROUSUL 325 (65 Fe) MG tablet Take 1 tablet (325 mg total) by mouth every other day. 45 tablet 3   Polyethylene Glycol 3350 POWD 17 g by Does not apply route daily as needed. 1000 g 2   psyllium (REGULOID) 0.52 g capsule Take 1-2 capsules (0.52-1.04 g total) by mouth daily. 180 capsule 3   acetaminophen (TYLENOL) 325 MG tablet Take 650 mg by mouth every 6 (six) hours as needed. (Patient not taking: Reported on 01/14/2021)     aspirin-acetaminophen-caffeine (EXCEDRIN MIGRAINE) 250-250-65 MG tablet Take by mouth every 6 (six) hours as needed for headache. (Patient not taking: Reported on 01/14/2021)     Blood Pressure Monitoring (BLOOD PRESSURE KIT) DEVI 1 Device by Does not apply route as needed. (Patient not taking: Reported on 01/14/2021) 1 each 0  butalbital-acetaminophen-caffeine (FIORICET) 50-325-40 MG tablet Take 1-2 tablets by mouth every 6 (six) hours as needed for headache. (Patient not taking: Reported on 01/14/2021) 20 tablet 0   No facility-administered medications prior to visit.    No Known Allergies  ROS Review of Systems  Constitutional: Negative.   HENT: Negative.   Eyes: Negative.   Respiratory: Negative.   Cardiovascular: Negative.   Gastrointestinal: Negative.   Genitourinary: Negative.   Musculoskeletal: Negative.   Skin: Negative.   Neurological: Negative.   Psychiatric/Behavioral: Negative.   All other systems reviewed and are negative.     Objective:     Physical Exam Vitals and nursing note reviewed.  Constitutional:      General: She is not in acute distress.    Appearance: Normal appearance. She is normal weight. She is not ill-appearing, toxic-appearing or diaphoretic.  Cardiovascular:     Rate and Rhythm: Normal rate and regular rhythm.     Heart sounds: Normal heart sounds. No murmur heard. No friction rub. No gallop.   Pulmonary:     Effort: Pulmonary effort is normal. No respiratory distress.     Breath sounds: Normal breath sounds. No stridor. No wheezing, rhonchi or rales.  Chest:     Chest wall: No tenderness.  Skin:    General: Skin is warm and dry.  Neurological:     General: No focal deficit present.     Mental Status: She is alert and oriented to person, place, and time. Mental status is at baseline.  Psychiatric:        Mood and Affect: Mood normal.        Behavior: Behavior normal.        Thought Content: Thought content normal.        Judgment: Judgment normal.     BP 107/68   Pulse 74   Temp 98 F (36.7 C) (Temporal)   Resp 18   Ht 5' 3"  (1.6 m)   Wt 204 lb 9.6 oz (92.8 kg)   SpO2 98%   BMI 36.24 kg/m  Wt Readings from Last 3 Encounters:  01/25/21 204 lb 9.6 oz (92.8 kg)  01/14/21 205 lb 12.8 oz (93.4 kg)  10/20/20 207 lb (93.9 kg)     Health Maintenance Due  Topic Date Due   PAP SMEAR-Modifier  03/05/2021    There are no preventive care reminders to display for this patient.  Lab Results  Component Value Date   TSH 1.557 02/24/2020   Lab Results  Component Value Date   WBC 10.3 03/31/2020   HGB 9.2 (L) 03/31/2020   HCT 31.1 (L) 03/31/2020   MCV 85.7 03/31/2020   PLT 130 (L) 03/31/2020   Lab Results  Component Value Date   NA 134 (L) 03/15/2020   K 3.0 (L) 03/15/2020   CO2 18 (L) 03/15/2020   GLUCOSE 112 (H) 03/15/2020   BUN 7 03/15/2020   CREATININE 0.56 03/31/2020   BILITOT 0.2 (L) 03/15/2020   ALKPHOS 82 03/15/2020   AST 14 (L) 03/15/2020   ALT 14 03/15/2020   PROT 6.8  03/15/2020   ALBUMIN 2.5 (L) 03/15/2020   CALCIUM 8.4 (L) 03/15/2020   ANIONGAP 12 03/15/2020   No results found for: CHOL No results found for: HDL No results found for: LDLCALC No results found for: TRIG No results found for: [facility] Lab Results  Component Value Date   HGBA1C 5.6 09/19/2019      Assessment & Plan:   Problem List Items Addressed  This Visit   None    Visit Diagnoses     Preoperative examination    -  Primary   Relevant Orders   CBC with Differential/Platelet   Hemoglobin A1c   Comprehensive metabolic panel   TSH   Protime-INR   Urinalysis   EKG 12-Lead   B-HCG Quant       No orders of the defined types were placed in this encounter.   Follow-up: No follow-ups on file.   PLAN Pending lab results, no preclusions to surgical procedure. EKG today compared with 02/26/20. No acute changes. No concerns. Seeing RRR without acute or chronic abnormalities, similar to EKG from 02/26/20. Patient encouraged to call clinic with any questions, comments, or concerns.  Maximiano Coss, NP

## 2021-01-26 ENCOUNTER — Ambulatory Visit
Admission: RE | Admit: 2021-01-26 | Discharge: 2021-01-26 | Disposition: A | Discharge status: Home / Self Care | Payer: Medicaid Other | Source: Ambulatory Visit | Attending: Registered Nurse | Admitting: Registered Nurse

## 2021-01-26 DIAGNOSIS — K439 Ventral hernia without obstruction or gangrene: Secondary | ICD-10-CM

## 2021-01-27 ENCOUNTER — Telehealth: Payer: Self-pay | Admitting: Registered Nurse

## 2021-01-27 NOTE — Telephone Encounter (Signed)
Has not yet been resulted to MyChart yet but will update patient when it is  Thank you  Luan Pulling

## 2021-01-27 NOTE — Telephone Encounter (Signed)
Called patient to inform. Patient wants to make sure results get faxed along with the rest of her documents(pre op)

## 2021-01-27 NOTE — Telephone Encounter (Signed)
Pt called in stating that she was wanting to know the results of the [place], she states that she was told that she doesn't have a hernia.  Please advise

## 2021-01-28 ENCOUNTER — Encounter: Payer: Self-pay | Admitting: Registered Nurse

## 2021-04-05 DIAGNOSIS — Z7251 High risk heterosexual behavior: Secondary | ICD-10-CM | POA: Diagnosis not present

## 2021-04-05 DIAGNOSIS — M25511 Pain in right shoulder: Secondary | ICD-10-CM | POA: Diagnosis not present

## 2021-04-05 DIAGNOSIS — N898 Other specified noninflammatory disorders of vagina: Secondary | ICD-10-CM | POA: Diagnosis not present

## 2021-04-05 DIAGNOSIS — B9689 Other specified bacterial agents as the cause of diseases classified elsewhere: Secondary | ICD-10-CM | POA: Diagnosis not present

## 2021-04-05 DIAGNOSIS — N760 Acute vaginitis: Secondary | ICD-10-CM | POA: Diagnosis not present

## 2021-04-21 DIAGNOSIS — N898 Other specified noninflammatory disorders of vagina: Secondary | ICD-10-CM | POA: Diagnosis not present

## 2021-04-21 DIAGNOSIS — Z113 Encounter for screening for infections with a predominantly sexual mode of transmission: Secondary | ICD-10-CM | POA: Diagnosis not present

## 2021-04-21 DIAGNOSIS — B373 Candidiasis of vulva and vagina: Secondary | ICD-10-CM | POA: Diagnosis not present

## 2021-05-16 DIAGNOSIS — R300 Dysuria: Secondary | ICD-10-CM | POA: Diagnosis not present

## 2021-05-16 DIAGNOSIS — N390 Urinary tract infection, site not specified: Secondary | ICD-10-CM | POA: Diagnosis not present

## 2021-05-16 DIAGNOSIS — R102 Pelvic and perineal pain: Secondary | ICD-10-CM | POA: Diagnosis not present

## 2021-06-09 DIAGNOSIS — B379 Candidiasis, unspecified: Secondary | ICD-10-CM | POA: Diagnosis not present

## 2021-06-09 DIAGNOSIS — N760 Acute vaginitis: Secondary | ICD-10-CM | POA: Diagnosis not present

## 2021-06-09 DIAGNOSIS — T3695XA Adverse effect of unspecified systemic antibiotic, initial encounter: Secondary | ICD-10-CM | POA: Diagnosis not present

## 2021-06-09 DIAGNOSIS — B9689 Other specified bacterial agents as the cause of diseases classified elsewhere: Secondary | ICD-10-CM | POA: Diagnosis not present

## 2021-06-09 DIAGNOSIS — N898 Other specified noninflammatory disorders of vagina: Secondary | ICD-10-CM | POA: Diagnosis not present

## 2021-07-27 ENCOUNTER — Ambulatory Visit: Payer: Medicaid Other | Admitting: Registered Nurse

## 2021-08-14 DIAGNOSIS — N760 Acute vaginitis: Secondary | ICD-10-CM | POA: Diagnosis not present

## 2021-08-30 DIAGNOSIS — B3731 Acute candidiasis of vulva and vagina: Secondary | ICD-10-CM | POA: Diagnosis not present

## 2021-09-14 DIAGNOSIS — Z113 Encounter for screening for infections with a predominantly sexual mode of transmission: Secondary | ICD-10-CM | POA: Diagnosis not present

## 2021-09-14 DIAGNOSIS — N760 Acute vaginitis: Secondary | ICD-10-CM | POA: Diagnosis not present

## 2021-09-14 DIAGNOSIS — R8281 Pyuria: Secondary | ICD-10-CM | POA: Diagnosis not present

## 2021-09-24 ENCOUNTER — Telehealth (INDEPENDENT_AMBULATORY_CARE_PROVIDER_SITE_OTHER): Payer: Medicaid Other | Admitting: Registered Nurse

## 2021-09-24 DIAGNOSIS — B3731 Acute candidiasis of vulva and vagina: Secondary | ICD-10-CM | POA: Diagnosis not present

## 2021-09-24 DIAGNOSIS — R519 Headache, unspecified: Secondary | ICD-10-CM | POA: Diagnosis not present

## 2021-09-24 MED ORDER — IBUPROFEN 600 MG PO TABS: 600.0000 mg | ORAL_TABLET | Freq: Three times a day (TID) | ORAL | 0 refills | Status: DC | PRN

## 2021-09-24 MED ORDER — BREXAFEMME 150 MG PO TABS: 300.0000 mg | ORAL_TABLET | Freq: Two times a day (BID) | ORAL | 0 refills | Status: DC

## 2021-09-24 NOTE — Progress Notes (Signed)
Telemedicine Encounter- SOAP NOTE Established Patient  This telephone encounter was conducted with the patient's (or proxy's) verbal consent via audio telecommunications: yes/no: Yes Patient was instructed to have this encounter in a suitably private space; and to only have persons present to whom they give permission to participate. In addition, patient identity was confirmed by use of name plus two identifiers (DOB and address).  I discussed the limitations, risks, security and privacy concerns of performing an evaluation and management service by telephone and the availability of in person appointments. I also discussed with the patient that there may be a patient responsible charge related to this service. The patient expressed understanding and agreed to proceed.  I spent a total of 14 minutes talking with the patient or their proxy.  Patient at home Provider in office  Participants: Mia Sportsman, NP and Mia Wilkinson  Chief Complaint  Patient presents with   Vaginal Discharge    Pt went to urgent care 3 times, was told yeast infection was given rx, then told UTI was given abx for that and finished it then the discharge returned, was given another fill abx, was given 4 tabs diflucan, denies odor, reports irritated and discharge,     Subjective   Mia Wilkinson is a 36 y.o. established patient. Telephone visit today for vaginal discharge.  HPI UC visit x 3 Yeast vaginitis, followed by UTI Diflucan followed abx followed by abx + 4 doses diflucan  Still has vaginal irritation and itching. No pain Some discharge No odor Tested negative for STIs x 2 at UC  No urinary symptoms, pelvic pain, menstrual abnormalities, or constitutional symptoms.   Patient Active Problem List   Diagnosis Date Noted   Labor and delivery, indication for care 03/30/2020   Unwanted fertility 02/11/2020   Headache in pregnancy, antepartum, third trimester 01/08/2020   Maternal  obesity affecting pregnancy, antepartum 11/14/2019   Frequent headaches 11/14/2019   Supervision of high risk pregnancy, antepartum 09/09/2019   Hernia, ventral 09/09/2019   History of cesarean delivery, currently pregnant 12/18/2013    Past Medical History:  Diagnosis Date   Anemia    Dysrhythmia    tachycardia with pregnancy   GERD (gastroesophageal reflux disease)    with pregnancy    Current Outpatient Medications  Medication Sig Dispense Refill   Ibrexafungerp Citrate (BREXAFEMME) 150 MG TABS Take 300 mg by mouth every 12 (twelve) hours. 4 tablet 0   ibuprofen (ADVIL) 600 MG tablet Take 1 tablet (600 mg total) by mouth every 8 (eight) hours as needed. 30 tablet 0   Polyethylene Glycol 3350 POWD 17 g by Does not apply route daily as needed. 1000 g 2   psyllium (REGULOID) 0.52 g capsule Take 1-2 capsules (0.52-1.04 g total) by mouth daily. 180 capsule 3   FERROUSUL 325 (65 Fe) MG tablet Take 1 tablet (325 mg total) by mouth every other day. 45 tablet 3   No current facility-administered medications for this visit.    No Known Allergies  Social History   Socioeconomic History   Marital status: Single    Spouse name: Not on file   Number of children: Not on file   Years of education: Not on file   Highest education level: Not on file  Occupational History   Not on file  Tobacco Use   Smoking status: Never   Smokeless tobacco: Never  Vaping Use   Vaping Use: Never used  Substance and Sexual Activity   Alcohol  use: No   Drug use: No   Sexual activity: Yes    Birth control/protection: None  Other Topics Concern   Not on file  Social History Narrative   Not on file   Social Determinants of Health   Financial Resource Strain: Not on file  Food Insecurity: Not on file  Transportation Needs: Not on file  Physical Activity: Not on file  Stress: Not on file  Social Connections: Not on file  Intimate Partner Violence: Not on file    Review of Systems   Constitutional: Negative.   HENT: Negative.    Eyes: Negative.   Respiratory: Negative.    Cardiovascular: Negative.   Gastrointestinal: Negative.   Genitourinary: Negative.   Musculoskeletal: Negative.   Skin: Negative.   Neurological: Negative.   Endo/Heme/Allergies: Negative.   Psychiatric/Behavioral: Negative.    All other systems reviewed and are negative.  Objective   Vitals as reported by the patient: There were no vitals filed for this visit.  Mia Wilkinson was seen today for vaginal discharge.  Diagnoses and all orders for this visit:  Vagina, candidiasis -     Ibrexafungerp Citrate (BREXAFEMME) 150 MG TABS; Take 300 mg by mouth every 12 (twelve) hours.  Frequent headaches -     ibuprofen (ADVIL) 600 MG tablet; Take 1 tablet (600 mg total) by mouth every 8 (eight) hours as needed.    PLAN Will give brexafemme as above. Follow up closely. May benefit from topical Discussed nonpharm relief by avoiding scented products, sleeping with loose fitting clothing, and using probiotics, healthy diet, aggressive hydration Return if worsening or failing to improve Patient encouraged to call clinic with any questions, comments, or concerns.  I discussed the assessment and treatment plan with the patient. The patient was provided an opportunity to ask questions and all were answered. The patient agreed with the plan and demonstrated an understanding of the instructions.   The patient was advised to call back or seek an in-person evaluation if the symptoms worsen or if the condition fails to improve as anticipated.  I provided 14 minutes of non-face-to-face time during this encounter.  Mia Agee, NP

## 2021-09-27 ENCOUNTER — Telehealth: Payer: Self-pay

## 2021-09-27 NOTE — Telephone Encounter (Signed)
Pt is wanting to know if she can take the diflucan she tried that at Urgent Care and that was not working last time. She is saying she can't do another Urine test because she is on her period?

## 2021-09-27 NOTE — Telephone Encounter (Signed)
Will need her to have lab visit to drop urine sample for cytology and urinalysis w/ reflex to culture  Thank you  Rich

## 2021-09-27 NOTE — Telephone Encounter (Signed)
Caller name:Onesty Chilcote   On DPR? :Yes  Call back number:[phone]  Provider they see: Richard  Reason for call:Pt needs pro authorization Ibrexafungerp Citrate (BREXAFEMME) 150 MG TABS  pharmacy will not fill because it was to expensive

## 2021-09-28 NOTE — Telephone Encounter (Signed)
She can take diflucan yes  Thanks,  Luan Pulling

## 2021-09-30 DIAGNOSIS — N760 Acute vaginitis: Secondary | ICD-10-CM | POA: Diagnosis not present

## 2021-09-30 DIAGNOSIS — Z202 Contact with and (suspected) exposure to infections with a predominantly sexual mode of transmission: Secondary | ICD-10-CM | POA: Diagnosis not present

## 2021-09-30 DIAGNOSIS — R82998 Other abnormal findings in urine: Secondary | ICD-10-CM | POA: Diagnosis not present

## 2021-09-30 NOTE — Telephone Encounter (Signed)
If diflucan has been taken already that many times, I would recommend she follow up with Gyn for further relief. Would recommend urgent care for relief, if unwilling, tylenol 1000mg  po tid with ibuprofen 600mg  po q6h prn, continue ice pack, wash with cool water only, no soap, try to keep loose fitting clothing on  Thanks,  Rich

## 2021-09-30 NOTE — Telephone Encounter (Signed)
Spoke to patient and she states that she set up an appt for [facility] today because she couldn't wait until tomorrow. She requested that I cancel her appt for tomorrow.

## 2021-09-30 NOTE — Telephone Encounter (Signed)
Spoke to patient and she stated that she would need a rx for diflucan sent in. She also stated that she has taken the diflucan about 6 times already with no relief. The irritation doesn't bother her during the day but at night it's worse. The only relief she gets is with an ice pack. She is not sure what's going on. I put her on the schedule for tomorrow with Dr Beverely Low because it sounded like she needed to be re-evaluated. Patient wanted to know what she could possibly do for today and tonight to help alleviate irritation.

## 2021-10-01 ENCOUNTER — Ambulatory Visit: Payer: Medicaid Other | Admitting: Family Medicine

## 2021-10-01 ENCOUNTER — Ambulatory Visit: Payer: Medicaid Other

## 2021-10-01 NOTE — Telephone Encounter (Signed)
Acknowledged, ° °Mia Wilkinson

## 2021-10-12 ENCOUNTER — Other Ambulatory Visit (HOSPITAL_COMMUNITY)
Admission: RE | Admit: 2021-10-12 | Discharge: 2021-10-12 | Disposition: A | Discharge status: Home / Self Care | Payer: Medicaid Other | Source: Ambulatory Visit | Attending: Registered Nurse | Admitting: Registered Nurse

## 2021-10-12 ENCOUNTER — Encounter: Payer: Self-pay | Admitting: Registered Nurse

## 2021-10-12 ENCOUNTER — Other Ambulatory Visit: Payer: Self-pay

## 2021-10-12 ENCOUNTER — Ambulatory Visit (INDEPENDENT_AMBULATORY_CARE_PROVIDER_SITE_OTHER): Payer: Medicaid Other | Admitting: Registered Nurse

## 2021-10-12 ENCOUNTER — Telehealth: Payer: Self-pay

## 2021-10-12 VITALS — BP 121/79 | HR 72 | Temp 98.1°F | Resp 18 | Ht 63.0 in | Wt 209.2 lb

## 2021-10-12 DIAGNOSIS — Z124 Encounter for screening for malignant neoplasm of cervix: Secondary | ICD-10-CM

## 2021-10-12 DIAGNOSIS — R519 Headache, unspecified: Secondary | ICD-10-CM

## 2021-10-12 DIAGNOSIS — R350 Frequency of micturition: Secondary | ICD-10-CM | POA: Diagnosis not present

## 2021-10-12 DIAGNOSIS — N761 Subacute and chronic vaginitis: Secondary | ICD-10-CM

## 2021-10-12 DIAGNOSIS — B3731 Acute candidiasis of vulva and vagina: Secondary | ICD-10-CM | POA: Diagnosis not present

## 2021-10-12 LAB — POCT URINALYSIS DIP (MANUAL ENTRY)
Bilirubin, UA: NEGATIVE
Glucose, UA: NEGATIVE mg/dL
Ketones, POC UA: NEGATIVE mg/dL
Nitrite, UA: NEGATIVE
Protein Ur, POC: 30 mg/dL — AB
Spec Grav, UA: >=1.03 — AB (ref 1.010–1.025)
Urobilinogen, UA: 0.2 E.U./dL
pH, UA: 6 (ref 5.0–8.0)

## 2021-10-12 MED ORDER — IBUPROFEN 600 MG PO TABS: 600.0000 mg | ORAL_TABLET | Freq: Four times a day (QID) | ORAL | 0 refills | Status: DC | PRN

## 2021-10-12 MED ORDER — FLUOCINONIDE EMULSIFIED BASE 0.05 % EX CREA: 1.0000 "application " | TOPICAL_CREAM | Freq: Two times a day (BID) | CUTANEOUS | 0 refills | Status: AC

## 2021-10-12 MED ORDER — FLUOCINOLONE ACETONIDE 0.025 % EX OINT: TOPICAL_OINTMENT | Freq: Two times a day (BID) | CUTANEOUS | 0 refills | Status: DC

## 2021-10-12 NOTE — Telephone Encounter (Signed)
Caller name:Raahi Bieser  On DPR? :Yes  Call back number:[phone]  Provider they see: Richard  Reason for call: Pt is calling pharmacy does not have cream fluocinolone (SYNALAR) 0.025 % ointment   Pt is wanting a pill

## 2021-10-12 NOTE — Patient Instructions (Addendum)
Ms Fromer -   Sorry this is still going on!  We'll know within a couple of days what's happening  I have sent a topical steroid to use twice daily  Continue motrin and add tylenol 1000mg  three times daily  Thank you  Rich     If you have lab work done today you will be contacted with your lab results within the next 2 weeks.  If you have not heard from [place] then please contact us. The fastest way to get your results is to register for My Chart.   IF you received an x-ray today, you will receive an invoice from [facility]. Please contact [facility] at [phone] with questions or concerns regarding your invoice.   IF you received labwork today, you will receive an invoice from [facility]. Please contact [facility] at [phone] with questions or concerns regarding your invoice.   Our billing staff will not be able to assist you with questions regarding bills from these companies.  You will be contacted with the lab results as soon as they are available. The fastest way to get your results is to activate your My Chart account. Instructions are located on the last page of this paperwork. If you have not heard from [place] regarding the results in 2 weeks, please contact this office.

## 2021-10-12 NOTE — Progress Notes (Signed)
Established Patient Office Visit  Subjective:  Patient ID: Mia Wilkinson, female    DOB: 1985-09-06  Age: 36 y.o. MRN: [id number]  CC:  Chief Complaint  Patient presents with   Vaginitis    Patient states she keeps getting yeast infections back to back.    HPI [facility] presents for recurrent vaginitis  Has not been tested for STI,  Has been tested for UTI and vaginitis Has been treated with diflucan a number of times.  Will occ see improvement but only for brief periods, then will resume discharge, irritation, burning sensation No bleeding, no rash or skin lesions that she can see.   Past Medical History:  Diagnosis Date   Anemia    Dysrhythmia    tachycardia with pregnancy   GERD (gastroesophageal reflux disease)    with pregnancy    Past Surgical History:  Procedure Laterality Date   CESAREAN SECTION     x 3   CESAREAN SECTION N/A 12/18/2013   Procedure: Repeat CESAREAN SECTION;  Surgeon: Frederico Hamman, MD;  Location: [facility];  Service: Obstetrics;  Laterality: N/A;   CESAREAN SECTION N/A 01/13/2015   Procedure: REPEAT CESAREAN SECTION;  Surgeon: Frederico Hamman, MD;  Location: [facility];  Service: Obstetrics;  Laterality: N/A;   CESAREAN SECTION WITH BILATERAL TUBAL LIGATION Bilateral 03/30/2020   Procedure: CESAREAN SECTION WITH BILATERAL TUBAL LIGATION;  Surgeon: Cherre Blanc, MD;  Location: [facility];  Service: Obstetrics;  Laterality: Bilateral;   LIPOSUCTION  05/02/2019    Family History  Problem Relation Age of Onset   Hypertension Mother     Social History   Socioeconomic History   Marital status: Single    Spouse name: Not on file   Number of children: Not on file   Years of education: Not on file   Highest education level: Not on file  Occupational History   Not on file  Tobacco Use   Smoking status: Never   Smokeless tobacco: Never  Vaping Use   Vaping Use: Never used  Substance and Sexual Activity   Alcohol  use: No   Drug use: No   Sexual activity: Yes    Birth control/protection: None  Other Topics Concern   Not on file  Social History Narrative   Not on file   Social Determinants of Health   Financial Resource Strain: Not on file  Food Insecurity: Not on file  Transportation Needs: Not on file  Physical Activity: Not on file  Stress: Not on file  Social Connections: Not on file  Intimate Partner Violence: Not on file    Outpatient Medications Prior to Visit  Medication Sig Dispense Refill   Ibrexafungerp Citrate (BREXAFEMME) 150 MG TABS Take 300 mg by mouth every 12 (twelve) hours. 4 tablet 0   Polyethylene Glycol 3350 POWD 17 g by Does not apply route daily as needed. 1000 g 2   psyllium (REGULOID) 0.52 g capsule Take 1-2 capsules (0.52-1.04 g total) by mouth daily. 180 capsule 3   ibuprofen (ADVIL) 600 MG tablet Take 1 tablet (600 mg total) by mouth every 8 (eight) hours as needed. 30 tablet 0   FERROUSUL 325 (65 Fe) MG tablet Take 1 tablet (325 mg total) by mouth every other day. 45 tablet 3   No facility-administered medications prior to visit.    No Known Allergies  ROS Review of Systems  Constitutional: Negative.   HENT: Negative.    Eyes: Negative.   Respiratory:  Negative.    Cardiovascular: Negative.   Gastrointestinal: Negative.   Genitourinary: Negative.   Musculoskeletal: Negative.   Skin: Negative.   Neurological: Negative.   Psychiatric/Behavioral: Negative.    All other systems reviewed and are negative.    Objective:    Physical Exam Vitals and nursing note reviewed. Exam conducted with a chaperone present.  Constitutional:      General: She is not in acute distress.    Appearance: Normal appearance. She is normal weight. She is not ill-appearing, toxic-appearing or diaphoretic.  Cardiovascular:     Rate and Rhythm: Normal rate and regular rhythm.     Heart sounds: Normal heart sounds. No murmur heard.   No friction rub. No gallop.  Pulmonary:      Effort: Pulmonary effort is normal. No respiratory distress.     Breath sounds: Normal breath sounds. No stridor. No wheezing, rhonchi or rales.  Chest:     Chest wall: No tenderness.  Genitourinary:    Pubic Area: No rash.      Labia:        Right: Tenderness present.      Vagina: Vaginal discharge, erythema and tenderness present. No bleeding or lesions.     Cervix: Discharge and erythema present. No cervical motion tenderness or friability.     Comments: Gray/green curd like discharge noted in vaginal canal. Irritation and erythema throughout Skin:    General: Skin is warm and dry.  Neurological:     General: No focal deficit present.     Mental Status: She is alert and oriented to person, place, and time. Mental status is at baseline.  Psychiatric:        Mood and Affect: Mood normal.        Behavior: Behavior normal.        Thought Content: Thought content normal.        Judgment: Judgment normal.    BP 121/79    Pulse 72    Temp 98.1 F (36.7 C) (Temporal)    Resp 18    Ht 5\' 3"  (1.6 m)    Wt 209 lb 3.2 oz (94.9 kg)    SpO2 99%    BMI 37.06 kg/m  Wt Readings from Last 3 Encounters:  10/12/21 209 lb 3.2 oz (94.9 kg)  01/25/21 204 lb 9.6 oz (92.8 kg)  01/14/21 205 lb 12.8 oz (93.4 kg)     Health Maintenance Due  Topic Date Due   COVID-19 Vaccine (1) Never done   PAP SMEAR-Modifier  03/05/2021    There are no preventive care reminders to display for this patient.  Lab Results  Component Value Date   TSH 1.40 01/25/2021   Lab Results  Component Value Date   WBC 6.7 01/25/2021   HGB 12.2 01/25/2021   HCT 38.2 01/25/2021   MCV 78.6 01/25/2021   PLT 154.0 01/25/2021   Lab Results  Component Value Date   NA 140 01/25/2021   K 3.5 01/25/2021   CO2 28 01/25/2021   GLUCOSE 86 01/25/2021   BUN 10 01/25/2021   CREATININE 0.69 01/25/2021   BILITOT 0.3 01/25/2021   ALKPHOS 72 01/25/2021   AST 13 01/25/2021   ALT 14 01/25/2021   PROT 8.2 01/25/2021    ALBUMIN 4.1 01/25/2021   CALCIUM 8.9 01/25/2021   ANIONGAP 12 03/15/2020   GFR 112.29 01/25/2021   No results found for: CHOL No results found for: HDL No results found for: LDLCALC No results found for: TRIG No  results found for: CHOLHDL Lab Results  Component Value Date   HGBA1C 5.9 01/25/2021      Assessment & Plan:   Problem List Items Addressed This Visit       Other   Frequent headaches   Relevant Medications   ibuprofen (ADVIL) 600 MG tablet   Other Visit Diagnoses     Vagina, candidiasis    -  Primary   Relevant Orders   POCT urinalysis dipstick (Completed)   Urine Culture   Urine cytology ancillary only([place])   Frequent urination       Relevant Orders   POCT urinalysis dipstick (Completed)   Urine Culture   Urine cytology ancillary only([place])   Subacute vaginitis       Relevant Medications   fluocinolone (SYNALAR) 0.025 % ointment   Papanicolaou smear       Relevant Orders   Cytology - PAP       Meds ordered this encounter  Medications   fluocinolone (SYNALAR) 0.025 % ointment    Sig: Apply topically 2 (two) times daily.    Dispense:  30 g    Refill:  0    Order Specific Question:   Supervising Provider    Answer:   Carlota Raspberry, JEFFREY R [[id number]]   ibuprofen (ADVIL) 600 MG tablet    Sig: Take 1 tablet (600 mg total) by mouth every 6 (six) hours as needed.    Dispense:  30 tablet    Refill:  0    Order Specific Question:   Supervising Provider    Answer:   Carlota Raspberry, JEFFREY R [id number]    Follow-up: Return if symptoms worsen or fail to improve.   PLAN' Redundant cytology sent given severe and recurrent symptoms Fluocinolone given for irritation and pain. Will treat as labs indicate Pap collected given HM due Patient encouraged to call clinic with any questions, comments, or concerns.  Maximiano Coss, NP

## 2021-10-12 NOTE — Telephone Encounter (Signed)
Called patient to let her know that's it was a new prescription sent to the pharmacy .

## 2021-10-12 NOTE — Addendum Note (Signed)
Addended by: [name] on: 10/12/2021 10:01 AM   Modules accepted: Orders

## 2021-10-13 LAB — CYTOLOGY - PAP
Chlamydia: NEGATIVE
Comment: NEGATIVE
Comment: NEGATIVE
Comment: NEGATIVE
Comment: NORMAL
Diagnosis: NEGATIVE
High risk HPV: NEGATIVE
Neisseria Gonorrhea: NEGATIVE
Trichomonas: NEGATIVE

## 2021-10-13 LAB — URINE CULTURE
MICRO NUMBER:: 12974968
SPECIMEN QUALITY:: ADEQUATE

## 2021-10-14 ENCOUNTER — Telehealth: Payer: Self-pay | Admitting: Registered Nurse

## 2021-10-14 DIAGNOSIS — B3731 Acute candidiasis of vulva and vagina: Secondary | ICD-10-CM

## 2021-10-14 MED ORDER — FLUCONAZOLE 150 MG PO TABS: 150.0000 mg | ORAL_TABLET | ORAL | 0 refills | Status: DC

## 2021-10-14 MED ORDER — CLOTRIMAZOLE-BETAMETHASONE 1-0.05 % EX CREA: 1.0000 "application " | TOPICAL_CREAM | Freq: Every day | CUTANEOUS | 0 refills | Status: AC

## 2021-10-14 NOTE — Telephone Encounter (Signed)
See other message

## 2021-10-14 NOTE — Telephone Encounter (Signed)
Chief Complaint Lab Result Reason for Call Symptomatic / Request for Health Information Initial Comment Caller states she has test results and needs to understand them. Translation No Nurse Assessment Nurse: D'Heur Lucia Gaskins, RN, Adrienne Date/Time (Eastern Time): 10/14/2021 9:28:31 AM Confirm and document reason for call. If symptomatic, describe symptoms. ---Caller states she has test results and needs to understand them. She had a urinalysis and a pap smear. U/A is clear as per results read to this nuyrse, STD screen is clear based on what is read to this nurse. Caller states there is a "Flag A" on the streptococcus agalactiae. Advised that [facility] A indicates abnormal. Caller states she wants abx in gel form for the symptoms she jhas been having since December. She was treated for yeast with Diflucan but it has not helped. She has intermittent itching, discharge. She denies s/s currently today. She states she spoke w/ the office & MD is out sick. He has to look at results and determine what patient may need for treatment. Patient is irritated that she is not getting what she needs. She disconnected call. Does the patient have any new or worsening symptoms? ---No Disp. Time Eilene Ghazi Time) Disposition Final User 10/14/2021 8:44:19 AM Attempt made - no message left [place], RN, Domenick Gong 10/14/2021 8:54:25 AM FINAL ATTEMPT MADE - no message left [place], RN, Domenick Gong 10/14/2021 8:54:31 AM Send to RN Final Attempt Donald Pore, RN, Vivien Rota Janie 10/14/2021 8:57:16 AM Attempt made - no message left D'Heur Lucia Gaskins, RN, Vincente Liberty PLEASE NOTE: All timestamps contained within this report are represented as [place] Standard Time. CONFIDENTIALTY NOTICE: This fax transmission is intended only for the addressee. It contains information that is legally privileged, confidential or otherwise protected from use or disclosure. If you are not the intended recipient, you are strictly prohibited  from reviewing, disclosing, copying using or disseminating any of this information or taking any action in reliance on or regarding this information. If you have received this fax in error, please notify us immediately by telephone so that we can arrange for its return to [place]. Phone: [phone], Toll-Free: [phone], Fax: [phone] Page: 2 of 2 Call Id: [id number] [place]. Time Eilene Ghazi Time) Disposition Final User 10/14/2021 9:05:24 AM Attempt made - no message left D'Heur Macky Lower 10/14/2021 9:45:24 AM Clinical Call Yes D'Heur Lucia Gaskins, RN, Vincente Liberty Comments User: Vincente Liberty, D'Heur Lucia Gaskins, RN Date/Time Eilene Ghazi Time): 10/14/2021 8:57:37 AM Voice mailbox full, unable to leave message

## 2021-10-14 NOTE — Telephone Encounter (Signed)
Pt called in stating that she saw her lab results on mychart she is wanting someone to call her in regards to them. She needs to know what to  do. She states that she was unable to get the prescription because the pharmacy was out of stock. Can someone address this in [place] absents she wants to know something asap.

## 2021-10-14 NOTE — Telephone Encounter (Signed)
I have spoke with patient to make her aware of lab results. Patient understood and is going to the pharmacy.

## 2021-10-14 NOTE — Telephone Encounter (Signed)
Attempted to reached out to patient twice and it went straight to voicemail and it was full at the moment to let discuss what dr Mia Wilkinson has said.

## 2021-10-14 NOTE — Telephone Encounter (Signed)
Please Advise . Caller states she has test results and needs to understand them. She had a urinalysis and a pap smear. U/A is clear as per results read to this nuyrse, STD screen is clear based on what is read to this nurse. Caller states there is a "Flag A" on the streptococcus agalactiae. Advised that Flag A indicates abnormal. Caller states she wants abx in gel form for the symptoms she jhas been having since December. She was treated for yeast with Diflucan but it has not helped. She has intermittent itching, discharge. She denies s/s currently today. She states she spoke w/ the office & MD is out sick. He has to look at results and determine what patient may need for treatment. Patient is irritated that she is not getting what she needs. She disconnected call.

## 2021-10-14 NOTE — Telephone Encounter (Signed)
Patient was seen in office for vaginal inflammation. Patient would like the lab results discuss and would like something sent to her pharmacy because the vaginal cream and ointment were bout out of stock. Please advise

## 2021-10-14 NOTE — Telephone Encounter (Signed)
Notes reviewed.  I am sorry she is still having those symptoms but would like to try to help.  The good news is her STI screening was negative.  No sign of chlamydia, gonorrhea, or trichomonas and urine culture did not indicate a urinary tract infection.  The bacteria detected was likely contaminant from typical vaginal bacteria, or skin bacteria.  Yeast was noted on the Pap testing. We will try a treatment regimen for complicated yeast infection.  Take Diflucan every 3 days for 3 doses, and for the next few days can try Lotrisone cream topical which has antifungal as well as a steroid to help with some of the irritation.  She should see some improvement fairly quickly with this approach, but would recommend she follow-up with OB/GYN as well if continued symptoms.

## 2021-10-18 ENCOUNTER — Other Ambulatory Visit: Payer: Self-pay | Admitting: Registered Nurse

## 2021-10-18 DIAGNOSIS — B3731 Acute candidiasis of vulva and vagina: Secondary | ICD-10-CM

## 2021-10-18 MED ORDER — CLOTRIMAZOLE 1 % EX CREA: 1.0000 "application " | TOPICAL_CREAM | Freq: Two times a day (BID) | CUTANEOUS | 0 refills | Status: DC

## 2021-11-05 ENCOUNTER — Other Ambulatory Visit: Payer: Self-pay

## 2021-11-05 ENCOUNTER — Telehealth (INDEPENDENT_AMBULATORY_CARE_PROVIDER_SITE_OTHER): Payer: Medicaid Other | Admitting: Registered Nurse

## 2021-11-05 DIAGNOSIS — R935 Abnormal findings on diagnostic imaging of other abdominal regions, including retroperitoneum: Secondary | ICD-10-CM

## 2021-11-05 NOTE — Progress Notes (Signed)
? ? ?Telemedicine Encounter- SOAP NOTE Established Patient ? ?This telephone encounter was conducted with the patient's (or proxy's) verbal consent via audio telecommunications: yes/no: Yes ?Patient was instructed to have this encounter in a suitably private space; and to only have persons present to whom they give permission to participate. In addition, patient identity was confirmed by use of name plus two identifiers (DOB and address).  I discussed the limitations, risks, security and privacy concerns of performing an evaluation and management service by telephone and the availability of in person appointments. I also discussed with the patient that there may be a patient responsible charge related to this service. The patient expressed understanding and agreed to proceed. ? ?I spent a total of 16 minutes talking with the patient or their proxy. ? ?Patient at home ?Provider in office ? ?Participants: [name], NP and Philbert Riser [place] ? ?Chief Complaint  ?Patient presents with  ? Referral  ?  Patient states she is trying to get a referral for scarred tissue that she had. Patient states she is having more problems with stomach pain. Stomach has got worse   ? ? ?Subjective  ? ?Mia Wilkinson is a 36 y.o. established patient. Telephone visit today for referral ? ?HPI ?Abdominal pain on and off.  ?She did have liposuction, but did not resolve.  ?Had [place] in the past, but no hernia revealed. ?She has had a number of c-sections in the past and has been told she has scar tissue sufficient enough to cause pain ? ?She is taking ibuprofen with some relief ? ?Some diarrhea, no blood or mucus in stool. ? ?No weight changes ? ?Lower abdominal pain, cramping, discomfort. ? ?Patient Active Problem List  ? Diagnosis Date Noted  ? Labor and delivery, indication for care 03/30/2020  ? Unwanted fertility 02/11/2020  ? Headache in pregnancy, antepartum, third trimester 01/08/2020  ? Maternal obesity affecting pregnancy,  antepartum 11/14/2019  ? Frequent headaches 11/14/2019  ? Supervision of high risk pregnancy, antepartum 09/09/2019  ? Hernia, ventral 09/09/2019  ? History of cesarean delivery, currently pregnant 12/18/2013  ? ? ?Past Medical History:  ?Diagnosis Date  ? Anemia   ? Dysrhythmia   ? tachycardia with pregnancy  ? GERD (gastroesophageal reflux disease)   ? with pregnancy  ? ? ?Current Outpatient Medications  ?Medication Sig Dispense Refill  ? clotrimazole (LOTRIMIN) 1 % cream Apply 1 application topically 2 (two) times daily. 30 g 0  ? clotrimazole-betamethasone (LOTRISONE) cream Apply 1 application topically daily. Vaginal application daily for 2-3 days total. 30 g 0  ? fluconazole (DIFLUCAN) 150 MG tablet Take 1 tablet (150 mg total) by mouth every 3 (three) days. 3 tablet 0  ? fluocinonide-emollient (LIDEX-E) 0.05 % cream Apply 1 application topically 2 (two) times daily. 60 g 0  ? ibuprofen (ADVIL) 600 MG tablet Take 1 tablet (600 mg total) by mouth every 6 (six) hours as needed. 30 tablet 0  ? Polyethylene Glycol 3350 POWD 17 g by Does not apply route daily as needed. 1000 g 2  ? psyllium (REGULOID) 0.52 g capsule Take 1-2 capsules (0.52-1.04 g total) by mouth daily. 180 capsule 3  ? FERROUSUL 325 (65 Fe) MG tablet Take 1 tablet (325 mg total) by mouth every other day. 45 tablet 3  ? ?No current facility-administered medications for this visit.  ? ? ?No Known Allergies ? ?Social History  ? ?Socioeconomic History  ? Marital status: Single  ?  Spouse name: Not on file  ?  Number of children: Not on file  ? Years of education: Not on file  ? Highest education level: Not on file  ?Occupational History  ? Not on file  ?Tobacco Use  ? Smoking status: Never  ? Smokeless tobacco: Never  ?Vaping Use  ? Vaping Use: Never used  ?Substance and Sexual Activity  ? Alcohol use: No  ? Drug use: No  ? Sexual activity: Yes  ?  Birth control/protection: None  ?Other Topics Concern  ? Not on file  ?Social History Narrative  ? Not on  file  ? ?Social Determinants of Health  ? ?Financial Resource Strain: Not on file  ?Food Insecurity: Not on file  ?Transportation Needs: Not on file  ?Physical Activity: Not on file  ?Stress: Not on file  ?Social Connections: Not on file  ?Intimate Partner Violence: Not on file  ? ? ?ROS ?As per hpi  ? ?Objective  ? ?Vitals as reported by the patient: ?There were no vitals filed for this visit. ? ?Mia Wilkinson was seen today for referral. ? ?Diagnoses and all orders for this visit: ? ?Abdominal ultrasound, abnormal ?-     CT Abdomen Pelvis W Contrast; Future ? ? ? ?PLAN ?Ct abd/pelv w contrast. ?Refer based on findings  ?Patient encouraged to call clinic with any questions, comments, or concerns. ? ?I discussed the assessment and treatment plan with the patient. The patient was provided an opportunity to ask questions and all were answered. The patient agreed with the plan and demonstrated an understanding of the instructions. ?  ?The patient was advised to call back or seek an in-person evaluation if the symptoms worsen or if the condition fails to improve as anticipated. ? ?I provided 16 minutes of non-face-to-face time during this encounter. ? ?[name], NP ? ?

## 2021-11-09 ENCOUNTER — Telehealth: Payer: Medicaid Other | Admitting: Registered Nurse

## 2021-11-09 ENCOUNTER — Ambulatory Visit
Admission: RE | Admit: 2021-11-09 | Discharge: 2021-11-09 | Disposition: A | Discharge status: Home / Self Care | Payer: Medicaid Other | Source: Ambulatory Visit | Attending: Registered Nurse | Admitting: Registered Nurse

## 2021-11-09 DIAGNOSIS — R935 Abnormal findings on diagnostic imaging of other abdominal regions, including retroperitoneum: Secondary | ICD-10-CM

## 2021-11-10 ENCOUNTER — Telehealth: Payer: Self-pay

## 2021-11-10 NOTE — Telephone Encounter (Signed)
Fabi I am forwarding this message so that you can take a look at previous messages as well. I was standing there for this call and Tammy was not rude in anyway just letting the patient know that she needed to send a message back , and that she would also talk to Seibert because she would have to do another PA for this patient since she declined the Contrast and IV at [facility] imaging. Me and another patient could hear her through the phone basically being rude to Hope before hanging up. ?

## 2021-11-10 NOTE — Telephone Encounter (Signed)
Caller name:Rakisha Koudelka  ? ?On DPR? :Yes ? ?Call back number:[phone] ? ?Provider they see: Delfino Lovett ? ?Reason for call:Pt is calling said she called yesterday afternoon and said to check her my chart that someone will call her? She went to the hospital yesterday and she was only able to drink contrast of half a bottle pt got to the hospital and told them she could not do a IV. GI called and spoke with Danae Chen yesterday and she told them per Delfino Lovett that the patient needed to go on and leave the hospital. Danae Chen could not get pre approval for Medicaid that fast on CT without contrast. Pt hung up today  ? ?

## 2021-11-11 ENCOUNTER — Telehealth (INDEPENDENT_AMBULATORY_CARE_PROVIDER_SITE_OTHER): Payer: Medicaid Other | Admitting: Registered Nurse

## 2021-11-11 ENCOUNTER — Other Ambulatory Visit: Payer: Self-pay

## 2021-11-11 ENCOUNTER — Encounter: Payer: Self-pay | Admitting: Registered Nurse

## 2021-11-11 DIAGNOSIS — B3731 Acute candidiasis of vulva and vagina: Secondary | ICD-10-CM | POA: Diagnosis not present

## 2021-11-11 DIAGNOSIS — R935 Abnormal findings on diagnostic imaging of other abdominal regions, including retroperitoneum: Secondary | ICD-10-CM | POA: Diagnosis not present

## 2021-11-11 MED ORDER — FLUCONAZOLE 150 MG PO TABS: 150.0000 mg | ORAL_TABLET | ORAL | 0 refills | Status: DC

## 2021-11-11 NOTE — Patient Instructions (Signed)
° ° ° °  If you have lab work done today you will be contacted with your lab results within the next 2 weeks.  If you have not heard from us then please contact us. The fastest way to get your results is to register for My Chart. ° ° °IF you received an x-ray today, you will receive an invoice from [facility]. Please contact [facility] at [phone] with questions or concerns regarding your invoice.  ° °IF you received labwork today, you will receive an invoice from [facility]. Please contact [facility] at [phone] with questions or concerns regarding your invoice.  ° °Our billing staff will not be able to assist you with questions regarding bills from these companies. ° °You will be contacted with the lab results as soon as they are available. The fastest way to get your results is to activate your My Chart account. Instructions are located on the last page of this paperwork. If you have not heard from us regarding the results in 2 weeks, please contact this office. °  ° ° ° °

## 2021-11-11 NOTE — Progress Notes (Unsigned)
Telemedicine Encounter- SOAP NOTE Established Patient  This telephone encounter was conducted with the patient's (or proxy's) verbal consent via audio telecommunications: {yes/no:20286:::1} Patient was instructed to have this encounter in a suitably private space; and to only have persons present to whom they give permission to participate. In addition, patient identity was confirmed by use of name plus two identifiers (DOB and address).  I discussed the limitations, risks, security and privacy concerns of performing an evaluation and management service by telephone and the availability of in person appointments. I also discussed with the patient that there may be a patient responsible charge related to this service. The patient expressed understanding and agreed to proceed.  I spent a total of {TIME; 0 MIN TO 60 [phone]:::1} talking with the patient or their proxy.  Patient at home Provider in office  Participants: Mia Sportsman, NP and Mia Wilkinson  Chief Complaint  Patient presents with   Abdominal Pain    Patient state she is still having abdominal pain and need a referral for surgery.     Subjective   Mia Wilkinson is a 36 y.o. established patient. Telephone visit today for  HPI   Patient Active Problem List   Diagnosis Date Noted   Labor and delivery, indication for care 03/30/2020   Unwanted fertility 02/11/2020   Headache in pregnancy, antepartum, third trimester 01/08/2020   Maternal obesity affecting pregnancy, antepartum 11/14/2019   Frequent headaches 11/14/2019   Supervision of high risk pregnancy, antepartum 09/09/2019   Hernia, ventral 09/09/2019   History of cesarean delivery, currently pregnant 12/18/2013    Past Medical History:  Diagnosis Date   Anemia    Dysrhythmia    tachycardia with pregnancy   GERD (gastroesophageal reflux disease)    with pregnancy    Current Outpatient Medications  Medication Sig Dispense Refill    clotrimazole (LOTRIMIN) 1 % cream Apply 1 application topically 2 (two) times daily. 30 g 0   clotrimazole-betamethasone (LOTRISONE) cream Apply 1 application topically daily. Vaginal application daily for 2-3 days total. 30 g 0   FERROUSUL 325 (65 Fe) MG tablet Take 1 tablet (325 mg total) by mouth every other day. 45 tablet 3   fluconazole (DIFLUCAN) 150 MG tablet Take 1 tablet (150 mg total) by mouth every 3 (three) days. 3 tablet 0   fluocinonide-emollient (LIDEX-E) 0.05 % cream Apply 1 application topically 2 (two) times daily. 60 g 0   ibuprofen (ADVIL) 600 MG tablet Take 1 tablet (600 mg total) by mouth every 6 (six) hours as needed. 30 tablet 0   Polyethylene Glycol 3350 POWD 17 g by Does not apply route daily as needed. 1000 g 2   psyllium (REGULOID) 0.52 g capsule Take 1-2 capsules (0.52-1.04 g total) by mouth daily. 180 capsule 3   No current facility-administered medications for this visit.    No Known Allergies  Social History   Socioeconomic History   Marital status: Single    Spouse name: Not on file   Number of children: Not on file   Years of education: Not on file   Highest education level: Not on file  Occupational History   Not on file  Tobacco Use   Smoking status: Never   Smokeless tobacco: Never  Vaping Use   Vaping Use: Never used  Substance and Sexual Activity   Alcohol use: No   Drug use: No   Sexual activity: Yes    Birth control/protection: None  Other Topics Concern  Not on file  Social History Narrative   Not on file   Social Determinants of Health   Financial Resource Strain: Not on file  Food Insecurity: Not on file  Transportation Needs: Not on file  Physical Activity: Not on file  Stress: Not on file  Social Connections: Not on file  Intimate Partner Violence: Not on file    ROS  Objective   Vitals as reported by the patient: There were no vitals filed for this visit.  Mia Wilkinson was seen today for abdominal pain.  Diagnoses  and all orders for this visit:  Abdominal ultrasound, abnormal -     Ambulatory referral to General Surgery  Vagina, candidiasis -     fluconazole (DIFLUCAN) 150 MG tablet; Take 1 tablet (150 mg total) by mouth every 3 (three) days.     I discussed the assessment and treatment plan with the patient. The patient was provided an opportunity to ask questions and all were answered. The patient agreed with the plan and demonstrated an understanding of the instructions.   The patient was advised to call back or seek an in-person evaluation if the symptoms worsen or if the condition fails to improve as anticipated.  I provided *** minutes of non-face-to-face time during this encounter.  Mia Agee, NP  Primary Care at [facility]

## 2021-11-11 NOTE — Telephone Encounter (Signed)
Have appt with patient today. ?Will discuss ? ?Thanks, ? ?Rich

## 2021-11-25 DIAGNOSIS — R935 Abnormal findings on diagnostic imaging of other abdominal regions, including retroperitoneum: Secondary | ICD-10-CM | POA: Diagnosis not present

## 2021-12-07 DIAGNOSIS — Z9889 Other specified postprocedural states: Secondary | ICD-10-CM | POA: Diagnosis not present

## 2021-12-07 DIAGNOSIS — K429 Umbilical hernia without obstruction or gangrene: Secondary | ICD-10-CM | POA: Diagnosis not present

## 2021-12-07 DIAGNOSIS — K8020 Calculus of gallbladder without cholecystitis without obstruction: Secondary | ICD-10-CM | POA: Diagnosis not present

## 2021-12-07 DIAGNOSIS — R935 Abnormal findings on diagnostic imaging of other abdominal regions, including retroperitoneum: Secondary | ICD-10-CM | POA: Diagnosis not present

## 2021-12-07 DIAGNOSIS — M6208 Separation of muscle (nontraumatic), other site: Secondary | ICD-10-CM | POA: Diagnosis not present

## 2021-12-22 ENCOUNTER — Telehealth: Payer: Medicaid Other | Admitting: Registered Nurse

## 2021-12-24 ENCOUNTER — Telehealth (INDEPENDENT_AMBULATORY_CARE_PROVIDER_SITE_OTHER): Payer: Medicaid Other | Admitting: Registered Nurse

## 2021-12-24 ENCOUNTER — Other Ambulatory Visit: Payer: Self-pay

## 2021-12-24 ENCOUNTER — Encounter: Payer: Self-pay | Admitting: Registered Nurse

## 2021-12-24 DIAGNOSIS — R519 Headache, unspecified: Secondary | ICD-10-CM | POA: Diagnosis not present

## 2021-12-24 DIAGNOSIS — F411 Generalized anxiety disorder: Secondary | ICD-10-CM

## 2021-12-24 MED ORDER — BUTALBITAL-APAP-CAFFEINE 50-325-40 MG PO TABS: 1.0000 | ORAL_TABLET | Freq: Four times a day (QID) | ORAL | 0 refills | Status: DC | PRN

## 2021-12-24 MED ORDER — TRAZODONE HCL 50 MG PO TABS: 25.0000 mg | ORAL_TABLET | Freq: Every evening | ORAL | 3 refills | Status: DC | PRN

## 2021-12-24 MED ORDER — SERTRALINE HCL 50 MG PO TABS: ORAL_TABLET | ORAL | 0 refills | Status: DC

## 2021-12-24 NOTE — Patient Instructions (Addendum)
Mia Wilkinson -  ? ?Great to speak with you ? ?Sertraline - every day anxiety medication ? ?Trazodone - as needed sleep medication ? ?Fioricet - as needed headache medication. ? ?Let's talk in around 6 weeks for med check. Can be virtual ? ?Thanks, ? ?Rich  ? ? ? ?If you have lab work done today you will be contacted with your lab results within the next 2 weeks.  If you have not heard from [place] then please contact us. The fastest way to get your results is to register for My Chart. ? ? ?IF you received an x-ray today, you will receive an invoice from [facility]. Please contact [facility] at [phone] with questions or concerns regarding your invoice.  ? ?IF you received labwork today, you will receive an invoice from [facility]. Please contact [facility] at [phone] with questions or concerns regarding your invoice.  ? ?Our billing staff will not be able to assist you with questions regarding bills from these companies. ? ?You will be contacted with the lab results as soon as they are available. The fastest way to get your results is to activate your My Chart account. Instructions are located on the last page of this paperwork. If you have not heard from [place] regarding the results in 2 weeks, please contact this office. ?  ? ? ?

## 2021-12-24 NOTE — Progress Notes (Signed)
? ? ?Telemedicine Encounter- SOAP NOTE Established Patient ? ?This telephone encounter was conducted with the patient's (or proxy's) verbal consent via audio telecommunications: yes/no: Yes ?Patient was instructed to have this encounter in a suitably private space; and to only have persons present to whom they give permission to participate. In addition, patient identity was confirmed by use of name plus two identifiers (DOB and address).  I discussed the limitations, risks, security and privacy concerns of performing an evaluation and management service by telephone and the availability of in person appointments. I also discussed with the patient that there may be a patient responsible charge related to this service. The patient expressed understanding and agreed to proceed. ? ?I spent a total of 14 minutes talking with the patient or their proxy. ? ?Patient at home ?Provider in office ? ?Participants: [name], NP and Philbert Riser [place] ? ?Chief Complaint  ?Patient presents with  ? Headache  ?  Patient states she has been having having headaches, stomach pain and not being able to sleep for a few weeks.  ? ? ?Subjective  ? ?Mia Wilkinson is a 36 y.o. established patient. Telephone visit today for ongoing headaches ? ?HPI ?Worse at night. Sometimes during the day.  ? ?She does note a lot of stress going on. She does feel more anxious ?Notes apneic like episodes of choking sensation as she dozes off. ?She does not wake with headaches or dry mouth. She denies snoring (to her knowledge. ? ?Has used zolpidem for sleep during a hospitalization in the past with good effect.  ? ?She has not been on anxiolytics. ? ?She denies any other neuro or cognitive symptoms with the headaches.  ? ?Patient Active Problem List  ? Diagnosis Date Noted  ? Labor and delivery, indication for care 03/30/2020  ? Unwanted fertility 02/11/2020  ? Headache in pregnancy, antepartum, third trimester 01/08/2020  ? Maternal obesity  affecting pregnancy, antepartum 11/14/2019  ? Frequent headaches 11/14/2019  ? Supervision of high risk pregnancy, antepartum 09/09/2019  ? Hernia, ventral 09/09/2019  ? History of cesarean delivery, currently pregnant 12/18/2013  ? ? ?Past Medical History:  ?Diagnosis Date  ? Anemia   ? Dysrhythmia   ? tachycardia with pregnancy  ? GERD (gastroesophageal reflux disease)   ? with pregnancy  ? ? ?Current Outpatient Medications  ?Medication Sig Dispense Refill  ? butalbital-acetaminophen-caffeine (FIORICET) 50-325-40 MG tablet Take 1 tablet by mouth every 6 (six) hours as needed for headache. 14 tablet 0  ? sertraline (ZOLOFT) 50 MG tablet Take 0.5 tablets (25 mg total) by mouth daily for 8 days, THEN 1 tablet (50 mg total) daily. 90 tablet 0  ? traZODone (DESYREL) 50 MG tablet Take 0.5-1 tablets (25-50 mg total) by mouth at bedtime as needed for sleep. 30 tablet 3  ? clotrimazole (LOTRIMIN) 1 % cream Apply 1 application topically 2 (two) times daily. 30 g 0  ? clotrimazole-betamethasone (LOTRISONE) cream Apply 1 application topically daily. Vaginal application daily for 2-3 days total. 30 g 0  ? FERROUSUL 325 (65 Fe) MG tablet Take 1 tablet (325 mg total) by mouth every other day. 45 tablet 3  ? fluconazole (DIFLUCAN) 150 MG tablet Take 1 tablet (150 mg total) by mouth every 3 (three) days. 3 tablet 0  ? fluocinonide-emollient (LIDEX-E) 0.05 % cream Apply 1 application topically 2 (two) times daily. 60 g 0  ? ibuprofen (ADVIL) 600 MG tablet Take 1 tablet (600 mg total) by mouth every 6 (six) hours  as needed. 30 tablet 0  ? Polyethylene Glycol 3350 POWD 17 g by Does not apply route daily as needed. 1000 g 2  ? psyllium (REGULOID) 0.52 g capsule Take 1-2 capsules (0.52-1.04 g total) by mouth daily. 180 capsule 3  ? ?No current facility-administered medications for this visit.  ? ? ?No Known Allergies ? ?Social History  ? ?Socioeconomic History  ? Marital status: Single  ?  Spouse name: Not on file  ? Number of  children: Not on file  ? Years of education: Not on file  ? Highest education level: Not on file  ?Occupational History  ? Not on file  ?Tobacco Use  ? Smoking status: Never  ? Smokeless tobacco: Never  ?Vaping Use  ? Vaping Use: Never used  ?Substance and Sexual Activity  ? Alcohol use: No  ? Drug use: No  ? Sexual activity: Yes  ?  Birth control/protection: None  ?Other Topics Concern  ? Not on file  ?Social History Narrative  ? Not on file  ? ?Social Determinants of Health  ? ?Financial Resource Strain: Not on file  ?Food Insecurity: Not on file  ?Transportation Needs: Not on file  ?Physical Activity: Not on file  ?Stress: Not on file  ?Social Connections: Not on file  ?Intimate Partner Violence: Not on file  ? ? ?ROS ?Per hpi  ? ?Objective  ? ?Vitals as reported by the patient: ?There were no vitals filed for this visit. ? ?Mia Wilkinson was seen today for headache. ? ?Diagnoses and all orders for this visit: ? ?Frequent headaches ?-     butalbital-acetaminophen-caffeine (FIORICET) 50-325-40 MG tablet; Take 1 tablet by mouth every 6 (six) hours as needed for headache. ? ?GAD (generalized anxiety disorder) ?-     sertraline (ZOLOFT) 50 MG tablet; Take 0.5 tablets (25 mg total) by mouth daily for 8 days, THEN 1 tablet (50 mg total) daily. ?-     traZODone (DESYREL) 50 MG tablet; Take 0.5-1 tablets (25-50 mg total) by mouth at bedtime as needed for sleep. ? ? ? ?PLAN ?Start sertraline 25mg  po qd. If tolerating well, increase to 50mg  po qd. Med check in 5-6 weeks ?Trazodone 25-50mg  po qhs prn. ?Fioricet refilled ?Reviewed risks, benefits, and side effects of all meds, pt voices understanding. ?Patient encouraged to call clinic with any questions, comments, or concerns. ? ?I discussed the assessment and treatment plan with the patient. The patient was provided an opportunity to ask questions and all were answered. The patient agreed with the plan and demonstrated an understanding of the instructions. ?  ?The patient was  advised to call back or seek an in-person evaluation if the symptoms worsen or if the condition fails to improve as anticipated. ? ?I provided 14 minutes of non-face-to-face time during this encounter. ? ? , NP ? ?

## 2022-01-13 DIAGNOSIS — M6208 Separation of muscle (nontraumatic), other site: Secondary | ICD-10-CM | POA: Diagnosis not present

## 2022-01-13 DIAGNOSIS — K429 Umbilical hernia without obstruction or gangrene: Secondary | ICD-10-CM | POA: Diagnosis not present

## 2022-01-17 ENCOUNTER — Other Ambulatory Visit: Payer: Self-pay | Admitting: Registered Nurse

## 2022-01-17 DIAGNOSIS — B3731 Acute candidiasis of vulva and vagina: Secondary | ICD-10-CM

## 2022-01-18 MED ORDER — FLUCONAZOLE 150 MG PO TABS: 150.0000 mg | ORAL_TABLET | ORAL | 0 refills | Status: DC

## 2022-01-18 NOTE — Telephone Encounter (Signed)
Patient is requesting a refill of the following medications: ?Requested Prescriptions  ? ?Pending Prescriptions Disp Refills  ? fluconazole (DIFLUCAN) 150 MG tablet 3 tablet 0  ?  Sig: Take 1 tablet (150 mg total) by mouth every 3 (three) days.  ? ? ?Date of patient request: 01/18/22 ?Last office visit: 12/24/21 ?Date of last refill: 11/11/21 ?Last refill amount: 3 tablets ? ?

## 2022-02-11 DIAGNOSIS — K429 Umbilical hernia without obstruction or gangrene: Secondary | ICD-10-CM | POA: Diagnosis not present

## 2022-02-11 DIAGNOSIS — M6208 Separation of muscle (nontraumatic), other site: Secondary | ICD-10-CM | POA: Diagnosis not present

## 2022-02-13 ENCOUNTER — Other Ambulatory Visit: Payer: Self-pay | Admitting: Registered Nurse

## 2022-02-13 DIAGNOSIS — R519 Headache, unspecified: Secondary | ICD-10-CM

## 2022-02-14 MED ORDER — IBUPROFEN 600 MG PO TABS: 600.0000 mg | ORAL_TABLET | Freq: Four times a day (QID) | ORAL | 0 refills | Status: DC | PRN

## 2022-03-10 DIAGNOSIS — K429 Umbilical hernia without obstruction or gangrene: Secondary | ICD-10-CM | POA: Diagnosis not present

## 2022-03-20 IMAGING — US US ABDOMEN LIMITED
1 series · 11 of 11 positions shown · non-contrast
Comparison: None.

CLINICAL DATA: Ventral hernia size and dimensions.

EXAM:
LIMITED ULTRASOUND OF ABDOMINAL SOFT TISSUES
TECHNIQUE: Ultrasound examination of the abdominal wall soft tissues was
performed in the area of clinical concern.

[Series 1: us abdomen limited · 0.31mm/px · 11 acquisitions, 11 frames shown]
[im 1/11]
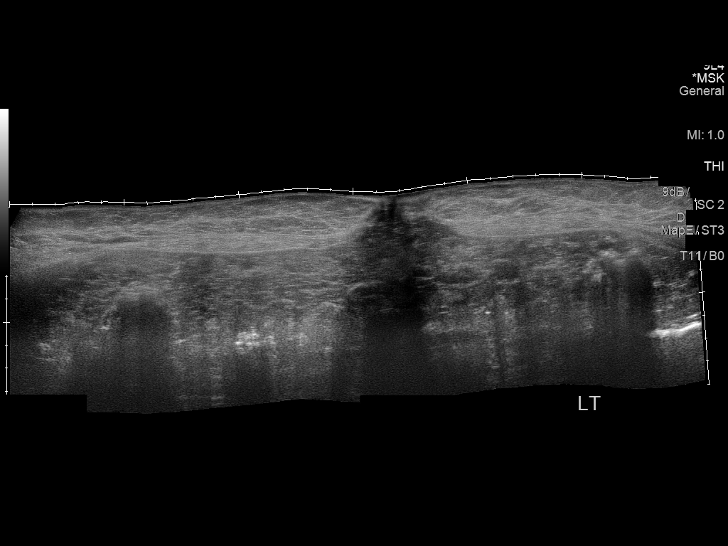
[im 2/11]
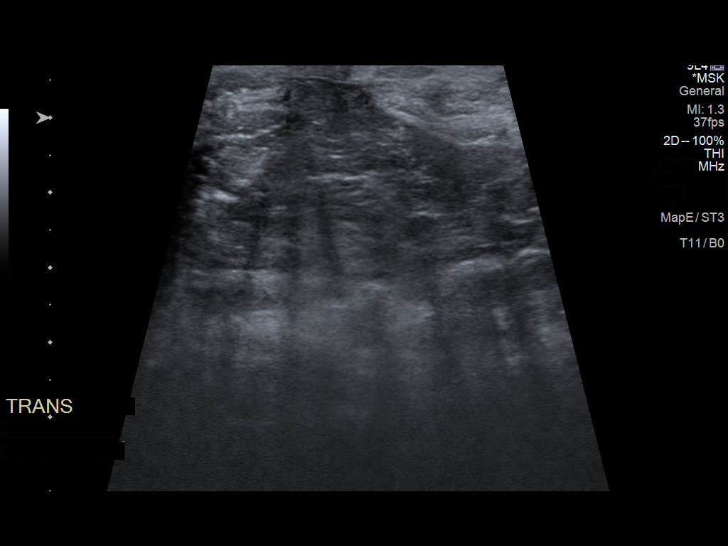
[im 3/11]
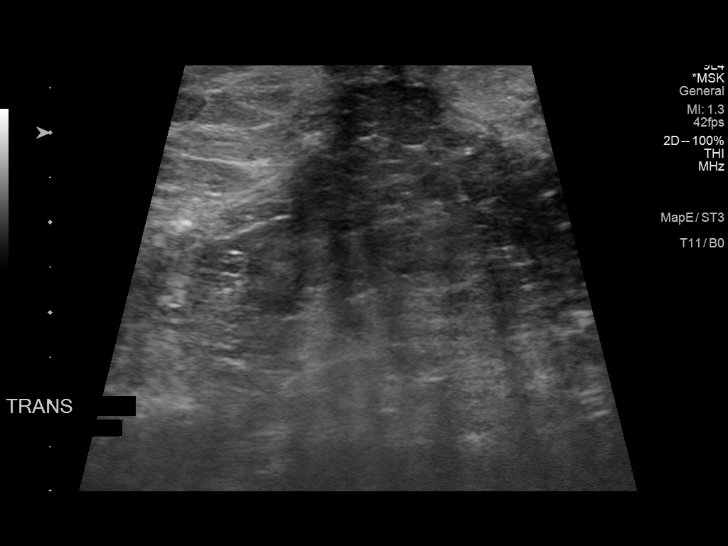
[im 4/11]
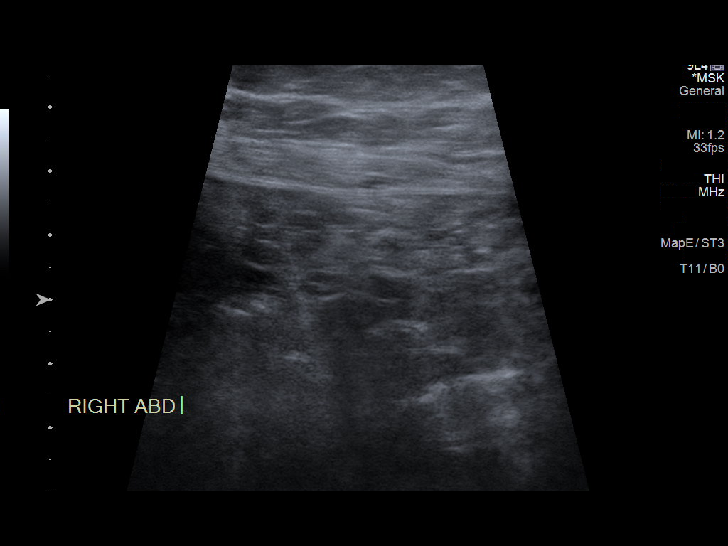
[im 5/11]
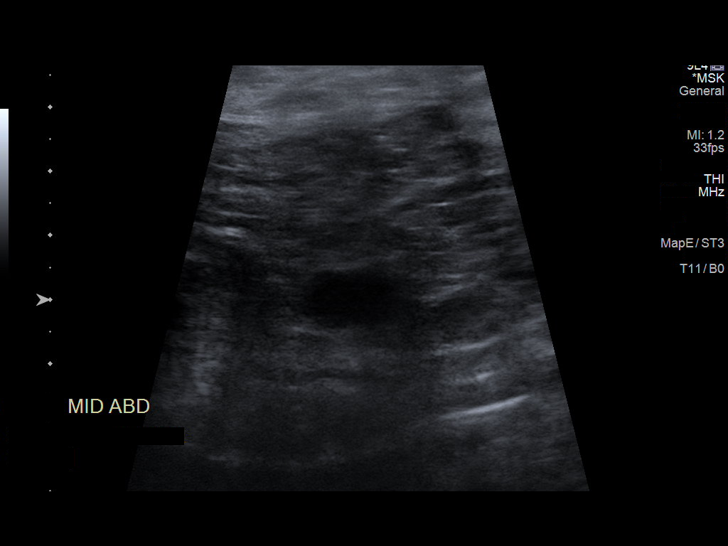
[im 6/11]
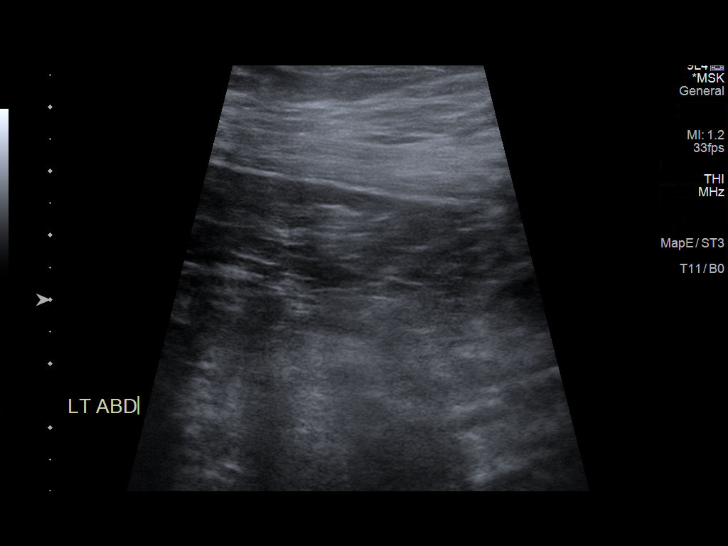
[im 7/11]
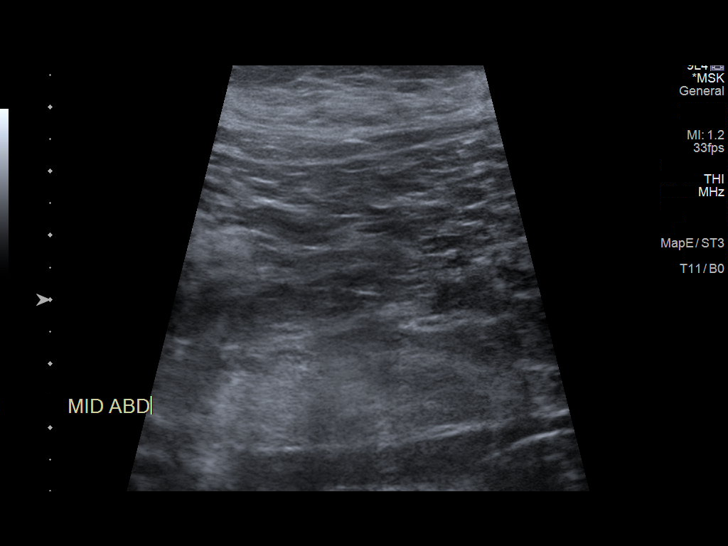
[im 8/11]
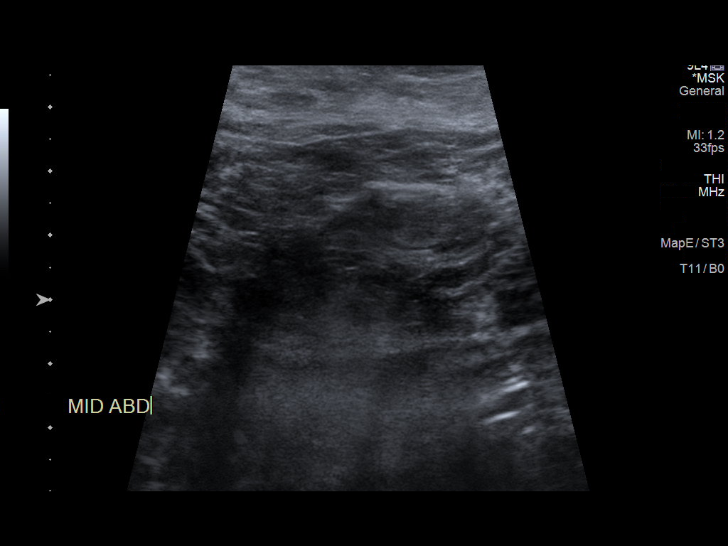
[im 9/11]
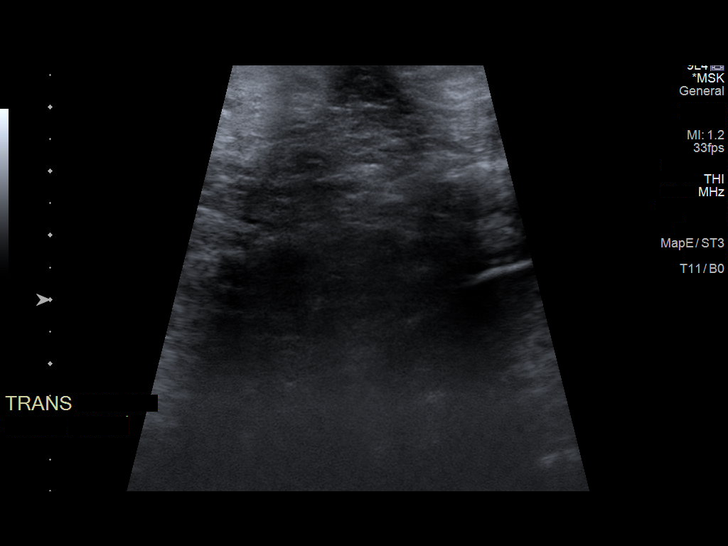
[im 10/11]
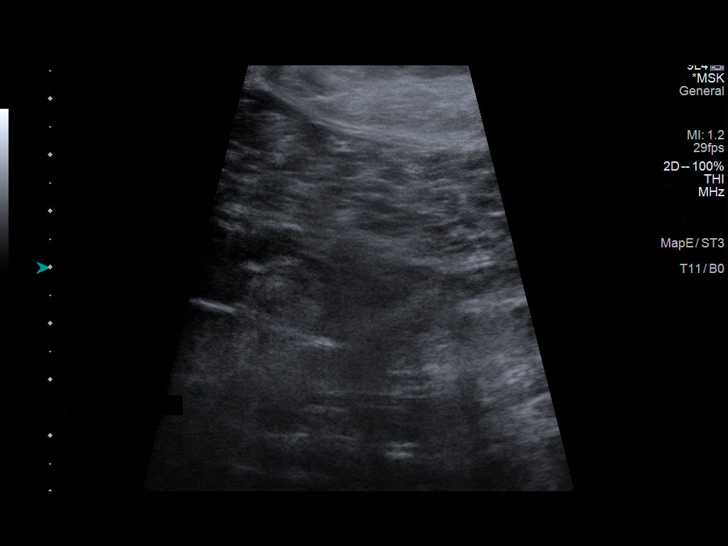
[im 11/11]
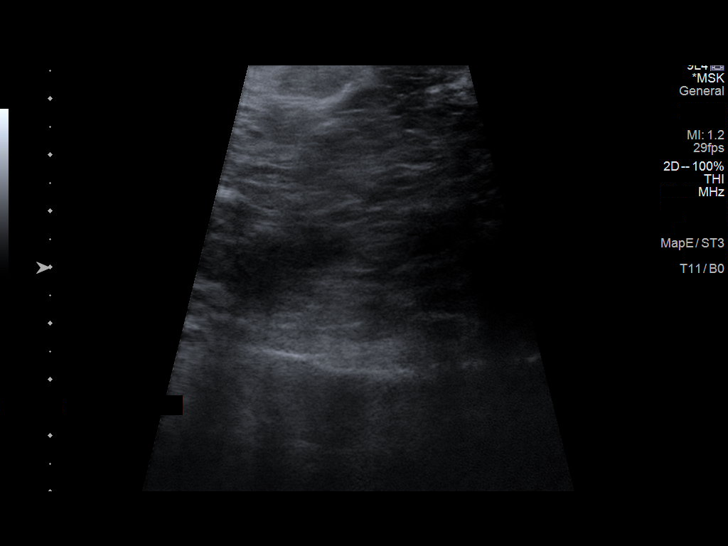

[11 of 11 positions shown; findings below may reference images not displayed]

FINDINGS: There is a 12-13 mm defect at the umbilicus, however no herniation
of intra-abdominal fat or evidence of bowel involvement. No other
ventral abdominal hernia is seen by ultrasound.
IMPRESSION: 12-13 mm defect at the umbilicus, however no herniation of
intra-abdominal fat or evidence of bowel involvement. CT could be
considered for more detailed assessment of the anterior abdominal
wall.

## 2022-04-07 ENCOUNTER — Telehealth (INDEPENDENT_AMBULATORY_CARE_PROVIDER_SITE_OTHER): Payer: Medicaid Other | Admitting: Registered Nurse

## 2022-04-07 ENCOUNTER — Other Ambulatory Visit: Payer: Self-pay

## 2022-04-07 DIAGNOSIS — R519 Headache, unspecified: Secondary | ICD-10-CM | POA: Diagnosis not present

## 2022-04-07 DIAGNOSIS — B3731 Acute candidiasis of vulva and vagina: Secondary | ICD-10-CM | POA: Diagnosis not present

## 2022-04-07 MED ORDER — FLUCONAZOLE 150 MG PO TABS: 150.0000 mg | ORAL_TABLET | ORAL | 3 refills | Status: DC

## 2022-04-07 MED ORDER — BUTALBITAL-APAP-CAFFEINE 50-325-40 MG PO TABS: 1.0000 | ORAL_TABLET | Freq: Four times a day (QID) | ORAL | 0 refills | Status: DC | PRN

## 2022-04-07 MED ORDER — IBUPROFEN 600 MG PO TABS: 600.0000 mg | ORAL_TABLET | Freq: Four times a day (QID) | ORAL | 0 refills | Status: DC | PRN

## 2022-04-07 NOTE — Patient Instructions (Signed)
° ° ° °  If you have lab work done today you will be contacted with your lab results within the next 2 weeks.  If you have not heard from us then please contact us. The fastest way to get your results is to register for My Chart. ° ° °IF you received an x-ray today, you will receive an invoice from [facility]. Please contact [facility] at [phone] with questions or concerns regarding your invoice.  ° °IF you received labwork today, you will receive an invoice from [facility]. Please contact [facility] at [phone] with questions or concerns regarding your invoice.  ° °Our billing staff will not be able to assist you with questions regarding bills from these companies. ° °You will be contacted with the lab results as soon as they are available. The fastest way to get your results is to activate your My Chart account. Instructions are located on the last page of this paperwork. If you have not heard from us regarding the results in 2 weeks, please contact this office. °  ° ° ° °

## 2022-04-07 NOTE — Progress Notes (Signed)
Telemedicine Encounter- SOAP NOTE Established Patient  This telephone encounter was conducted with the patient's (or proxy's) verbal consent via audio telecommunications: yes/no: Yes Patient was instructed to have this encounter in a suitably private space; and to only have persons present to whom they give permission to participate. In addition, patient identity was confirmed by use of name plus two identifiers (DOB and address).  I discussed the limitations, risks, security and privacy concerns of performing an evaluation and management service by telephone and the availability of in person appointments. I also discussed with the patient that there may be a patient responsible charge related to this service. The patient expressed understanding and agreed to proceed.  I spent a total of 15 minutes talking with the patient or their proxy.  Patient at home Provider in office  Participants: [name], NP and [facility]  Chief Complaint  Patient presents with   Follow-up    Patient states she is following up but wants to only discuss with PCP    Subjective   Mia Wilkinson is a 36 y.o. established patient. Telephone visit today for med refills  HPI  Vaginal candidiasis Recurrent. She has to use 3 doses of fluconazole to treat She is working to find a gyn to treat this. Will provide temporary refill.  Migraines Tx well with fioricet No AE. Sparing use.  Sometimes will use ibuprofen instead Tries to avoid headache triggers. No new neuro symptoms or changes to migraines  Patient Active Problem List   Diagnosis Date Noted   Labor and delivery, indication for care 03/30/2020   Unwanted fertility 02/11/2020   Headache in pregnancy, antepartum, third trimester 01/08/2020   Maternal obesity affecting pregnancy, antepartum 11/14/2019   Frequent headaches 11/14/2019   Supervision of high risk pregnancy, antepartum 09/09/2019   Hernia, ventral 09/09/2019    History of cesarean delivery, currently pregnant 12/18/2013    Past Medical History:  Diagnosis Date   Anemia    Dysrhythmia    tachycardia with pregnancy   GERD (gastroesophageal reflux disease)    with pregnancy    Current Outpatient Medications  Medication Sig Dispense Refill   clotrimazole (LOTRIMIN) 1 % cream Apply 1 application topically 2 (two) times daily. 30 g 0   clotrimazole-betamethasone (LOTRISONE) cream Apply 1 application topically daily. Vaginal application daily for 2-3 days total. 30 g 0   fluconazole (DIFLUCAN) 150 MG tablet Take 1 tablet (150 mg total) by mouth every 3 (three) days. 3 tablet 3   fluocinonide-emollient (LIDEX-E) 0.05 % cream Apply 1 application topically 2 (two) times daily. 60 g 0   Polyethylene Glycol 3350 POWD 17 g by Does not apply route daily as needed. 1000 g 2   psyllium (REGULOID) 0.52 g capsule Take 1-2 capsules (0.52-1.04 g total) by mouth daily. 180 capsule 3   traZODone (DESYREL) 50 MG tablet Take 0.5-1 tablets (25-50 mg total) by mouth at bedtime as needed for sleep. 30 tablet 3   butalbital-acetaminophen-caffeine (FIORICET) 50-325-40 MG tablet Take 1 tablet by mouth every 6 (six) hours as needed for headache. 14 tablet 0   FERROUSUL 325 (65 Fe) MG tablet Take 1 tablet (325 mg total) by mouth every other day. 45 tablet 3   ibuprofen (ADVIL) 600 MG tablet Take 1 tablet (600 mg total) by mouth every 6 (six) hours as needed. 30 tablet 0   sertraline (ZOLOFT) 50 MG tablet Take 0.5 tablets (25 mg total) by mouth daily for 8 days, THEN 1  tablet (50 mg total) daily. 90 tablet 0   No current facility-administered medications for this visit.    No Known Allergies  Social History   Socioeconomic History   Marital status: Single    Spouse name: Not on file   Number of children: Not on file   Years of education: Not on file   Highest education level: Not on file  Occupational History   Not on file  Tobacco Use   Smoking status: Never    Smokeless tobacco: Never  Vaping Use   Vaping Use: Never used  Substance and Sexual Activity   Alcohol use: No   Drug use: No   Sexual activity: Yes    Birth control/protection: None  Other Topics Concern   Not on file  Social History Narrative   Not on file   Social Determinants of Health   Financial Resource Strain: Not on file  Food Insecurity: Not on file  Transportation Needs: Not on file  Physical Activity: Not on file  Stress: Not on file  Social Connections: Not on file  Intimate Partner Violence: Not on file    Review of Systems  Constitutional: Negative.   HENT: Negative.    Eyes: Negative.   Respiratory: Negative.    Cardiovascular: Negative.   Gastrointestinal: Negative.   Genitourinary: Negative.   Musculoskeletal: Negative.   Skin: Negative.   Neurological: Negative.   Endo/Heme/Allergies: Negative.   Psychiatric/Behavioral: Negative.    All other systems reviewed and are negative.   Objective   Vitals as reported by the patient: There were no vitals filed for this visit.  Mia Wilkinson was seen today for follow-up.  Diagnoses and all orders for this visit:  Vagina, candidiasis -     fluconazole (DIFLUCAN) 150 MG tablet; Take 1 tablet (150 mg total) by mouth every 3 (three) days.  Frequent headaches -     butalbital-acetaminophen-caffeine (FIORICET) 50-325-40 MG tablet; Take 1 tablet by mouth every 6 (six) hours as needed for headache. -     ibuprofen (ADVIL) 600 MG tablet; Take 1 tablet (600 mg total) by mouth every 6 (six) hours as needed.    PLAN Refill meds as above Pdmp reviewed Patient encouraged to call clinic with any questions, comments, or concerns.   I discussed the assessment and treatment plan with the patient. The patient was provided an opportunity to ask questions and all were answered. The patient agreed with the plan and demonstrated an understanding of the instructions.   The patient was advised to call back or seek an  in-person evaluation if the symptoms worsen or if the condition fails to improve as anticipated.  I provided 15 minutes of non-face-to-face time during this encounter.  [name], NP

## 2022-05-05 ENCOUNTER — Other Ambulatory Visit: Payer: Self-pay | Admitting: Family Medicine

## 2022-05-05 DIAGNOSIS — F411 Generalized anxiety disorder: Secondary | ICD-10-CM

## 2022-05-06 MED ORDER — SERTRALINE HCL 50 MG PO TABS: ORAL_TABLET | ORAL | 0 refills | Status: DC

## 2022-08-15 DIAGNOSIS — N898 Other specified noninflammatory disorders of vagina: Secondary | ICD-10-CM | POA: Diagnosis not present

## 2022-08-15 DIAGNOSIS — G4700 Insomnia, unspecified: Secondary | ICD-10-CM | POA: Diagnosis not present

## 2022-08-15 DIAGNOSIS — F419 Anxiety disorder, unspecified: Secondary | ICD-10-CM | POA: Diagnosis not present

## 2022-10-11 DIAGNOSIS — N3090 Cystitis, unspecified without hematuria: Secondary | ICD-10-CM | POA: Diagnosis not present

## 2022-10-20 DIAGNOSIS — R3989 Other symptoms and signs involving the genitourinary system: Secondary | ICD-10-CM | POA: Diagnosis not present

## 2022-10-20 DIAGNOSIS — R030 Elevated blood-pressure reading, without diagnosis of hypertension: Secondary | ICD-10-CM | POA: Diagnosis not present

## 2022-10-20 DIAGNOSIS — Z6839 Body mass index (BMI) 39.0-39.9, adult: Secondary | ICD-10-CM | POA: Diagnosis not present

## 2023-02-02 DIAGNOSIS — K469 Unspecified abdominal hernia without obstruction or gangrene: Secondary | ICD-10-CM | POA: Diagnosis not present

## 2023-02-02 DIAGNOSIS — Z6838 Body mass index (BMI) 38.0-38.9, adult: Secondary | ICD-10-CM | POA: Diagnosis not present

## 2023-02-23 DIAGNOSIS — Z6838 Body mass index (BMI) 38.0-38.9, adult: Secondary | ICD-10-CM | POA: Diagnosis not present

## 2023-02-23 DIAGNOSIS — R350 Frequency of micturition: Secondary | ICD-10-CM | POA: Diagnosis not present

## 2023-02-23 DIAGNOSIS — N390 Urinary tract infection, site not specified: Secondary | ICD-10-CM | POA: Diagnosis not present

## 2023-03-02 DIAGNOSIS — K469 Unspecified abdominal hernia without obstruction or gangrene: Secondary | ICD-10-CM | POA: Diagnosis not present

## 2023-03-23 DIAGNOSIS — K8020 Calculus of gallbladder without cholecystitis without obstruction: Secondary | ICD-10-CM | POA: Diagnosis not present

## 2023-03-23 DIAGNOSIS — E669 Obesity, unspecified: Secondary | ICD-10-CM | POA: Diagnosis not present

## 2023-04-23 ENCOUNTER — Encounter (HOSPITAL_BASED_OUTPATIENT_CLINIC_OR_DEPARTMENT_OTHER): Payer: Self-pay

## 2023-04-23 ENCOUNTER — Other Ambulatory Visit: Payer: Self-pay

## 2023-04-23 ENCOUNTER — Emergency Department (HOSPITAL_BASED_OUTPATIENT_CLINIC_OR_DEPARTMENT_OTHER): Admission: EM | Admit: 2023-04-23 | Payer: Medicaid Other | Source: Home / Self Care

## 2023-04-23 DIAGNOSIS — G43909 Migraine, unspecified, not intractable, without status migrainosus: Secondary | ICD-10-CM | POA: Diagnosis present

## 2023-04-23 DIAGNOSIS — M791 Myalgia, unspecified site: Secondary | ICD-10-CM | POA: Diagnosis not present

## 2023-04-23 DIAGNOSIS — G43809 Other migraine, not intractable, without status migrainosus: Secondary | ICD-10-CM | POA: Diagnosis not present

## 2023-04-23 DIAGNOSIS — Z20822 Contact with and (suspected) exposure to covid-19: Secondary | ICD-10-CM | POA: Diagnosis not present

## 2023-04-23 LAB — RESP PANEL BY RT-PCR (RSV, FLU A&B, COVID)  RVPGX2
Influenza A by PCR: NEGATIVE
Influenza B by PCR: NEGATIVE
Resp Syncytial Virus by PCR: NEGATIVE
SARS Coronavirus 2 by RT PCR: NEGATIVE

## 2023-04-23 MED ORDER — NAPROXEN 250 MG PO TABS
500.0000 mg | ORAL_TABLET | Freq: Once | ORAL | Status: AC
  Administered 2023-04-23: 500 mg via ORAL
  Filled 2023-04-23: qty 2

## 2023-04-23 MED ORDER — METOCLOPRAMIDE HCL 10 MG PO TABS
10.0000 mg | ORAL_TABLET | Freq: Once | ORAL | Status: AC
  Administered 2023-04-23: 10 mg via ORAL
  Filled 2023-04-23: qty 1

## 2023-04-23 MED ORDER — ACETAMINOPHEN 500 MG PO TABS
1000.0000 mg | ORAL_TABLET | Freq: Once | ORAL | Status: AC
  Administered 2023-04-23: 1000 mg via ORAL
  Filled 2023-04-23: qty 2

## 2023-04-23 MED ORDER — DIPHENHYDRAMINE HCL 25 MG PO CAPS
25.0000 mg | ORAL_CAPSULE | Freq: Once | ORAL | Status: AC
  Administered 2023-04-23: 25 mg via ORAL
  Filled 2023-04-23: qty 1

## 2023-04-23 NOTE — ED Notes (Signed)
Pt given apple sauce and graham crackers as requested per RN [phone]

## 2023-04-23 NOTE — ED Triage Notes (Addendum)
Generalized bodyaches since this am. Pt states "my body just feels so sluggish" and also endorses migraine

## 2023-04-24 LAB — PREGNANCY, URINE: Preg Test, Ur: NEGATIVE

## 2023-04-24 NOTE — ED Provider Notes (Signed)
[facility] Provider Note   CSN: [id number] Arrival date & time: 04/23/23  2234     History  Chief Complaint  Patient presents with   Bodyaches   Migraine    Mia Wilkinson is a 37 y.o. female.  The history is provided by the patient.  Headache Radiates to:  Does not radiate Onset quality:  Gradual Timing:  Constant Progression:  Unchanged Context: not intercourse, not loud noise and not straining   Relieved by:  Nothing Worsened by:  Nothing Ineffective treatments:  None tried Associated symptoms: myalgias   Associated symptoms: no abdominal pain, no blurred vision, no diarrhea, no dizziness, no fever, no neck pain, no neck stiffness, no numbness, no paresthesias, no photophobia, no seizures, no sore throat, no swollen glands, no syncope, no tingling, no URI, no visual change, no vomiting and no weakness   Risk factors: no family hx of SAH   Patient with body aches and a headache.  No cough.  Has migraines but usual medication did not help with this and body aches.      Past Medical History:  Diagnosis Date   Anemia    Dysrhythmia    tachycardia with pregnancy   GERD (gastroesophageal reflux disease)    with pregnancy     Home Medications Prior to Admission medications   Medication Sig Start Date End Date Taking? Authorizing Provider  butalbital-acetaminophen-caffeine (FIORICET) 50-325-40 MG tablet Take 1 tablet by mouth every 6 (six) hours as needed for headache. 04/07/22   [name], NP  clotrimazole (LOTRIMIN) 1 % cream Apply 1 application topically 2 (two) times daily. 10/18/21   [name], NP  clotrimazole-betamethasone (LOTRISONE) cream Apply 1 application topically daily. Vaginal application daily for 2-3 days total. 10/14/21   Shade Flood, MD  FERROUSUL 325 (65 Fe) MG tablet Take 1 tablet (325 mg total) by mouth every other day. 01/14/21 04/14/21  [name], NP  fluconazole (DIFLUCAN) 150  MG tablet Take 1 tablet (150 mg total) by mouth every 3 (three) days. 04/07/22   [name], NP  fluocinonide-emollient (LIDEX-E) 0.05 % cream Apply 1 application topically 2 (two) times daily. 10/12/21   [name], NP  ibuprofen (ADVIL) 600 MG tablet Take 1 tablet (600 mg total) by mouth every 6 (six) hours as needed. 04/07/22   [name], NP  Polyethylene Glycol 3350 POWD 17 g by Does not apply route daily as needed. 01/14/21   [name], NP  psyllium (REGULOID) 0.52 g capsule Take 1-2 capsules (0.52-1.04 g total) by mouth daily. 01/14/21   [name], NP  sertraline (ZOLOFT) 50 MG tablet Take 0.5 tablets (25 mg total) by mouth daily for 8 days, THEN 1 tablet (50 mg total) daily. 05/06/22 08/07/22  Eulis Foster, FNP  traZODone (DESYREL) 50 MG tablet Take 0.5-1 tablets (25-50 mg total) by mouth at bedtime as needed for sleep. 12/24/21   [name], NP      Allergies    Patient has no known allergies.    Review of Systems   Review of Systems  Constitutional:  Negative for fever.  HENT:  Negative for rhinorrhea and sore throat.   Eyes:  Negative for blurred vision and photophobia.  Cardiovascular:  Negative for syncope.  Gastrointestinal:  Negative for abdominal pain, diarrhea and vomiting.  Musculoskeletal:  Positive for myalgias. Negative for neck pain and neck stiffness.  Neurological:  Positive for headaches. Negative for dizziness, seizures, facial asymmetry, weakness, numbness  and paresthesias.  All other systems reviewed and are negative.   Physical Exam Updated Vital Signs BP 112/82 (BP Location: Left Arm)   Pulse (!) 102   Temp 98.3 F (36.8 C)   Resp 20   Ht 5\' 2"  (1.575 m)   Wt 99.3 kg   LMP 04/05/2023 (Approximate)   SpO2 99%   BMI 40.06 kg/m  Physical Exam Vitals and nursing note reviewed.  Constitutional:      General: She is not in acute distress.    Appearance: She is well-developed.  HENT:     Head: Normocephalic and atraumatic.      Nose: Nose normal.     Mouth/Throat:     Mouth: Mucous membranes are moist.     Pharynx: Oropharynx is clear.  Eyes:     Extraocular Movements: Extraocular movements intact.     Conjunctiva/sclera: Conjunctivae normal.     Pupils: Pupils are equal, round, and reactive to light.     Comments: No proptosis disk margins sharp   Cardiovascular:     Rate and Rhythm: Normal rate and regular rhythm.     Pulses: Normal pulses.     Heart sounds: Normal heart sounds.  Pulmonary:     Effort: Pulmonary effort is normal. No respiratory distress.     Breath sounds: Normal breath sounds.  Abdominal:     General: Bowel sounds are normal. There is no distension.     Palpations: Abdomen is soft.     Tenderness: There is no abdominal tenderness. There is no guarding or rebound.  Musculoskeletal:        General: Normal range of motion.     Cervical back: Normal range of motion and neck supple. No rigidity.  Lymphadenopathy:     Cervical: No cervical adenopathy.  Skin:    General: Skin is warm and dry.     Capillary Refill: Capillary refill takes less than 2 seconds.     Findings: No erythema or rash.  Neurological:     General: No focal deficit present.     Mental Status: She is oriented to person, place, and time.     Deep Tendon Reflexes: Reflexes normal.  Psychiatric:        Mood and Affect: Mood normal.     ED Results / Procedures / Treatments   Labs (all labs ordered are listed, but only abnormal results are displayed) Labs Reviewed  RESP PANEL BY RT-PCR (RSV, FLU A&B, COVID)  [id number]  PREGNANCY, URINE    EKG None  Radiology No results found.  Procedures Procedures    Medications Ordered in ED Medications  naproxen (NAPROSYN) tablet 500 mg (500 mg Oral Given 04/23/23 2336)  acetaminophen (TYLENOL) tablet 1,000 mg (1,000 mg Oral Given 04/23/23 2336)  metoCLOPramide (REGLAN) tablet 10 mg (10 mg Oral Given 04/23/23 2336)  diphenhydrAMINE (BENADRYL) capsule 25 mg (25 mg Oral  Given 04/23/23 2336)    ED Course/ Medical Decision Making/ A&P                                 Medical Decision Making Patient with headache and body aches   Amount and/or Complexity of Data Reviewed External Data Reviewed: notes.    Details: Previous notes reviewed  Labs: ordered.    Details: Negative covid and flu, negative pregnancy test   Risk OTC drugs. Prescription drug management. Risk Details: Patient does not want injections.  So oral medications  were ordered.  Patient resting comfortably post medication. No signs of meningitis.  Very well appearing.  I do not believe this is an ICH or cavernous sinus thrombosis.  Given symptoms this is very likely a viral illness.   Stable for discharge     Final Clinical Impression(s) / ED Diagnoses Final diagnoses:  Other migraine without status migrainosus, not intractable    Return for intractable cough, coughing up blood, fevers > 100.4 unrelieved by medication, shortness of breath, intractable vomiting, chest pain, shortness of breath, weakness, numbness, changes in speech, facial asymmetry, abdominal pain, passing out, Inability to tolerate liquids or food, cough, altered mental status or any concerns. No signs of systemic illness or infection. The patient is nontoxic-appearing on exam and vital signs are within normal limits.  I have reviewed the triage vital signs and the nursing notes. Pertinent labs & imaging results that were available during my care of the patient were reviewed by me and considered in my medical decision making (see chart for details). After history, exam, and medical workup I feel the patient has been appropriately medically screened and is safe for discharge home. Pertinent diagnoses were discussed with the patient. Patient was given return precautions.      Rx / DC Orders ED Discharge Orders     None         Ivee Poellnitz, MD 04/24/23 [id number]

## 2023-04-29 DIAGNOSIS — Z6839 Body mass index (BMI) 39.0-39.9, adult: Secondary | ICD-10-CM | POA: Diagnosis not present

## 2023-04-29 DIAGNOSIS — N390 Urinary tract infection, site not specified: Secondary | ICD-10-CM | POA: Diagnosis not present

## 2023-04-29 DIAGNOSIS — R300 Dysuria: Secondary | ICD-10-CM | POA: Diagnosis not present

## 2023-04-29 DIAGNOSIS — A499 Bacterial infection, unspecified: Secondary | ICD-10-CM | POA: Diagnosis not present

## 2023-05-16 DIAGNOSIS — F419 Anxiety disorder, unspecified: Secondary | ICD-10-CM | POA: Diagnosis not present

## 2023-05-16 DIAGNOSIS — E6609 Other obesity due to excess calories: Secondary | ICD-10-CM | POA: Diagnosis not present

## 2023-05-16 DIAGNOSIS — M79661 Pain in right lower leg: Secondary | ICD-10-CM | POA: Diagnosis not present

## 2023-05-16 DIAGNOSIS — R600 Localized edema: Secondary | ICD-10-CM | POA: Diagnosis not present

## 2023-05-16 DIAGNOSIS — M79662 Pain in left lower leg: Secondary | ICD-10-CM | POA: Diagnosis not present

## 2023-05-16 DIAGNOSIS — Z6838 Body mass index (BMI) 38.0-38.9, adult: Secondary | ICD-10-CM | POA: Diagnosis not present

## 2023-07-13 ENCOUNTER — Telehealth: Payer: Self-pay | Admitting: Nurse Practitioner

## 2023-07-13 ENCOUNTER — Encounter: Payer: Self-pay | Admitting: Nurse Practitioner

## 2023-07-13 ENCOUNTER — Ambulatory Visit (INDEPENDENT_AMBULATORY_CARE_PROVIDER_SITE_OTHER): Payer: Medicaid Other | Admitting: Nurse Practitioner

## 2023-07-13 VITALS — BP 111/80 | HR 81 | Temp 98.2°F | Resp 18 | Ht 62.0 in | Wt 210.4 lb

## 2023-07-13 DIAGNOSIS — R197 Diarrhea, unspecified: Secondary | ICD-10-CM | POA: Diagnosis not present

## 2023-07-13 DIAGNOSIS — F411 Generalized anxiety disorder: Secondary | ICD-10-CM | POA: Diagnosis not present

## 2023-07-13 DIAGNOSIS — R1033 Periumbilical pain: Secondary | ICD-10-CM | POA: Diagnosis not present

## 2023-07-13 DIAGNOSIS — A048 Other specified bacterial intestinal infections: Secondary | ICD-10-CM | POA: Diagnosis not present

## 2023-07-13 MED ORDER — SERTRALINE HCL 50 MG PO TABS
50.0000 mg | ORAL_TABLET | Freq: Every day | ORAL | 5 refills | Status: AC
Start: 2023-07-13 — End: ?

## 2023-07-13 NOTE — Assessment & Plan Note (Addendum)
Onset 6months ago, constant, describes as pressure, associated with diarrhea 2x/days, no blood in stool, no GERD symptoms, no nausea. No use of oral antibiotic in last 6months. No ALCOHOL or tobacco or marijuana use Hx of cholelithiasis, she had surgical consult 32month ago but surgical intervention was not recommended. S/p liposuction 2022 by surgeon in [place]. CT ABDOMEN/pelvis 2023: Postsurgical changes in the abdominal wall attributed to previous liposuction. Mild diastasis of the rectus abdominus muscles with thinning of  the anterior abdominal wall and anterior protuberance. No discrete hernia. No acute intra-abdominal findings. Cholelithiasis.  States she had ABDOMEN US by [facility] 32month ago: report requested  Check H pylori, cbc, cmp, lipase Advised to modify diet to high fiber, low carb and low fat. F/up in 2weeks Ref to GI if no improvement and negative H.pylori

## 2023-07-13 NOTE — Assessment & Plan Note (Addendum)
Onset 50yrs ago, Describes restlessness, mood swings, disrupted sleep, excessive worry, night mares, uncontrollable crying when overwhelmed. Denies any hx of IVC, suicide attempt, SI or HI or hallucinations previous use of zoloft (25-50mg  x 2months) Current use of prozac (takes med prn with no improvement) No previous psychotherapy in past. Soda: 2cans of mountain dew daily No ALCOHOL or tobacco or marijuana use  Fhx of PTSD-brother  Advised about proper med dosing, need to eliminate caffeine consumption, heart healthy diet, exercise, and psychotherapy. Stop prazac. Start zoloft 50mg  daily Entered referral to psychology

## 2023-07-13 NOTE — Patient Instructions (Addendum)
Sign medical release form to get records from [facility]. Go to lab Start zoloft 50mg  daily Stop prozac Stop caffeine drinks Ok to use melatonin 5-10mg  at bedtime for sleep  High-Fiber Eating Plan Fiber, also called dietary fiber, is found in foods such as fruits, vegetables, whole grains, and beans. A high-fiber diet can be good for your health. Your health care provider may recommend a high-fiber diet to help: Prevent trouble pooping (constipation). Lower your cholesterol. Treat the following conditions: Hemorrhoids. This is inflammation of veins in the anus. Inflammation of specific areas of the digestive tract. Irritable bowel syndrome (IBS). This is a problem of the large intestine, also called the colon, that sometimes causes belly pain and bloating. Prevent overeating as part of a weight-loss plan. Lower the risk of heart disease, type 2 diabetes, and certain cancers. What are tips for following this plan? Reading food labels  Check the nutrition facts label on foods for the amount of dietary fiber. Choose foods that have 4 grams of fiber or more per serving. The recommended goals for how much fiber you should eat each day include: Males 69 years old or younger: 30-34 g. Males over 85 years old: 28-34 g. Females 80 years old or younger: 25-28 g. Females over 54 years old: 22-25 g. Your daily fiber goal is _____________ g. Shopping Choose whole fruits and vegetables instead of processed. For example, choose apples instead of apple juice or applesauce. Choose a variety of high-fiber foods such as avocados, lentils, oats, and pinto beans. Read the nutrition facts label on foods. Check for foods with added fiber. These foods often have high sugar and salt (sodium) amounts per serving. Cooking Use whole-grain flour for baking and cooking. Cook with brown rice instead of white rice. Make meals that have a lot of beans and vegetables in them, such as chili or vegetable-based  soups. Meal planning Start the day with a breakfast that is high in fiber, such as a cereal that has 5 g of fiber or more per serving. Eat breads and cereals that are made with whole-grain flour instead of refined flour or white flour. Eat brown rice, bulgur wheat, or millet instead of white rice. Use beans in place of meat in soups, salads, and pasta dishes. Be sure that half of the grains you eat each day are whole grains. General information You can get the recommended amount of dietary fiber by: Eating a variety of fruits, vegetables, grains, nuts, and beans. Taking a fiber supplement if you aren't able to eat enough fiber. It's better to get fiber through food than from a supplement. Slowly increase how much fiber you eat. If you increase the amount of fiber you eat too quickly, you may have bloating, cramping, or gas. Drink plenty of water to help you digest fiber. Choose high-fiber snacks, such as berries, raw vegetables, nuts, and popcorn. What foods should I eat? Fruits Berries. Pears. Apples. Oranges. Avocado. Prunes and raisins. Dried figs. Vegetables Sweet potatoes. Spinach. Kale. Artichokes. Cabbage. Broccoli. Cauliflower. Green peas. Carrots. Squash. Grains Whole-grain breads. Multigrain cereal. Oats and oatmeal. Brown rice. Barley. Bulgur wheat. Millet. Quinoa. Bran muffins. Popcorn. Rye wafer crackers. Meats and other proteins Navy beans, kidney beans, and pinto beans. Soybeans. Split peas. Lentils. Nuts and seeds. Dairy Fiber-fortified yogurt. Fortified means that fiber has been added to the product. Beverages Fiber-fortified soy milk. Fiber-fortified orange juice. Other foods Fiber bars. The items listed above may not be all the foods and drinks you can have.  Talk to a dietitian to learn more. What foods should I avoid? Fruits Fruit juice. Cooked, strained fruit. Vegetables Fried potatoes. Canned vegetables. Well-cooked vegetables. Grains White bread. Pasta made  with refined flour. White rice. Meats and other proteins Fatty meat. Fried chicken or fried fish. Dairy Milk. Cream cheese. Sour cream. Fats and oils Butters. Beverages Soft drinks. Other foods Cakes and pastries. The items listed above may not be all the foods and drinks you should avoid. Talk to a dietitian to learn more. This information is not intended to replace advice given to you by your health care provider. Make sure you discuss any questions you have with your health care provider. Document Revised: 11/14/2022 Document Reviewed: 11/14/2022 Elsevier Patient Education  2024 [employer].

## 2023-07-13 NOTE — Telephone Encounter (Signed)
See note

## 2023-07-13 NOTE — Progress Notes (Signed)
Established Patient Visit  Patient: Mia Wilkinson   DOB: October 07, 1985   37 y.o. Female  MRN: [id number] Visit Date: 07/13/2023  Subjective:    Chief Complaint  Patient presents with   Establish Care    ABDOMEN pain and Anxiety   HPI Transferred from [facility].  Periumbilical abdominal pain Onset 6months ago, constant, describes as pressure, associated with diarrhea 2x/days, no blood in stool, no GERD symptoms, no nausea. No use of oral antibiotic in last 6months. No ALCOHOL or tobacco or marijuana use Hx of cholelithiasis, she had surgical consult 42month ago but surgical intervention was not recommended. S/p liposuction 2022 by surgeon in [place]. CT ABDOMEN/pelvis 2023: Postsurgical changes in the abdominal wall attributed to previous liposuction. Mild diastasis of the rectus abdominus muscles with thinning of  the anterior abdominal wall and anterior protuberance. No discrete hernia. No acute intra-abdominal findings. Cholelithiasis.  States she had ABDOMEN [facility] by [facility] 42month ago: report requested  Check H pylori, cbc, cmp, lipase Advised to modify diet to high fiber, low carb and low fat. F/up in 2weeks Ref to GI if no improvement and negative H.pylori  GAD (generalized anxiety disorder) Onset 37yrs ago, Describes restlessness, mood swings, disrupted sleep, excessive worry, night mares, uncontrollable crying when overwhelmed. Denies any hx of IVC, suicide attempt, SI or HI or hallucinations previous use of zoloft (25-50mg  x 2months) Current use of prozac (takes med prn with no improvement) No previous psychotherapy in past. Soda: 2cans of mountain dew daily No ALCOHOL or tobacco or marijuana use  Fhx of PTSD-brother  Advised about proper med dosing, need to eliminate caffeine consumption, heart healthy diet, exercise, and psychotherapy. Stop prazac. Start zoloft 50mg  daily Entered referral to psychology  Reviewed medical, surgical, and  social history today  Medications: Outpatient Medications Prior to Visit  Medication Sig Note   butalbital-acetaminophen-caffeine (FIORICET) 50-325-40 MG tablet Take 1 tablet by mouth every 6 (six) hours as needed for headache. 07/13/2023: Refill needed   fluconazole (DIFLUCAN) 150 MG tablet Take 1 tablet (150 mg total) by mouth every 3 (three) days. 07/13/2023: Refill needed   fluocinonide-emollient (LIDEX-E) 0.05 % cream Apply 1 application topically 2 (two) times daily.    furosemide (LASIX) 20 MG tablet Take 20 mg by mouth every morning.    HYDROcodone-acetaminophen (NORCO/VICODIN) 5-325 MG tablet Take 1 tablet by mouth every 6 (six) hours as needed. 07/13/2023: Refill needed   sulfamethoxazole-trimethoprim (BACTRIM DS) 800-160 MG tablet Take 1 tablet by mouth 2 (two) times daily.    [DISCONTINUED] FLUoxetine (PROZAC) 20 MG capsule Take 20 mg by mouth daily.    [DISCONTINUED] ibuprofen (ADVIL) 600 MG tablet Take 1 tablet (600 mg total) by mouth every 6 (six) hours as needed. 07/13/2023: Refill needed   [DISCONTINUED] traZODone (DESYREL) 50 MG tablet Take 0.5-1 tablets (25-50 mg total) by mouth at bedtime as needed for sleep.    clotrimazole (LOTRIMIN) 1 % cream Apply 1 application topically 2 (two) times daily. (Patient not taking: Reported on 07/13/2023)    clotrimazole-betamethasone (LOTRISONE) cream Apply 1 application topically daily. Vaginal application daily for 2-3 days total. (Patient not taking: Reported on 07/13/2023)    FERROUSUL 325 (65 Fe) MG tablet Take 1 tablet (325 mg total) by mouth every other day.    phenazopyridine (PYRIDIUM) 200 MG tablet Take 200 mg by mouth 3 (three) times daily as needed. (Patient not taking: Reported on 07/13/2023)  Polyethylene Glycol 3350 POWD 17 g by Does not apply route daily as needed. (Patient not taking: Reported on 07/13/2023)    psyllium (REGULOID) 0.52 g capsule Take 1-2 capsules (0.52-1.04 g total) by mouth daily. (Patient not taking: Reported on  07/13/2023)    [DISCONTINUED] sertraline (ZOLOFT) 50 MG tablet Take 0.5 tablets (25 mg total) by mouth daily for 8 days, THEN 1 tablet (50 mg total) daily.    No facility-administered medications prior to visit.   Reviewed past medical and social history.   ROS per HPI above  Last CBC Lab Results  Component Value Date   WBC 6.7 01/25/2021   HGB 12.2 01/25/2021   HCT 38.2 01/25/2021   MCV 78.6 01/25/2021   MCH 25.3 (L) 03/31/2020   RDW 15.9 (H) 01/25/2021   PLT 154.0 01/25/2021   Last metabolic panel Lab Results  Component Value Date   GLUCOSE 86 01/25/2021   NA 140 01/25/2021   K 3.5 01/25/2021   CL 103 01/25/2021   CO2 28 01/25/2021   BUN 10 01/25/2021   CREATININE 0.69 01/25/2021   GFR 112.29 01/25/2021   CALCIUM 8.9 01/25/2021   PROT 8.2 01/25/2021   ALBUMIN 4.1 01/25/2021   LABGLOB 3.8 04/16/2019   AGRATIO 1.1 (L) 04/16/2019   BILITOT 0.3 01/25/2021   ALKPHOS 72 01/25/2021   AST 13 01/25/2021   ALT 14 01/25/2021   ANIONGAP 12 03/15/2020   Last thyroid functions Lab Results  Component Value Date   TSH 1.40 01/25/2021   T4TOTAL 8.8 04/16/2019        Objective:  BP 111/80 (BP Location: Left Arm, Patient Position: Sitting, Cuff Size: Normal)   Pulse 81   Temp 98.2 F (36.8 C) (Temporal)   Resp 18   Ht 5\' 2"  (1.575 m)   Wt 210 lb 6.4 oz (95.4 kg)   LMP 07/07/2023 (Exact Date) Comment: mentrual are irregular pt had 2 this month very heavy  SpO2 100%   Breastfeeding No   BMI 38.48 kg/m      Physical Exam Vitals and nursing note reviewed.  Cardiovascular:     Rate and Rhythm: Normal rate.     Pulses: Normal pulses.  Pulmonary:     Effort: Pulmonary effort is normal.  Abdominal:     General: Bowel sounds are normal. There is no distension.     Palpations: Abdomen is soft. There is no mass.     Tenderness: There is abdominal tenderness. There is no right CVA tenderness, left CVA tenderness or guarding.  Neurological:     Mental Status: She is  alert and oriented to person, place, and time.  Psychiatric:        Behavior: Behavior normal.        Thought Content: Thought content normal.     No results found for any visits on 07/13/23.    Assessment & Plan:    Problem List Items Addressed This Visit     Diarrhea   Relevant Orders   H. pylori breath test   TSH   Lipase   GAD (generalized anxiety disorder) - Primary    Onset 53yrs ago, Describes restlessness, mood swings, disrupted sleep, excessive worry, night mares, uncontrollable crying when overwhelmed. Denies any hx of IVC, suicide attempt, SI or HI or hallucinations previous use of zoloft (25-50mg  x 2months) Current use of prozac (takes med prn with no improvement) No previous psychotherapy in past. Soda: 2cans of mountain dew daily No ALCOHOL or tobacco or marijuana use  Fhx of PTSD-brother  Advised about proper med dosing, need to eliminate caffeine consumption, heart healthy diet, exercise, and psychotherapy. Stop prazac. Start zoloft 50mg  daily Entered referral to psychology      Relevant Medications   sertraline (ZOLOFT) 50 MG tablet   Other Relevant Orders   Ambulatory referral to Psychology   TSH   Periumbilical abdominal pain    Onset 6months ago, constant, describes as pressure, associated with diarrhea 2x/days, no blood in stool, no GERD symptoms, no nausea. No use of oral antibiotic in last 6months. No ALCOHOL or tobacco or marijuana use Hx of cholelithiasis, she had surgical consult 88month ago but surgical intervention was not recommended. S/p liposuction 2022 by surgeon in [place]. CT ABDOMEN/pelvis 2023: Postsurgical changes in the abdominal wall attributed to previous liposuction. Mild diastasis of the rectus abdominus muscles with thinning of  the anterior abdominal wall and anterior protuberance. No discrete hernia. No acute intra-abdominal findings. Cholelithiasis.  States she had ABDOMEN US by [facility]month ago: report  requested  Check H pylori, cbc, cmp, lipase Advised to modify diet to high fiber, low carb and low fat. F/up in 2weeks Ref to GI if no improvement and negative H.pylori      Relevant Orders   H. pylori breath test   CBC   Comprehensive metabolic panel   TSH   Lipase   Return in about 2 weeks (around 07/27/2023) for LE edema and menorrhagia.     Alysia Penna, NP

## 2023-07-13 NOTE — Addendum Note (Signed)
Addended by: Alysia Penna L on: 07/13/2023 03:17 PM   Modules accepted: Orders

## 2023-07-14 LAB — COMPREHENSIVE METABOLIC PANEL
ALT: 16 U/L (ref 0–35)
AST: 13 U/L (ref 0–37)
Albumin: 3.8 g/dL (ref 3.5–5.2)
Alkaline Phosphatase: 77 U/L (ref 39–117)
BUN: 10 mg/dL (ref 6–23)
CO2: 28 meq/L (ref 19–32)
Calcium: 8.6 mg/dL (ref 8.4–10.5)
Chloride: 103 meq/L (ref 96–112)
Creatinine, Ser: 0.78 mg/dL (ref 0.40–1.20)
GFR: 96.59 mL/min (ref >60.00)
Glucose, Bld: 99 mg/dL (ref 70–99)
Potassium: 3.5 meq/L (ref 3.5–5.1)
Sodium: 138 meq/L (ref 135–145)
Total Bilirubin: 0.2 mg/dL (ref 0.2–1.2)
Total Protein: 7.9 g/dL (ref 6.0–8.3)

## 2023-07-14 LAB — CBC
HCT: 34.1 % — ABNORMAL LOW (ref 36.0–46.0)
Hemoglobin: 10.5 g/dL — ABNORMAL LOW (ref 12.0–15.0)
MCHC: 30.8 g/dL (ref 30.0–36.0)
MCV: 75.1 fL — ABNORMAL LOW (ref 78.0–100.0)
Platelets: 244 10*3/uL (ref 150.0–400.0)
RBC: 4.54 Mil/uL (ref 3.87–5.11)
RDW: 18.6 % — ABNORMAL HIGH (ref 11.5–15.5)
WBC: 6.3 10*3/uL (ref 4.0–10.5)

## 2023-07-14 LAB — LIPASE: Lipase: 32 U/L (ref 11.0–59.0)

## 2023-07-14 LAB — TSH: TSH: 1.36 u[IU]/mL (ref 0.35–5.50)

## 2023-07-17 LAB — H. PYLORI BREATH TEST: H. pylori Breath Test: DETECTED — AB

## 2023-07-17 MED ORDER — PANTOPRAZOLE SODIUM 40 MG PO TBEC
40.0000 mg | DELAYED_RELEASE_TABLET | ORAL | 0 refills | Status: AC
Start: 2023-07-17 — End: ?

## 2023-07-17 MED ORDER — METRONIDAZOLE 500 MG PO TABS: 500.0000 mg | ORAL_TABLET | Freq: Two times a day (BID) | ORAL | 0 refills | Status: AC

## 2023-07-17 MED ORDER — CLARITHROMYCIN 500 MG PO TABS
500.0000 mg | ORAL_TABLET | Freq: Two times a day (BID) | ORAL | 0 refills | Status: AC
Start: 2023-07-17 — End: ?

## 2023-07-17 NOTE — Addendum Note (Signed)
Addended by: Michaela Corner on: 07/17/2023 03:32 PM   Modules accepted: Orders

## 2023-08-01 ENCOUNTER — Ambulatory Visit: Payer: Medicaid Other | Admitting: Nurse Practitioner

## 2023-08-01 ENCOUNTER — Telehealth: Payer: Self-pay | Admitting: Nurse Practitioner

## 2023-08-01 NOTE — Telephone Encounter (Signed)
NS no reason available

## 2023-08-08 NOTE — Telephone Encounter (Signed)
1st no show, letter sent via [facility]

## 2023-08-15 ENCOUNTER — Other Ambulatory Visit: Payer: Self-pay | Admitting: Nurse Practitioner

## 2023-08-15 ENCOUNTER — Encounter: Payer: Self-pay | Admitting: Nurse Practitioner

## 2023-08-15 ENCOUNTER — Ambulatory Visit: Payer: Medicaid Other | Admitting: Nurse Practitioner

## 2023-08-15 VITALS — BP 118/73 | HR 84 | Temp 98.0°F | Resp 18 | Wt 211.0 lb

## 2023-08-15 DIAGNOSIS — G44219 Episodic tension-type headache, not intractable: Secondary | ICD-10-CM

## 2023-08-15 DIAGNOSIS — A048 Other specified bacterial intestinal infections: Secondary | ICD-10-CM | POA: Diagnosis not present

## 2023-08-15 DIAGNOSIS — R600 Localized edema: Secondary | ICD-10-CM | POA: Diagnosis not present

## 2023-08-15 DIAGNOSIS — F411 Generalized anxiety disorder: Secondary | ICD-10-CM | POA: Diagnosis not present

## 2023-08-15 MED ORDER — BUTALBITAL-APAP-CAFFEINE 50-325-40 MG PO TABS
1.0000 | ORAL_TABLET | Freq: Four times a day (QID) | ORAL | 0 refills | Status: AC | PRN
Start: 2023-08-15 — End: ?

## 2023-08-15 MED ORDER — FUROSEMIDE 20 MG PO TABS
10.0000 mg | ORAL_TABLET | Freq: Every day | ORAL | 0 refills | Status: DC | PRN
Start: 2023-08-15 — End: 2023-09-21

## 2023-08-15 NOTE — Patient Instructions (Signed)
Maintain DASh diet and use of compression stocking to help with edema Also start daily exercise. Use furosemide as needed Use women's health probiotics 1cap daily to decrease vaginitis due to BV or yeast infection. May also use boric acid vaginal suppository as needed. Return stool sample to lab in 74month.

## 2023-08-15 NOTE — Assessment & Plan Note (Signed)
Improved and stable mood with zoloft She is still to schedule appointment with therapist  Maintain med dose F/up in 3months

## 2023-08-15 NOTE — Assessment & Plan Note (Signed)
Onset 3months ago, worse with prolonged standing and sitting. Furosemide 20mg  prescribed by another provided. No cough or SOB or PND.  No LE edema noted today Advised to maintain a DASH diet, daily exercise, and use of compression stocking. Furosemide 10mg  daily prn sent.

## 2023-08-15 NOTE — Assessment & Plan Note (Signed)
Chronic, onset 3yr ago, intermittent, describes as frontal throbbing pain, associated with dizziness and light sensitivity, denies any aura Trigger: stress No hx of head injury No Fhx of migraine Reports previous head CT by outside provider 76yrs ago-uanble to remember provider of location No improvement with tylenol 650mg  and ibuprofen OTC Relief with fioricet 1tab 2x/week and rest.  Fioricet refill sent

## 2023-08-15 NOTE — Assessment & Plan Note (Signed)
Resolved ABDOMEN pain and diarrhea She has completed clarithromycin, metronidazole and PPI x 14days.  Advised to repeat H pylori stool antigen in 65month

## 2023-08-15 NOTE — Progress Notes (Signed)
Established Patient Visit  Patient: Mia Wilkinson   DOB: 07-Apr-1986   37 y.o. Female  MRN: [id number] Visit Date: 08/15/2023  Subjective:    Chief Complaint  Patient presents with   OFFICE VISIT     2 week follow up LE edema, headache and intermittent vaginal discharge/itching. RX refill request for Fioricet, furosemide, and Diflucan   HPI H. pylori infection Resolved ABDOMEN pain and diarrhea She has completed clarithromycin, metronidazole and PPI x 14days.  Advised to repeat H pylori stool antigen in 76month  GAD (generalized anxiety disorder) Improved and stable mood with zoloft She is still to schedule appointment with therapist  Maintain med dose F/up in 3months  Headache Chronic, onset 29yr ago, intermittent, describes as frontal throbbing pain, associated with dizziness and light sensitivity, denies any aura Trigger: stress No hx of head injury No Fhx of migraine Reports previous head CT by outside provider 72yrs ago-uanble to remember provider of location No improvement with tylenol 650mg  and ibuprofen OTC Relief with fioricet 1tab 2x/week and rest.  Fioricet refill sent  Bilateral leg edema Onset 3months ago, worse with prolonged standing and sitting. Furosemide 20mg  prescribed by another provided. No cough or SOB or PND.  No LE edema noted today Advised to maintain a DASH diet, daily exercise, and use of compression stocking. Furosemide 10mg  daily prn sent.  Reviewed medical, surgical, and social history today  Medications: Outpatient Medications Prior to Visit  Medication Sig Note   clarithromycin (BIAXIN) 500 MG tablet Take 1 tablet (500 mg total) by mouth 2 (two) times daily. With food    clotrimazole-betamethasone (LOTRISONE) cream Apply 1 application topically daily. Vaginal application daily for 2-3 days total.    cyclobenzaprine (FLEXERIL) 10 MG tablet Take 10 mg by mouth 3 (three) times daily.    fluocinonide-emollient  (LIDEX-E) 0.05 % cream Apply 1 application topically 2 (two) times daily.    pantoprazole (PROTONIX) 40 MG tablet Take 1 tablet (40 mg total) by mouth every morning. before breakfast    phenazopyridine (PYRIDIUM) 200 MG tablet Take 200 mg by mouth 3 (three) times daily as needed.    psyllium (REGULOID) 0.52 g capsule Take 1-2 capsules (0.52-1.04 g total) by mouth daily.    sertraline (ZOLOFT) 50 MG tablet Take 1 tablet (50 mg total) by mouth daily.    sulfamethoxazole-trimethoprim (BACTRIM DS) 800-160 MG tablet Take 1 tablet by mouth 2 (two) times daily.    [DISCONTINUED] butalbital-acetaminophen-caffeine (FIORICET) 50-325-40 MG tablet Take 1 tablet by mouth every 6 (six) hours as needed for headache. 08/15/2023: Refill needed    [DISCONTINUED] fluconazole (DIFLUCAN) 150 MG tablet Take 1 tablet (150 mg total) by mouth every 3 (three) days. 08/15/2023: Refill needed    [DISCONTINUED] furosemide (LASIX) 20 MG tablet Take 20 mg by mouth every morning.    [DISCONTINUED] HYDROcodone-acetaminophen (NORCO/VICODIN) 5-325 MG tablet Take 1 tablet by mouth every 6 (six) hours as needed.    FERROUSUL 325 (65 Fe) MG tablet Take 1 tablet (325 mg total) by mouth every other day.    Polyethylene Glycol 3350 POWD 17 g by Does not apply route daily as needed. (Patient not taking: Reported on 08/15/2023)    [DISCONTINUED] clotrimazole (LOTRIMIN) 1 % cream Apply 1 application topically 2 (two) times daily. (Patient not taking: Reported on 08/15/2023)    No facility-administered medications prior to visit.   Reviewed past medical and social history.   ROS  per HPI above  Last CBC Lab Results  Component Value Date   WBC 6.3 07/13/2023   HGB 10.5 (L) 07/13/2023   HCT 34.1 (L) 07/13/2023   MCV 75.1 (L) 07/13/2023   MCH 25.3 (L) 03/31/2020   RDW 18.6 (H) 07/13/2023   PLT 244.0 07/13/2023   Last metabolic panel Lab Results  Component Value Date   GLUCOSE 99 07/13/2023   NA 138 07/13/2023   K 3.5  07/13/2023   CL 103 07/13/2023   CO2 28 07/13/2023   BUN 10 07/13/2023   CREATININE 0.78 07/13/2023   GFR 96.59 07/13/2023   CALCIUM 8.6 07/13/2023   PROT 7.9 07/13/2023   ALBUMIN 3.8 07/13/2023   LABGLOB 3.8 04/16/2019   AGRATIO 1.1 (L) 04/16/2019   BILITOT 0.2 07/13/2023   ALKPHOS 77 07/13/2023   AST 13 07/13/2023   ALT 16 07/13/2023   ANIONGAP 12 03/15/2020   Last thyroid functions Lab Results  Component Value Date   TSH 1.36 07/13/2023   T4TOTAL 8.8 04/16/2019        Objective:  BP 118/73 (BP Location: Left Arm, Patient Position: Sitting, Cuff Size: Large)   Pulse 84 Comment: done manual  Temp 98 F (36.7 C) (Temporal)   Resp 18   Wt 211 lb (95.7 kg)   LMP 08/10/2023 (Exact Date)   BMI 38.59 kg/m      Physical Exam Vitals and nursing note reviewed.  Cardiovascular:     Rate and Rhythm: Normal rate.     Pulses: Normal pulses.  Pulmonary:     Effort: Pulmonary effort is normal.  Musculoskeletal:     Right lower leg: No edema.     Left lower leg: No edema.  Neurological:     Mental Status: She is alert and oriented to person, place, and time.  Psychiatric:        Mood and Affect: Mood normal.        Behavior: Behavior normal.        Thought Content: Thought content normal.     No results found for any visits on 08/15/23.    Assessment & Plan:    Problem List Items Addressed This Visit     Bilateral leg edema    Onset 3months ago, worse with prolonged standing and sitting. Furosemide 20mg  prescribed by another provided. No cough or SOB or PND.  No LE edema noted today Advised to maintain a DASH diet, daily exercise, and use of compression stocking. Furosemide 10mg  daily prn sent.       Relevant Medications   furosemide (LASIX) 20 MG tablet   GAD (generalized anxiety disorder)    Improved and stable mood with zoloft She is still to schedule appointment with therapist  Maintain med dose F/up in 3months      H. pylori infection     Resolved ABDOMEN pain and diarrhea She has completed clarithromycin, metronidazole and PPI x 14days.  Advised to repeat H pylori stool antigen in 47month      Headache - Primary    Chronic, onset 44yr ago, intermittent, describes as frontal throbbing pain, associated with dizziness and light sensitivity, denies any aura Trigger: stress No hx of head injury No Fhx of migraine Reports previous head CT by outside provider 76yrs ago-uanble to remember provider of location No improvement with tylenol 650mg  and ibuprofen OTC Relief with fioricet 1tab 2x/week and rest.  Fioricet refill sent      Relevant Medications   cyclobenzaprine (FLEXERIL) 10 MG tablet  butalbital-acetaminophen-caffeine (FIORICET) 50-325-40 MG tablet   Return in about 3 months (around 11/13/2023) for anemia, depression and anxiety.     Alysia Penna, NP

## 2023-09-02 ENCOUNTER — Other Ambulatory Visit: Payer: Self-pay | Admitting: Nurse Practitioner

## 2023-09-20 ENCOUNTER — Other Ambulatory Visit: Payer: Self-pay | Admitting: Nurse Practitioner

## 2023-09-20 DIAGNOSIS — R600 Localized edema: Secondary | ICD-10-CM

## 2023-10-02 ENCOUNTER — Other Ambulatory Visit: Payer: Self-pay | Admitting: Nurse Practitioner

## 2023-10-02 DIAGNOSIS — R600 Localized edema: Secondary | ICD-10-CM

## 2023-11-07 DIAGNOSIS — B3731 Acute candidiasis of vulva and vagina: Secondary | ICD-10-CM | POA: Diagnosis not present

## 2023-11-29 DIAGNOSIS — N760 Acute vaginitis: Secondary | ICD-10-CM | POA: Diagnosis not present

## 2023-11-29 DIAGNOSIS — B9689 Other specified bacterial agents as the cause of diseases classified elsewhere: Secondary | ICD-10-CM | POA: Diagnosis not present

## 2023-11-29 DIAGNOSIS — Z7251 High risk heterosexual behavior: Secondary | ICD-10-CM | POA: Diagnosis not present

## 2023-11-29 DIAGNOSIS — Z6838 Body mass index (BMI) 38.0-38.9, adult: Secondary | ICD-10-CM | POA: Diagnosis not present

## 2023-11-29 DIAGNOSIS — R300 Dysuria: Secondary | ICD-10-CM | POA: Diagnosis not present

## 2023-11-29 DIAGNOSIS — Z30011 Encounter for initial prescription of contraceptive pills: Secondary | ICD-10-CM | POA: Diagnosis not present

## 2023-11-29 DIAGNOSIS — M545 Low back pain, unspecified: Secondary | ICD-10-CM | POA: Diagnosis not present

## 2024-01-16 DIAGNOSIS — M545 Low back pain, unspecified: Secondary | ICD-10-CM | POA: Diagnosis not present

## 2024-01-16 DIAGNOSIS — F5105 Insomnia due to other mental disorder: Secondary | ICD-10-CM | POA: Diagnosis not present

## 2024-01-16 DIAGNOSIS — Z79899 Other long term (current) drug therapy: Secondary | ICD-10-CM | POA: Diagnosis not present

## 2024-01-16 DIAGNOSIS — E7800 Pure hypercholesterolemia, unspecified: Secondary | ICD-10-CM | POA: Diagnosis not present

## 2024-01-16 DIAGNOSIS — R5383 Other fatigue: Secondary | ICD-10-CM | POA: Diagnosis not present

## 2024-01-16 DIAGNOSIS — E559 Vitamin D deficiency, unspecified: Secondary | ICD-10-CM | POA: Diagnosis not present

## 2024-01-16 DIAGNOSIS — F419 Anxiety disorder, unspecified: Secondary | ICD-10-CM | POA: Diagnosis not present

## 2024-01-16 DIAGNOSIS — Z6838 Body mass index (BMI) 38.0-38.9, adult: Secondary | ICD-10-CM | POA: Diagnosis not present

## 2024-01-16 DIAGNOSIS — R7303 Prediabetes: Secondary | ICD-10-CM | POA: Diagnosis not present

## 2024-01-16 DIAGNOSIS — R0602 Shortness of breath: Secondary | ICD-10-CM | POA: Diagnosis not present

## 2024-01-16 DIAGNOSIS — Z139 Encounter for screening, unspecified: Secondary | ICD-10-CM | POA: Diagnosis not present

## 2024-01-16 DIAGNOSIS — Z3041 Encounter for surveillance of contraceptive pills: Secondary | ICD-10-CM | POA: Diagnosis not present

## 2024-01-16 DIAGNOSIS — Z7251 High risk heterosexual behavior: Secondary | ICD-10-CM | POA: Diagnosis not present

## 2024-01-16 DIAGNOSIS — D539 Nutritional anemia, unspecified: Secondary | ICD-10-CM | POA: Diagnosis not present

## 2024-01-16 DIAGNOSIS — Z1159 Encounter for screening for other viral diseases: Secondary | ICD-10-CM | POA: Diagnosis not present

## 2024-01-16 DIAGNOSIS — Z0000 Encounter for general adult medical examination without abnormal findings: Secondary | ICD-10-CM | POA: Diagnosis not present

## 2024-05-29 DIAGNOSIS — R0789 Other chest pain: Secondary | ICD-10-CM | POA: Diagnosis not present

## 2024-05-29 DIAGNOSIS — R062 Wheezing: Secondary | ICD-10-CM | POA: Diagnosis not present

## 2024-05-29 DIAGNOSIS — B349 Viral infection, unspecified: Secondary | ICD-10-CM | POA: Diagnosis not present

## 2024-07-14 DIAGNOSIS — R1084 Generalized abdominal pain: Secondary | ICD-10-CM | POA: Diagnosis not present

## 2024-07-14 DIAGNOSIS — G43709 Chronic migraine without aura, not intractable, without status migrainosus: Secondary | ICD-10-CM | POA: Diagnosis not present

## 2024-07-14 DIAGNOSIS — Z6837 Body mass index (BMI) 37.0-37.9, adult: Secondary | ICD-10-CM | POA: Diagnosis not present

## 2024-07-14 DIAGNOSIS — R7303 Prediabetes: Secondary | ICD-10-CM | POA: Diagnosis not present

## 2024-07-14 DIAGNOSIS — Z3200 Encounter for pregnancy test, result unknown: Secondary | ICD-10-CM | POA: Diagnosis not present

## 2024-07-14 DIAGNOSIS — E7800 Pure hypercholesterolemia, unspecified: Secondary | ICD-10-CM | POA: Diagnosis not present

## 2024-07-24 DIAGNOSIS — R1084 Generalized abdominal pain: Secondary | ICD-10-CM | POA: Diagnosis not present

## 2024-07-24 DIAGNOSIS — G8929 Other chronic pain: Secondary | ICD-10-CM | POA: Diagnosis not present

## 2024-07-24 DIAGNOSIS — M5441 Lumbago with sciatica, right side: Secondary | ICD-10-CM | POA: Diagnosis not present

## 2024-07-24 DIAGNOSIS — M5442 Lumbago with sciatica, left side: Secondary | ICD-10-CM | POA: Diagnosis not present

## 2024-08-07 DIAGNOSIS — Z8719 Personal history of other diseases of the digestive system: Secondary | ICD-10-CM | POA: Diagnosis not present

## 2024-08-07 DIAGNOSIS — Z9889 Other specified postprocedural states: Secondary | ICD-10-CM | POA: Diagnosis not present

## 2024-08-07 DIAGNOSIS — K8020 Calculus of gallbladder without cholecystitis without obstruction: Secondary | ICD-10-CM | POA: Diagnosis not present

## 2024-08-22 DIAGNOSIS — R1084 Generalized abdominal pain: Secondary | ICD-10-CM | POA: Diagnosis not present

## 2024-08-22 DIAGNOSIS — G43009 Migraine without aura, not intractable, without status migrainosus: Secondary | ICD-10-CM | POA: Diagnosis not present

## 2024-08-22 DIAGNOSIS — M5441 Lumbago with sciatica, right side: Secondary | ICD-10-CM | POA: Diagnosis not present

## 2024-08-22 DIAGNOSIS — G8929 Other chronic pain: Secondary | ICD-10-CM | POA: Diagnosis not present

## 2024-08-22 DIAGNOSIS — M5442 Lumbago with sciatica, left side: Secondary | ICD-10-CM | POA: Diagnosis not present
